# Patient Record
Sex: Male | Born: 1940 | Race: White | Hispanic: No | Smoking: Current every day smoker
Health system: Southern US, Community
[De-identification: ages and names within clinical notes are randomized; demographics above are authoritative.]

## PROBLEM LIST (undated history)

## (undated) DIAGNOSIS — I25118 Atherosclerotic heart disease of native coronary artery with other forms of angina pectoris: Secondary | ICD-10-CM

## (undated) DIAGNOSIS — I509 Heart failure, unspecified: Secondary | ICD-10-CM

## (undated) DIAGNOSIS — E782 Mixed hyperlipidemia: Secondary | ICD-10-CM

## (undated) DIAGNOSIS — N400 Enlarged prostate without lower urinary tract symptoms: Secondary | ICD-10-CM

## (undated) DIAGNOSIS — J449 Chronic obstructive pulmonary disease, unspecified: Secondary | ICD-10-CM

## (undated) DIAGNOSIS — Z955 Presence of coronary angioplasty implant and graft: Secondary | ICD-10-CM

## (undated) DIAGNOSIS — G720 Drug-induced myopathy: Secondary | ICD-10-CM

## (undated) DIAGNOSIS — T466X5A Adverse effect of antihyperlipidemic and antiarteriosclerotic drugs, initial encounter: Secondary | ICD-10-CM

## (undated) DIAGNOSIS — I10 Essential (primary) hypertension: Secondary | ICD-10-CM

## (undated) DIAGNOSIS — F17200 Nicotine dependence, unspecified, uncomplicated: Secondary | ICD-10-CM

## (undated) HISTORY — DX: Atherosclerotic heart disease of native coronary artery with other forms of angina pectoris: I25.118

## (undated) HISTORY — DX: Chronic obstructive pulmonary disease, unspecified: J44.9

## (undated) HISTORY — DX: Presence of coronary angioplasty implant and graft: Z95.5

## (undated) HISTORY — PX: CORONARY ANGIOPLASTY WITH STENT PLACEMENT: SHX49

## (undated) HISTORY — DX: Nicotine dependence, unspecified, uncomplicated: F17.200

## (undated) HISTORY — DX: Drug-induced myopathy: G72.0

## (undated) HISTORY — PX: BLADDER TUMOR EXCISION: SHX238

## (undated) HISTORY — DX: Adverse effect of antihyperlipidemic and antiarteriosclerotic drugs, initial encounter: T46.6X5A

## (undated) HISTORY — PX: PROSTATE SURGERY: SHX751

## (undated) HISTORY — DX: Mixed hyperlipidemia: E78.2

## (undated) HISTORY — DX: Benign prostatic hyperplasia without lower urinary tract symptoms: N40.0

## (undated) HISTORY — PX: TONSILLECTOMY: SUR1361

## (undated) HISTORY — DX: Essential (primary) hypertension: I10

---
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
## 2016-11-02 DIAGNOSIS — N401 Enlarged prostate with lower urinary tract symptoms: Secondary | ICD-10-CM | POA: Diagnosis not present

## 2016-11-02 DIAGNOSIS — R8299 Other abnormal findings in urine: Secondary | ICD-10-CM | POA: Diagnosis not present

## 2016-11-03 DIAGNOSIS — I2510 Atherosclerotic heart disease of native coronary artery without angina pectoris: Secondary | ICD-10-CM | POA: Diagnosis not present

## 2016-11-03 DIAGNOSIS — I252 Old myocardial infarction: Secondary | ICD-10-CM | POA: Diagnosis not present

## 2016-11-03 DIAGNOSIS — J441 Chronic obstructive pulmonary disease with (acute) exacerbation: Secondary | ICD-10-CM | POA: Diagnosis not present

## 2016-11-03 DIAGNOSIS — J439 Emphysema, unspecified: Secondary | ICD-10-CM | POA: Diagnosis not present

## 2016-11-03 DIAGNOSIS — R0600 Dyspnea, unspecified: Secondary | ICD-10-CM | POA: Diagnosis present

## 2016-11-03 DIAGNOSIS — F17210 Nicotine dependence, cigarettes, uncomplicated: Secondary | ICD-10-CM | POA: Diagnosis present

## 2016-11-03 DIAGNOSIS — R55 Syncope and collapse: Secondary | ICD-10-CM | POA: Diagnosis not present

## 2016-11-03 DIAGNOSIS — I159 Secondary hypertension, unspecified: Secondary | ICD-10-CM | POA: Diagnosis not present

## 2016-11-03 DIAGNOSIS — E785 Hyperlipidemia, unspecified: Secondary | ICD-10-CM | POA: Diagnosis not present

## 2016-11-03 DIAGNOSIS — I25119 Atherosclerotic heart disease of native coronary artery with unspecified angina pectoris: Secondary | ICD-10-CM | POA: Diagnosis present

## 2016-11-03 DIAGNOSIS — Z955 Presence of coronary angioplasty implant and graft: Secondary | ICD-10-CM | POA: Diagnosis not present

## 2016-11-03 DIAGNOSIS — R51 Headache: Secondary | ICD-10-CM | POA: Diagnosis not present

## 2016-11-03 DIAGNOSIS — R0789 Other chest pain: Secondary | ICD-10-CM | POA: Diagnosis not present

## 2016-11-03 DIAGNOSIS — Z720 Tobacco use: Secondary | ICD-10-CM | POA: Diagnosis not present

## 2016-11-03 DIAGNOSIS — I160 Hypertensive urgency: Secondary | ICD-10-CM | POA: Diagnosis not present

## 2016-11-03 DIAGNOSIS — R791 Abnormal coagulation profile: Secondary | ICD-10-CM | POA: Diagnosis not present

## 2016-11-03 DIAGNOSIS — I10 Essential (primary) hypertension: Secondary | ICD-10-CM | POA: Diagnosis not present

## 2016-11-03 DIAGNOSIS — R079 Chest pain, unspecified: Secondary | ICD-10-CM | POA: Diagnosis not present

## 2016-11-03 DIAGNOSIS — Z743 Need for continuous supervision: Secondary | ICD-10-CM | POA: Diagnosis not present

## 2016-11-03 DIAGNOSIS — N390 Urinary tract infection, site not specified: Secondary | ICD-10-CM | POA: Diagnosis not present

## 2016-11-03 DIAGNOSIS — R0602 Shortness of breath: Secondary | ICD-10-CM | POA: Diagnosis not present

## 2016-11-03 DIAGNOSIS — R8299 Other abnormal findings in urine: Secondary | ICD-10-CM | POA: Diagnosis present

## 2016-11-03 DIAGNOSIS — R609 Edema, unspecified: Secondary | ICD-10-CM | POA: Diagnosis present

## 2016-11-03 DIAGNOSIS — J449 Chronic obstructive pulmonary disease, unspecified: Secondary | ICD-10-CM | POA: Diagnosis not present

## 2016-11-12 DIAGNOSIS — I10 Essential (primary) hypertension: Secondary | ICD-10-CM | POA: Diagnosis not present

## 2016-11-12 DIAGNOSIS — E785 Hyperlipidemia, unspecified: Secondary | ICD-10-CM | POA: Diagnosis not present

## 2016-11-12 DIAGNOSIS — D494 Neoplasm of unspecified behavior of bladder: Secondary | ICD-10-CM | POA: Diagnosis not present

## 2016-11-16 DIAGNOSIS — E785 Hyperlipidemia, unspecified: Secondary | ICD-10-CM | POA: Diagnosis not present

## 2016-11-16 DIAGNOSIS — I213 ST elevation (STEMI) myocardial infarction of unspecified site: Secondary | ICD-10-CM | POA: Diagnosis not present

## 2016-11-16 DIAGNOSIS — I10 Essential (primary) hypertension: Secondary | ICD-10-CM | POA: Diagnosis not present

## 2016-11-16 DIAGNOSIS — I2510 Atherosclerotic heart disease of native coronary artery without angina pectoris: Secondary | ICD-10-CM | POA: Diagnosis not present

## 2016-12-04 DIAGNOSIS — Z955 Presence of coronary angioplasty implant and graft: Secondary | ICD-10-CM | POA: Diagnosis not present

## 2016-12-04 DIAGNOSIS — R0989 Other specified symptoms and signs involving the circulatory and respiratory systems: Secondary | ICD-10-CM | POA: Diagnosis not present

## 2016-12-04 DIAGNOSIS — Z79899 Other long term (current) drug therapy: Secondary | ICD-10-CM | POA: Diagnosis not present

## 2016-12-04 DIAGNOSIS — N401 Enlarged prostate with lower urinary tract symptoms: Secondary | ICD-10-CM | POA: Diagnosis not present

## 2016-12-04 DIAGNOSIS — J449 Chronic obstructive pulmonary disease, unspecified: Secondary | ICD-10-CM | POA: Diagnosis not present

## 2016-12-04 DIAGNOSIS — J984 Other disorders of lung: Secondary | ICD-10-CM | POA: Diagnosis not present

## 2016-12-04 DIAGNOSIS — I2510 Atherosclerotic heart disease of native coronary artery without angina pectoris: Secondary | ICD-10-CM | POA: Diagnosis present

## 2016-12-04 DIAGNOSIS — I10 Essential (primary) hypertension: Secondary | ICD-10-CM | POA: Diagnosis not present

## 2016-12-04 DIAGNOSIS — N411 Chronic prostatitis: Secondary | ICD-10-CM | POA: Diagnosis not present

## 2016-12-18 DIAGNOSIS — R310 Gross hematuria: Secondary | ICD-10-CM | POA: Diagnosis not present

## 2016-12-18 DIAGNOSIS — N400 Enlarged prostate without lower urinary tract symptoms: Secondary | ICD-10-CM | POA: Diagnosis not present

## 2016-12-18 DIAGNOSIS — R8299 Other abnormal findings in urine: Secondary | ICD-10-CM | POA: Diagnosis not present

## 2016-12-24 DIAGNOSIS — N400 Enlarged prostate without lower urinary tract symptoms: Secondary | ICD-10-CM | POA: Diagnosis not present

## 2016-12-24 DIAGNOSIS — R310 Gross hematuria: Secondary | ICD-10-CM | POA: Diagnosis not present

## 2016-12-24 DIAGNOSIS — R8299 Other abnormal findings in urine: Secondary | ICD-10-CM | POA: Diagnosis not present

## 2016-12-24 DIAGNOSIS — N401 Enlarged prostate with lower urinary tract symptoms: Secondary | ICD-10-CM | POA: Diagnosis not present

## 2017-01-07 DIAGNOSIS — Z716 Tobacco abuse counseling: Secondary | ICD-10-CM | POA: Diagnosis not present

## 2017-01-07 DIAGNOSIS — Z720 Tobacco use: Secondary | ICD-10-CM | POA: Diagnosis not present

## 2017-01-07 DIAGNOSIS — F17210 Nicotine dependence, cigarettes, uncomplicated: Secondary | ICD-10-CM | POA: Diagnosis not present

## 2017-01-07 DIAGNOSIS — J449 Chronic obstructive pulmonary disease, unspecified: Secondary | ICD-10-CM | POA: Diagnosis not present

## 2017-01-07 DIAGNOSIS — R0602 Shortness of breath: Secondary | ICD-10-CM | POA: Diagnosis not present

## 2017-01-07 DIAGNOSIS — J441 Chronic obstructive pulmonary disease with (acute) exacerbation: Secondary | ICD-10-CM | POA: Diagnosis not present

## 2017-01-07 DIAGNOSIS — I10 Essential (primary) hypertension: Secondary | ICD-10-CM | POA: Diagnosis not present

## 2017-01-29 DIAGNOSIS — I10 Essential (primary) hypertension: Secondary | ICD-10-CM | POA: Diagnosis not present

## 2017-01-29 DIAGNOSIS — J309 Allergic rhinitis, unspecified: Secondary | ICD-10-CM | POA: Diagnosis not present

## 2017-01-29 DIAGNOSIS — J439 Emphysema, unspecified: Secondary | ICD-10-CM | POA: Diagnosis not present

## 2017-03-12 DIAGNOSIS — R0600 Dyspnea, unspecified: Secondary | ICD-10-CM | POA: Diagnosis not present

## 2017-03-12 DIAGNOSIS — R0602 Shortness of breath: Secondary | ICD-10-CM | POA: Diagnosis not present

## 2017-03-12 DIAGNOSIS — I10 Essential (primary) hypertension: Secondary | ICD-10-CM | POA: Diagnosis not present

## 2017-03-12 DIAGNOSIS — J441 Chronic obstructive pulmonary disease with (acute) exacerbation: Secondary | ICD-10-CM | POA: Diagnosis not present

## 2017-03-12 DIAGNOSIS — R030 Elevated blood-pressure reading, without diagnosis of hypertension: Secondary | ICD-10-CM | POA: Diagnosis not present

## 2017-03-12 DIAGNOSIS — Z743 Need for continuous supervision: Secondary | ICD-10-CM | POA: Diagnosis not present

## 2017-03-12 DIAGNOSIS — F17210 Nicotine dependence, cigarettes, uncomplicated: Secondary | ICD-10-CM | POA: Diagnosis not present

## 2017-03-13 DIAGNOSIS — R5383 Other fatigue: Secondary | ICD-10-CM | POA: Diagnosis not present

## 2017-03-13 DIAGNOSIS — R5381 Other malaise: Secondary | ICD-10-CM | POA: Diagnosis not present

## 2017-03-13 DIAGNOSIS — R0981 Nasal congestion: Secondary | ICD-10-CM | POA: Diagnosis not present

## 2017-03-13 DIAGNOSIS — R0602 Shortness of breath: Secondary | ICD-10-CM | POA: Diagnosis not present

## 2017-03-13 DIAGNOSIS — R0989 Other specified symptoms and signs involving the circulatory and respiratory systems: Secondary | ICD-10-CM | POA: Diagnosis not present

## 2017-03-13 DIAGNOSIS — J439 Emphysema, unspecified: Secondary | ICD-10-CM | POA: Diagnosis not present

## 2017-03-22 DIAGNOSIS — I10 Essential (primary) hypertension: Secondary | ICD-10-CM | POA: Diagnosis not present

## 2017-03-22 DIAGNOSIS — E785 Hyperlipidemia, unspecified: Secondary | ICD-10-CM | POA: Diagnosis not present

## 2017-03-22 DIAGNOSIS — J40 Bronchitis, not specified as acute or chronic: Secondary | ICD-10-CM | POA: Diagnosis not present

## 2017-03-22 DIAGNOSIS — I2510 Atherosclerotic heart disease of native coronary artery without angina pectoris: Secondary | ICD-10-CM | POA: Diagnosis not present

## 2017-05-04 DIAGNOSIS — Z743 Need for continuous supervision: Secondary | ICD-10-CM | POA: Diagnosis not present

## 2017-05-04 DIAGNOSIS — J441 Chronic obstructive pulmonary disease with (acute) exacerbation: Secondary | ICD-10-CM | POA: Diagnosis not present

## 2017-05-04 DIAGNOSIS — R0600 Dyspnea, unspecified: Secondary | ICD-10-CM | POA: Diagnosis not present

## 2017-05-04 DIAGNOSIS — Z720 Tobacco use: Secondary | ICD-10-CM | POA: Diagnosis not present

## 2017-05-04 DIAGNOSIS — R0602 Shortness of breath: Secondary | ICD-10-CM | POA: Diagnosis not present

## 2017-05-04 DIAGNOSIS — I252 Old myocardial infarction: Secondary | ICD-10-CM | POA: Diagnosis not present

## 2017-05-04 DIAGNOSIS — R071 Chest pain on breathing: Secondary | ICD-10-CM | POA: Diagnosis not present

## 2017-05-04 DIAGNOSIS — I10 Essential (primary) hypertension: Secondary | ICD-10-CM | POA: Diagnosis not present

## 2017-09-30 DIAGNOSIS — J439 Emphysema, unspecified: Secondary | ICD-10-CM | POA: Diagnosis not present

## 2017-09-30 DIAGNOSIS — I10 Essential (primary) hypertension: Secondary | ICD-10-CM | POA: Diagnosis not present

## 2017-12-25 DIAGNOSIS — I10 Essential (primary) hypertension: Secondary | ICD-10-CM | POA: Diagnosis not present

## 2017-12-25 DIAGNOSIS — J439 Emphysema, unspecified: Secondary | ICD-10-CM | POA: Diagnosis not present

## 2017-12-25 DIAGNOSIS — R350 Frequency of micturition: Secondary | ICD-10-CM | POA: Diagnosis not present

## 2018-01-22 DIAGNOSIS — Z13220 Encounter for screening for lipoid disorders: Secondary | ICD-10-CM | POA: Diagnosis not present

## 2018-01-22 DIAGNOSIS — R5381 Other malaise: Secondary | ICD-10-CM | POA: Diagnosis not present

## 2018-01-22 DIAGNOSIS — J439 Emphysema, unspecified: Secondary | ICD-10-CM | POA: Diagnosis not present

## 2018-01-22 DIAGNOSIS — N401 Enlarged prostate with lower urinary tract symptoms: Secondary | ICD-10-CM | POA: Diagnosis not present

## 2018-01-22 DIAGNOSIS — K068 Other specified disorders of gingiva and edentulous alveolar ridge: Secondary | ICD-10-CM | POA: Diagnosis not present

## 2018-01-22 DIAGNOSIS — R5383 Other fatigue: Secondary | ICD-10-CM | POA: Diagnosis not present

## 2018-01-22 DIAGNOSIS — K921 Melena: Secondary | ICD-10-CM | POA: Diagnosis not present

## 2018-01-22 DIAGNOSIS — I10 Essential (primary) hypertension: Secondary | ICD-10-CM | POA: Diagnosis not present

## 2018-01-30 DIAGNOSIS — I10 Essential (primary) hypertension: Secondary | ICD-10-CM | POA: Diagnosis not present

## 2018-01-30 DIAGNOSIS — I442 Atrioventricular block, complete: Secondary | ICD-10-CM | POA: Diagnosis not present

## 2018-01-30 DIAGNOSIS — R079 Chest pain, unspecified: Secondary | ICD-10-CM | POA: Diagnosis not present

## 2018-01-30 DIAGNOSIS — I2510 Atherosclerotic heart disease of native coronary artery without angina pectoris: Secondary | ICD-10-CM | POA: Diagnosis not present

## 2018-02-18 DIAGNOSIS — Z9981 Dependence on supplemental oxygen: Secondary | ICD-10-CM | POA: Diagnosis not present

## 2018-02-18 DIAGNOSIS — R918 Other nonspecific abnormal finding of lung field: Secondary | ICD-10-CM | POA: Diagnosis not present

## 2018-02-18 DIAGNOSIS — J181 Lobar pneumonia, unspecified organism: Secondary | ICD-10-CM | POA: Diagnosis not present

## 2018-02-18 DIAGNOSIS — J449 Chronic obstructive pulmonary disease, unspecified: Secondary | ICD-10-CM | POA: Diagnosis not present

## 2018-02-18 DIAGNOSIS — R0602 Shortness of breath: Secondary | ICD-10-CM | POA: Diagnosis not present

## 2018-02-18 DIAGNOSIS — R0600 Dyspnea, unspecified: Secondary | ICD-10-CM | POA: Diagnosis not present

## 2018-02-24 DIAGNOSIS — R911 Solitary pulmonary nodule: Secondary | ICD-10-CM | POA: Diagnosis not present

## 2018-02-24 DIAGNOSIS — J439 Emphysema, unspecified: Secondary | ICD-10-CM | POA: Diagnosis not present

## 2018-02-24 DIAGNOSIS — D6859 Other primary thrombophilia: Secondary | ICD-10-CM | POA: Diagnosis not present

## 2018-02-24 DIAGNOSIS — R195 Other fecal abnormalities: Secondary | ICD-10-CM | POA: Diagnosis not present

## 2018-03-26 DIAGNOSIS — R911 Solitary pulmonary nodule: Secondary | ICD-10-CM | POA: Diagnosis not present

## 2018-04-02 DIAGNOSIS — R3912 Poor urinary stream: Secondary | ICD-10-CM | POA: Diagnosis not present

## 2018-04-02 DIAGNOSIS — R34 Anuria and oliguria: Secondary | ICD-10-CM | POA: Diagnosis not present

## 2018-04-20 DIAGNOSIS — I2510 Atherosclerotic heart disease of native coronary artery without angina pectoris: Secondary | ICD-10-CM | POA: Diagnosis not present

## 2018-04-20 DIAGNOSIS — J449 Chronic obstructive pulmonary disease, unspecified: Secondary | ICD-10-CM | POA: Diagnosis not present

## 2018-04-20 DIAGNOSIS — Z9981 Dependence on supplemental oxygen: Secondary | ICD-10-CM | POA: Diagnosis not present

## 2018-04-20 DIAGNOSIS — R0602 Shortness of breath: Secondary | ICD-10-CM | POA: Diagnosis not present

## 2018-04-20 DIAGNOSIS — I252 Old myocardial infarction: Secondary | ICD-10-CM | POA: Diagnosis not present

## 2018-04-20 DIAGNOSIS — F17218 Nicotine dependence, cigarettes, with other nicotine-induced disorders: Secondary | ICD-10-CM | POA: Diagnosis not present

## 2018-04-20 DIAGNOSIS — Z743 Need for continuous supervision: Secondary | ICD-10-CM | POA: Diagnosis not present

## 2018-04-20 DIAGNOSIS — R079 Chest pain, unspecified: Secondary | ICD-10-CM | POA: Diagnosis not present

## 2018-04-20 DIAGNOSIS — J441 Chronic obstructive pulmonary disease with (acute) exacerbation: Secondary | ICD-10-CM | POA: Diagnosis not present

## 2018-08-26 ENCOUNTER — Encounter: Payer: Self-pay | Admitting: Osteopathic Medicine

## 2018-08-26 ENCOUNTER — Ambulatory Visit (INDEPENDENT_AMBULATORY_CARE_PROVIDER_SITE_OTHER): Payer: Medicare Other | Admitting: Osteopathic Medicine

## 2018-08-26 VITALS — BP 179/75 | HR 70 | Temp 98.1°F | Ht 69.0 in | Wt 186.0 lb

## 2018-08-26 DIAGNOSIS — E782 Mixed hyperlipidemia: Secondary | ICD-10-CM

## 2018-08-26 DIAGNOSIS — Z955 Presence of coronary angioplasty implant and graft: Secondary | ICD-10-CM | POA: Diagnosis not present

## 2018-08-26 DIAGNOSIS — R918 Other nonspecific abnormal finding of lung field: Secondary | ICD-10-CM | POA: Diagnosis not present

## 2018-08-26 DIAGNOSIS — T466X5A Adverse effect of antihyperlipidemic and antiarteriosclerotic drugs, initial encounter: Secondary | ICD-10-CM | POA: Diagnosis not present

## 2018-08-26 DIAGNOSIS — F17200 Nicotine dependence, unspecified, uncomplicated: Secondary | ICD-10-CM | POA: Insufficient documentation

## 2018-08-26 DIAGNOSIS — J449 Chronic obstructive pulmonary disease, unspecified: Secondary | ICD-10-CM

## 2018-08-26 DIAGNOSIS — J4489 Other specified chronic obstructive pulmonary disease: Secondary | ICD-10-CM | POA: Insufficient documentation

## 2018-08-26 DIAGNOSIS — I25118 Atherosclerotic heart disease of native coronary artery with other forms of angina pectoris: Secondary | ICD-10-CM | POA: Insufficient documentation

## 2018-08-26 DIAGNOSIS — I10 Essential (primary) hypertension: Secondary | ICD-10-CM | POA: Diagnosis not present

## 2018-08-26 DIAGNOSIS — R911 Solitary pulmonary nodule: Secondary | ICD-10-CM | POA: Insufficient documentation

## 2018-08-26 DIAGNOSIS — H029 Unspecified disorder of eyelid: Secondary | ICD-10-CM | POA: Diagnosis not present

## 2018-08-26 DIAGNOSIS — G720 Drug-induced myopathy: Secondary | ICD-10-CM | POA: Diagnosis not present

## 2018-08-26 HISTORY — DX: Other specified chronic obstructive pulmonary disease: J44.89

## 2018-08-26 HISTORY — DX: Presence of coronary angioplasty implant and graft: Z95.5

## 2018-08-26 HISTORY — DX: Essential (primary) hypertension: I10

## 2018-08-26 HISTORY — DX: Chronic obstructive pulmonary disease, unspecified: J44.9

## 2018-08-26 HISTORY — DX: Atherosclerotic heart disease of native coronary artery with other forms of angina pectoris: I25.118

## 2018-08-26 HISTORY — DX: Mixed hyperlipidemia: E78.2

## 2018-08-26 HISTORY — DX: Nicotine dependence, unspecified, uncomplicated: F17.200

## 2018-08-26 HISTORY — DX: Drug-induced myopathy: T46.6X5A

## 2018-08-26 HISTORY — DX: Drug-induced myopathy: G72.0

## 2018-08-26 MED ORDER — CARVEDILOL 12.5 MG PO TABS: 12.5000 mg | ORAL_TABLET | Freq: Two times a day (BID) | ORAL | 3 refills | Status: DC

## 2018-08-26 MED ORDER — FUROSEMIDE 20 MG PO TABS: 20.0000 mg | ORAL_TABLET | Freq: Every day | ORAL | 3 refills | Status: DC

## 2018-08-26 NOTE — Patient Instructions (Addendum)
Plan:  CT chest ordered, when you go in for the scan please being your disc form your previous imaging study   Referral to cardiology   Referral to ophthalmology (eye doctor for eyelid)   Refills taken care of for furosemide and carvedilol  When due for refills of other medications, please contact your pharmacy to send [place] a request, call us if there are any problems with this process  I'll review records this week and if we need to make any updates, I'll let you know!   Next time you see me or the cardiologist, please being your home blood pressure monitor to that visit soit can be verified

## 2018-08-26 NOTE — Progress Notes (Signed)
HPI: Christopher Armstrong is a 77 y.o. male who  has no past medical history on file.  he presents to [facility] today, 08/26/18,  for chief complaint of: New to establish care - see headings below  COPD Declines flu vaccine, pneumonia vaccines Takes Symbicort 160 mg / 4.5 mg 2 puffs twice daily, ipratropium/albuterol nebulizer 4 times daily and as needed, Ventolin emergency inhaler as needed, 2 L oxygen as needed/sleeping.    Cardiac: HTN, HLD, CAD  History of MI Reports whitecoat hypertension Current medications include: Prasugrel 10 mg daily, furosemide 20 mg daily, carvedilol 12.5 mg twice daily, Nitrostat 0.4 mg as needed  Reports a history of statin intolerance, was also previously on Plavix which caused "purple blood pockets in the mouth", Brilinta caused breathing problems  Spot on R eyelid - would like this checked out and possibly removed.    Patient is accompanied by sister, Malachy Mood, who assists with history-taking.     Past medical, surgical, social and family history reviewed:  Patient Active Problem List   Diagnosis Date Noted  . COPD (chronic obstructive pulmonary disease) with chronic bronchitis ([place]) 08/26/2018  . Coronary artery disease of native artery of native heart with stable angina pectoris ([place]) 08/26/2018  . Mixed hyperlipidemia 08/26/2018  . Essential hypertension 08/26/2018  . 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 Tobacco use disorder, severe, dependence 08/26/2018  . History of coronary artery stent placement 08/26/2018  . White coat syndrome with hypertension 08/26/2018  . Eyelid abnormality 08/26/2018  . Lung nodule 08/26/2018  . Abnormal findings on diagnostic imaging of lung 08/26/2018    Past Surgical History:  Procedure Laterality Date  . BLADDER TUMOR EXCISION    . CORONARY ANGIOPLASTY WITH STENT PLACEMENT    . TONSILLECTOMY      Social History   Tobacco Use  . Smoking status: Current Every Day Smoker  .  Smokeless tobacco: Never Used  Substance Use Topics  . Alcohol use: Yes    Alcohol/week: 1.0 standard drinks    Types: 1 Cans of beer per week    No family history on file.   Current medication list and allergy/intolerance information reviewed:    Current Outpatient Medications  Medication Sig Dispense Refill  . albuterol (PROVENTIL HFA;VENTOLIN HFA) 108 (90 Base) MCG/ACT inhaler Inhale into the lungs every 6 (six) hours as needed for wheezing or shortness of breath.    . budesonide-formoterol (SYMBICORT) 160-4.5 MCG/ACT inhaler Inhale 2 puffs into the lungs 2 (two) times daily.    . carvedilol (COREG) 12.5 MG tablet Take 1 tablet (12.5 mg total) by mouth 2 (two) times daily. 180 tablet 3  . furosemide (LASIX) 20 MG tablet Take 1 tablet (20 mg total) by mouth daily. 90 tablet 3  . ipratropium-albuterol (DUONEB) 0.5-2.5 (3) MG/3ML SOLN Take 3 mLs by nebulization 4 (four) times daily.    . nitroGLYCERIN (NITROSTAT) 0.4 MG SL tablet Place 0.4 mg under the tongue every 5 (five) minutes as needed for chest pain.    . OXYGEN Inhale into the lungs at bedtime.    . prasugrel (EFFIENT) 10 MG TABS tablet Take 10 mg by mouth daily.     No current facility-administered medications for this visit.     Allergies  Allergen Reactions  . Plavix [Clopidogrel] Other (See Comments)    "Purple blood pockets clots in mouth"  . Ticagrelor Shortness Of Breath  . Atorvastatin   . Simvastatin       Review of Systems:  Constitutional:  No  fever, no chills, No recent illness, No unintentional weight changes. No significant fatigue.   HEENT: No  headache, no vision change, no hearing change, No sore throat, No  sinus pressure  Cardiac: No  chest pain, No  pressure, No palpitations, No  Orthopnea, +Le swelling   Respiratory:  +chronic cough and shortness of breath w/ occasional productive cough  Gastrointestinal: No  abdominal pain, No  nausea, No  vomiting,  No  blood in stool, No  diarrhea, No   constipation   Musculoskeletal: No new myalgia/arthralgia  Skin: No  Rash, No other wounds/concerning lesions  Genitourinary: No  incontinence, No  abnormal genital bleeding, No abnormal genital discharge  Hem/Onc: +easy bruising/bleeding, No  abnormal lymph node  Endocrine: No cold intolerance,  No heat intolerance. No polyuria/polydipsia/polyphagia   Neurologic: No  weakness, No  dizziness, No  slurred speech/focal weakness/facial droop  Psychiatric: No  concerns with depression, No  concerns with anxiety, No sleep problems, No mood problems  Exam:  BP (!) 179/75 (BP Location: Left Arm, Patient Position: Sitting, Cuff Size: Normal)   Pulse 70   Temp 98.1 F (36.7 C) (Oral)   Ht 5\' 9"  (1.753 m)   Wt 186 lb (84.4 kg)   SpO2 96%   BMI 27.47 kg/m   Constitutional: VS see above. General Appearance: alert, well-developed, well-nourished, NAD  Eyes: Normal conjunctive, non-icteric sclera, small reddish nodule R lower eyelid at lashes, <0.5 cm, not consistent w/ stue/hordeolum   Ears, Nose, Mouth, Throat: MMM, Normal external inspection ears/nares/mouth/lips/gums.   Neck: No masses, trachea midline. No thyroid enlargement. No tenderness/mass appreciated. No lymphadenopathy  Respiratory: Normal respiratory effort. no wheeze, no rhonchi, no rales, coarse breath sounds and diminished sounds bilaterally   Cardiovascular: S1/S2 normal, +faint systolic murmur, no rub/gallop auscultated. RRR. No lower extremity edema.   Gastrointestinal: Nontender, no masses. No hepatomegaly, no splenomegaly. No hernia appreciated. Bowel sounds normal. Rectal exam deferred.   Musculoskeletal: Gait normal. No clubbing/cyanosis of digits.   Neurological: Normal balance/coordination. No tremor. No cranial nerve deficit on limited exam. Motor and sensation intact and symmetric. Cerebellar reflexes intact.   Skin: warm, dry, intact. No rash/ulcer. No concerning nevi or subq nodules on limited exam.     Psychiatric: Normal judgment/insight. Normal mood and affect. Oriented x3.       ASSESSMENT/PLAN:   Coronary artery disease of native artery of native heart with stable angina pectoris ([place]) - Plan: Ambulatory referral to Cardiology  COPD (chronic obstructive pulmonary disease) with chronic bronchitis ([place])  Mixed hyperlipidemia - Plan: Ambulatory referral to Cardiology  Essential hypertension - Plan: Ambulatory referral to Cardiology  Statin myopathy - Plan: Ambulatory referral to Cardiology  Tobacco use disorder, severe, dependence  History of coronary artery stent placement - Plan: Ambulatory referral to Cardiology  Abnormal findings on diagnostic imaging of lung - Plan: CT Chest Wo Contrast  Lung nodule - Plan: CT Chest Wo Contrast  Eyelid abnormality - Plan: Ambulatory referral to Ophthalmology  White coat syndrome with hypertension    Orders Placed This Encounter  Procedures  . CT Chest Wo Contrast  . Ambulatory referral to Ophthalmology  . Ambulatory referral to Cardiology    Meds ordered this encounter  Medications  . furosemide (LASIX) 20 MG tablet    Sig: Take 1 tablet (20 mg total) by mouth daily.    Dispense:  90 tablet    Refill:  3  . carvedilol (COREG) 12.5 MG tablet    Sig:  Take 1 tablet (12.5 mg total) by mouth 2 (two) times daily.    Dispense:  180 tablet    Refill:  3    Patient Instructions  Plan:  CT chest ordered, when you go in for the scan please being your disc form your previous imaging study   Referral to cardiology   Referral to ophthalmology (eye doctor for eyelid)   Refills taken care of for furosemide and carvedilol  When due for refills of other medications, please contact your pharmacy to send [place] a request, call us if there are any problems with this process  I'll review records this week and if we need to make any updates, I'll let you know!   Next time you see me or the cardiologist, please being your home blood  pressure monitor to that visit soit can be verified             Visit summary with medication list and pertinent instructions was printed for patient to review. All questions at time of visit were answered - patient instructed to contact office with any additional concerns. ER/RTC precautions were reviewed with the patient.   Follow-up plan: Return in about 3 months (around 11/26/2018), or sooner if needed, for recheck cardiac and respiratory issues - if stable can see me every 6 months / as needed after that .  Note: Total time spent 60 minutes, greater than 50% of the visit was spent face-to-face counseling and coordinating care for the following: The primary encounter diagnosis was Coronary artery disease of native artery of native heart with stable angina pectoris ([place]). Diagnoses of COPD (chronic obstructive pulmonary disease) with chronic bronchitis ([place]), Mixed hyperlipidemia, Essential hypertension, Statin myopathy, Tobacco use disorder, severe, dependence, History of coronary artery stent placement, Abnormal findings on diagnostic imaging of lung, Lung nodule, Eyelid abnormality, and White coat syndrome with hypertension were also pertinent to this visit.Marland Kitchen  Please note: voice recognition software was used to produce this document, and typos may escape review. Please contact Dr. Sheppard Coil for any needed clarifications.

## 2018-08-28 ENCOUNTER — Ambulatory Visit (INDEPENDENT_AMBULATORY_CARE_PROVIDER_SITE_OTHER): Payer: Medicare Other

## 2018-08-28 DIAGNOSIS — R918 Other nonspecific abnormal finding of lung field: Secondary | ICD-10-CM | POA: Diagnosis not present

## 2018-08-28 DIAGNOSIS — J439 Emphysema, unspecified: Secondary | ICD-10-CM | POA: Diagnosis not present

## 2018-08-28 DIAGNOSIS — R911 Solitary pulmonary nodule: Secondary | ICD-10-CM

## 2018-08-28 DIAGNOSIS — I700 Atherosclerosis of aorta: Secondary | ICD-10-CM

## 2018-08-28 DIAGNOSIS — J432 Centrilobular emphysema: Secondary | ICD-10-CM | POA: Diagnosis not present

## 2018-08-29 ENCOUNTER — Ambulatory Visit
Admission: RE | Admit: 2018-08-29 | Discharge: 2018-08-29 | Disposition: A | Discharge status: Home / Self Care | Payer: Self-pay | Source: Ambulatory Visit | Attending: Osteopathic Medicine | Admitting: Osteopathic Medicine

## 2018-08-29 ENCOUNTER — Other Ambulatory Visit: Payer: Self-pay | Admitting: Osteopathic Medicine

## 2018-08-29 DIAGNOSIS — R911 Solitary pulmonary nodule: Secondary | ICD-10-CM

## 2018-09-05 ENCOUNTER — Telehealth: Payer: Self-pay | Admitting: Osteopathic Medicine

## 2018-09-05 DIAGNOSIS — R918 Other nonspecific abnormal finding of lung field: Secondary | ICD-10-CM

## 2018-09-05 DIAGNOSIS — R911 Solitary pulmonary nodule: Secondary | ICD-10-CM

## 2018-09-05 NOTE — Telephone Encounter (Signed)
CT chest 03/26/2018 Multiple focal irregular density scattered within the lungs bilaterally, largest 0.9 cm, inflammatory versus infectious, malignancy/metastatic disease less likely but not excluded, recommended follow-up chest CT in 8 to 10 weeks.  Emphysematous changes of lungs, coronary artery and thoracic aortic atherosclerosis.  2.1 cm hypodense lesion with peripheral enhancement in the liver suggestive of hemangioma  Last cardiology visit 01/30/2018 Coronary artery disease, holding aspirin, continuing Effient, history of oral bleeding and lower GI bleed.  Continue Coreg 25 mg twice daily, furosemide 20 mg daily, patient is statin intolerant.  Nitrostat as needed.  Operative report February 2018 TURP   Transthoracic echocardiogram 03/20/2016: Normal LV, concentric LVH, diastolic function could not be assessed, LVEF 60 to 65%  Discharge summary: June 2017, acute MI, right coronary artery stenting x3, third-degree AV block, status post temporary pacemaker, AKI.   Cardiac catheterization report 03/20/2016 for STEMI.  Intervention on proximal, mid, distal RCA with drug-eluting stents.  Temporary pacemaker placement.  Discharge summary January 2018, angina and acute exacerbation of COPD  Labs: 04/02/2018 PSA 0.38 04/02/2018 creatinine 1.0,

## 2018-09-08 ENCOUNTER — Telehealth: Payer: Self-pay | Admitting: Osteopathic Medicine

## 2018-09-08 NOTE — Telephone Encounter (Signed)
Patient has scheduled an appointment to come in to discuss paperwork from [place]..please hold on to paperwork.  Thanks

## 2018-09-09 NOTE — Telephone Encounter (Signed)
Thank you :)

## 2018-09-09 NOTE — Telephone Encounter (Signed)
Forwarding to provider for review.

## 2018-09-18 ENCOUNTER — Encounter: Payer: Self-pay | Admitting: Osteopathic Medicine

## 2018-09-18 ENCOUNTER — Ambulatory Visit (INDEPENDENT_AMBULATORY_CARE_PROVIDER_SITE_OTHER): Payer: Medicare Other | Admitting: Osteopathic Medicine

## 2018-09-18 DIAGNOSIS — J449 Chronic obstructive pulmonary disease, unspecified: Secondary | ICD-10-CM

## 2018-09-18 DIAGNOSIS — I10 Essential (primary) hypertension: Secondary | ICD-10-CM

## 2018-09-18 DIAGNOSIS — F17209 Nicotine dependence, unspecified, with unspecified nicotine-induced disorders: Secondary | ICD-10-CM

## 2018-09-18 DIAGNOSIS — I25118 Atherosclerotic heart disease of native coronary artery with other forms of angina pectoris: Secondary | ICD-10-CM

## 2018-09-18 DIAGNOSIS — N400 Enlarged prostate without lower urinary tract symptoms: Secondary | ICD-10-CM

## 2018-09-18 MED ORDER — IPRATROPIUM-ALBUTEROL 0.5-2.5 (3) MG/3ML IN SOLN: 3.0000 mL | Freq: Four times a day (QID) | RESPIRATORY_TRACT | 99 refills | Status: DC

## 2018-09-18 NOTE — Patient Instructions (Addendum)
   Prescription for the nebulizer medicine was sent to [facility]  Sample given for Symbicort 160/4.5   I messaged our referral coordinator to look into why you have not heard from the pulmonology office.  I placed the order for referral 09/03/2018.  I messaged our triage nurse to look into why you have not heard back about the PET scan.  I placed that order 09/03/2018.   If you do not hear back by Monday about the referrals for pulmonology or for the PET scan, please call us at [phone] and asked to be connected to the triage nurse.    I will work on a letter to the [place] and I will also work on compiling some additional research to support your claim.  I am not confident that your illnesses were solely due to agent orange, but there may be an association.

## 2018-09-18 NOTE — Progress Notes (Signed)
HPI: Christopher Armstrong is a 77 y.o. male who  has a past medical history of COPD (chronic obstructive pulmonary disease) with chronic bronchitis ([place]) (08/26/2018), Coronary artery disease of native artery of native heart with stable angina pectoris ([place]) (08/26/2018), Essential hypertension (08/26/2018), History of coronary artery stent placement (08/26/2018), Mixed hyperlipidemia (08/26/2018), Statin myopathy (08/26/2018), and Tobacco use disorder, severe, dependence (08/26/2018).  he presents to [facility] today, 09/18/18,  for chief complaint of:  Requests letter  Patient is requesting assistance from the [place] with regard to diagnoses of BPH and COPD.  He brings a stack of studies on this topic with regard to exposure to dioxin/agent orange.  He is requesting a letter from need to support his case today.  Social History   Tobacco Use  Smoking Status Current Every Day Smoker  Smokeless Tobacco Never Used   Reports whitecoat hypertension, blood pressure is in the 130s at home.  Patient is advised to bring home blood pressure cuff to the office for next visit  Patient is accompanied by sister, Malachy Mood, who assists with history-taking.      At today's visit... Past medical history, surgical history, and family history reviewed and updated as needed.  Current medication list and allergy/intolerance information reviewed and updated as needed. (See remainder of HPI, ROS, Phys Exam below)          ASSESSMENT/PLAN: Diagnoses of COPD (chronic obstructive pulmonary disease) with chronic bronchitis ([place]), Benign prostatic hyperplasia, unspecified whether lower urinary tract symptoms present, Tobacco use disorder, continuous, Essential hypertension, and White coat syndrome with hypertension were pertinent to this visit.   Orders Placed This Encounter  Procedures  . Ambulatory referral to Pulmonology     Meds ordered this encounter  Medications  .  DISCONTD: ipratropium-albuterol (DUONEB) 0.5-2.5 (3) MG/3ML SOLN    Sig: Take 3 mLs by nebulization 4 (four) times daily.    Dispense:  360 mL    Refill:  99  . ipratropium-albuterol (DUONEB) 0.5-2.5 (3) MG/3ML SOLN    Sig: Take 3 mLs by nebulization 4 (four) times daily.    Dispense:  360 mL    Refill:  99    Fax to [place]    Patient Instructions   Prescription for the nebulizer medicine was sent to [facility]  Sample given for Symbicort 160/4.5   I messaged our referral coordinator to look into why you have not heard from the pulmonology office.  I placed the order for referral 09/03/2018.  I messaged our triage nurse to look into why you have not heard back about the PET scan.  I placed that order 09/03/2018.   If you do not hear back by Monday about the referrals for pulmonology or for the PET scan, please call us at [phone] and asked to be connected to the triage nurse.    I will work on a letter to the [place] and I will also work on compiling some additional research to support your claim.  I am not confident that your illnesses were solely due to agent orange, but there may be an association.      Follow-up plan: Return in about 6 months (around 03/20/2019) for medicare wellness visit - sooner if needed.                             ############################################ ############################################ ############################################ ############################################    Current Meds  Medication Sig  . albuterol (PROVENTIL  HFA;VENTOLIN HFA) 108 (90 Base) MCG/ACT inhaler Inhale into the lungs every 6 (six) hours as needed for wheezing or shortness of breath.  . budesonide-formoterol (SYMBICORT) 160-4.5 MCG/ACT inhaler Inhale 2 puffs into the lungs 2 (two) times daily.  . carvedilol (COREG) 12.5 MG tablet Take 1 tablet (12.5 mg total) by mouth 2 (two) times daily.  . furosemide (LASIX) 20 MG  tablet Take 1 tablet (20 mg total) by mouth daily.  Marland Kitchen ipratropium-albuterol (DUONEB) 0.5-2.5 (3) MG/3ML SOLN Take 3 mLs by nebulization 4 (four) times daily.  . nitroGLYCERIN (NITROSTAT) 0.4 MG SL tablet Place 0.4 mg under the tongue every 5 (five) minutes as needed for chest pain.  . OXYGEN Inhale into the lungs at bedtime.  . prasugrel (EFFIENT) 10 MG TABS tablet Take 10 mg by mouth daily.  . [DISCONTINUED] ipratropium-albuterol (DUONEB) 0.5-2.5 (3) MG/3ML SOLN Take 3 mLs by nebulization 4 (four) times daily.  . [DISCONTINUED] ipratropium-albuterol (DUONEB) 0.5-2.5 (3) MG/3ML SOLN Take 3 mLs by nebulization 4 (four) times daily.    Allergies  Allergen Reactions  . Plavix [Clopidogrel] Other (See Comments)    "Purple blood pockets clots in mouth"  . Ticagrelor Shortness Of Breath  . Atorvastatin   . Simvastatin        Review of Systems:  Constitutional: No recent illness  Cardiac: No  chest pain, No  pressure, No palpitations  Respiratory:  +chronic shortness of breath. +Cough  Gastrointestinal: No  abdominal pain, no change on bowel habits  Neurologic: No  weakness, No  Dizziness  Psychiatric: No  concerns with depression, No  concerns with anxiety  Exam:  BP (!) 189/70   Pulse 73   Temp (!) 97.1 F (36.2 C) (Oral)   Wt 186 lb (84.4 kg)   BMI 27.47 kg/m   Constitutional: VS see above. General Appearance: alert, well-developed, well-nourished, NAD  Eyes: Normal lids and conjunctive, non-icteric sclera  Ears, Nose, Mouth, Throat: MMM, Normal external inspection ears/nares/mouth/lips/gums.  Neck: No masses, trachea midline.   Respiratory: Normal respiratory effort. no wheeze, no rhonchi, no rales  Cardiovascular: S1/S2 normal, no murmur, no rub/gallop auscultated. RRR.   Musculoskeletal: Gait normal. Symmetric and independent movement of all extremities  Neurological: Normal balance/coordination. No tremor.  Skin: warm, dry, intact.   Psychiatric: Normal  judgment/insight. Normal mood and affect. Oriented x3.      Visit summary with medication list and pertinent instructions was printed for patient to review, patient was advised to alert [place] if any updates are needed. All questions at time of visit were answered - patient instructed to contact office with any additional concerns. ER/RTC precautions were reviewed with the patient and understanding verbalized.   Note: Total time spent 40 minutes, greater than 50% of the visit was spent face-to-face counseling and coordinating care for the following: Diagnoses of COPD (chronic obstructive pulmonary disease) with chronic bronchitis ([place]), Benign prostatic hyperplasia, unspecified whether lower urinary tract symptoms present, Tobacco use disorder, continuous, Essential hypertension, and White coat syndrome with hypertension were pertinent to this visit.Marland Kitchen  Please note: voice recognition software was used to produce this document, and typos may escape review. Please contact Dr. Sheppard Coil for any needed clarifications.    Follow up plan: Return in about 6 months (around 03/20/2019) for medicare wellness visit - sooner if needed.

## 2018-09-19 ENCOUNTER — Encounter: Payer: Self-pay | Admitting: Osteopathic Medicine

## 2018-09-19 NOTE — Addendum Note (Signed)
Addended by: Alena Bills R on: 09/19/2018 09:05 AM   Modules accepted: Orders

## 2018-09-21 ENCOUNTER — Other Ambulatory Visit: Payer: Self-pay

## 2018-09-21 ENCOUNTER — Emergency Department (INDEPENDENT_AMBULATORY_CARE_PROVIDER_SITE_OTHER)
Admission: EM | Admit: 2018-09-21 | Discharge: 2018-09-21 | Disposition: A | Discharge status: Home / Self Care | Payer: Medicare Other | Source: Home / Self Care | Attending: Family Medicine | Admitting: Family Medicine

## 2018-09-21 ENCOUNTER — Encounter: Payer: Self-pay | Admitting: *Deleted

## 2018-09-21 DIAGNOSIS — H8113 Benign paroxysmal vertigo, bilateral: Secondary | ICD-10-CM

## 2018-09-21 DIAGNOSIS — I10 Essential (primary) hypertension: Secondary | ICD-10-CM

## 2018-09-21 MED ORDER — MECLIZINE HCL 12.5 MG PO TABS: ORAL_TABLET | ORAL | 1 refills | Status: DC

## 2018-09-21 NOTE — ED Triage Notes (Signed)
Patient c/o fluctuations in BP x 2 days up to 379 systolic. C/o weakness and dizziness with movement. H/o MI. Denies CP or SOB. EKG showed NSR.

## 2018-09-21 NOTE — Discharge Instructions (Addendum)
May increase carvedilol 12.5mg  to one tab every 8 hours.  Monitor blood pressure more frequently at different times of day and record on a calendar.  If symptoms become significantly worse during the night or over the weekend, proceed to the local emergency room.

## 2018-09-21 NOTE — ED Provider Notes (Signed)
Christopher Armstrong CARE    CSN: [id number] Arrival date & time: 09/21/18  1452     History   Chief Complaint Chief Complaint  Patient presents with  . Dizziness  . Hypertension  . Weakness    HPI Christopher Armstrong is a 77 y.o. male.   Patient reports that his BP has been labile over the past two days, with systolic reaching as high as 195.  His BP had been controlled with carvedilol 12.5mg  BID (He recalls that he had taken a higher dose in the past resulting in hypotension).  Yesterday he felt OK, and today at 9am he began to feel off-balance and dizzy with movement (spinning sensation).  His symptoms are worse with rapid movement, resulting in nausea without vomiting.  The history is provided by the patient and a relative.    Past Medical History:  Diagnosis Date  . COPD (chronic obstructive pulmonary disease) with chronic bronchitis ([name]) 08/26/2018  . Coronary artery disease of native artery of native heart with stable angina pectoris ([name]) 08/26/2018  . Essential hypertension 08/26/2018  . History of coronary artery stent placement 08/26/2018  . Mixed hyperlipidemia 08/26/2018  . 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 Tobacco use disorder, severe, dependence 08/26/2018    Patient Active Problem List   Diagnosis Date Noted  . COPD (chronic obstructive pulmonary disease) with chronic bronchitis ([name]) 08/26/2018  . Coronary artery disease of native artery of native heart with stable angina pectoris ([name]) 08/26/2018  . Mixed hyperlipidemia 08/26/2018  . Essential hypertension 08/26/2018  . 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 Tobacco use disorder, severe, dependence 08/26/2018  . History of coronary artery stent placement 08/26/2018  . White coat syndrome with hypertension 08/26/2018  . Eyelid abnormality 08/26/2018  . Lung nodule 08/26/2018  . Abnormal findings on diagnostic imaging of lung 08/26/2018    Past Surgical History:  Procedure Laterality Date  . BLADDER TUMOR EXCISION     . CORONARY ANGIOPLASTY WITH STENT PLACEMENT    . TONSILLECTOMY         Home Medications    Prior to Admission medications   Medication Sig Start Date End Date Taking? Authorizing Provider  albuterol (PROVENTIL HFA;VENTOLIN HFA) 108 (90 Base) MCG/ACT inhaler Inhale into the lungs every 6 (six) hours as needed for wheezing or shortness of breath.    [provider]  budesonide-formoterol (SYMBICORT) 160-4.5 MCG/ACT inhaler Inhale 2 puffs into the lungs 2 (two) times daily.    [provider]  carvedilol (COREG) 12.5 MG tablet Take 1 tablet (12.5 mg total) by mouth 2 (two) times daily. 08/26/18   [name], DO  furosemide (LASIX) 20 MG tablet Take 1 tablet (20 mg total) by mouth daily. 08/26/18   [name], DO  ipratropium-albuterol (DUONEB) 0.5-2.5 (3) MG/3ML SOLN Take 3 mLs by nebulization 4 (four) times daily. 09/18/18   [name], DO  meclizine (ANTIVERT) 12.5 MG tablet Take one tab PO BID to TID prn dizziness 09/21/18   [name], MD  nitroGLYCERIN (NITROSTAT) 0.4 MG SL tablet Place 0.4 mg under the tongue every 5 (five) minutes as needed for chest pain.    [provider]  OXYGEN Inhale into the lungs at bedtime.    [provider]  prasugrel (EFFIENT) 10 MG TABS tablet Take 10 mg by mouth daily.    [provider]    Family History Family History  Problem Relation Age of Onset  . High blood pressure Mother   . Lung cancer Father   .  High blood pressure Sister     Social History Social History   Tobacco Use  . Smoking status: Current Every Day Smoker  . Smokeless tobacco: Never Used  Substance Use Topics  . Alcohol use: Yes    Alcohol/week: 1.0 standard drinks    Types: 1 Cans of beer per week  . Drug use: Never     Allergies   Plavix [clopidogrel]; Ticagrelor; Atorvastatin; and Simvastatin   Review of Systems Review of Systems  Constitutional: Negative for activity change, appetite  change, chills, diaphoresis, fatigue, fever and unexpected weight change.  HENT: Negative for ear pain, sinus pressure and tinnitus.   Eyes: Negative.   Respiratory: Negative.   Cardiovascular: Negative.   Gastrointestinal: Positive for nausea.  Genitourinary: Negative.   Musculoskeletal: Negative.   Skin: Negative.   Neurological: Positive for dizziness. Negative for tremors, seizures, syncope, facial asymmetry, speech difficulty, weakness, light-headedness, numbness and headaches.     Physical Exam Triage Vital Signs ED Triage Vitals [09/21/18 1531]  Enc Vitals Group     BP (!) 167/94     Pulse Rate 80     Resp 16     Temp      Temp src      SpO2 96 %     Weight 185 lb (83.9 kg)     Height      Head Circumference      Peak Flow      Pain Score 0     Pain Loc      Pain Edu?      Excl. in [place]?    No data found.  Updated Vital Signs BP (!) 167/94 (BP Location: Right Arm)   Pulse 80   Resp 16   Wt 83.9 kg   SpO2 96%   BMI 27.32 kg/m   Visual Acuity Right Eye Distance:   Left Eye Distance:   Bilateral Distance:    Right Eye Near:   Left Eye Near:    Bilateral Near:     Physical Exam Nursing notes and Vital Signs reviewed. Appearance:  Patient appears stated age, and in no acute distress Eyes:  Pupils are equal, round, and reactive to light and accomodation.  Extraocular movement is intact.  Conjunctivae are not inflamed.  Fundi benign.  No photophobia.  Bilateral mild nystagmus on lateral gaze.  Ears:  Canals normal.  Tympanic membranes normal.  Nose:   Normal turbinates.  No sinus tenderness.   Pharynx:  Normal Neck:  Supple.  No adenopathy.  Carotids have normal upstrokes without bruits. Lungs:  Clear to auscultation.  Breath sounds are equal.  Moving air well. Heart:  Regular rate and rhythm without murmurs, rubs, or gallops.  Abdomen:  Nontender without masses or hepatosplenomegaly.  Bowel sounds are present.  No CVA or flank tenderness.  Extremities:   No edema.  Skin:  No rash present.   Neurologic:  Cranial nerves 2 through 12 are normal.  Patellar, achilles, and elbow reflexes are normal.  Cerebellar function is intact (finger-to-nose and rapid alternating hand movement).  No pronator drift.  Gait and station are normal.  Grip strength symmetric bilaterally.  Romberg negative.   UC Treatments / Results  Labs (all labs ordered are listed, but only abnormal results are displayed) Labs Reviewed - No data to display  EKG  Rate:  76 BPM PR:  172 msec QT:  410 msec QTcH:  461 msec QRSD:  94 msec QRS axis:  29 degrees Interpretation:  Normal  sinus rhythm; no acute changes  Radiology No results found.  Procedures Procedures (including critical care time)  Medications Ordered in UC Medications - No data to display  Initial Impression / Assessment and Plan / UC Course  I have reviewed the triage vital signs and the nursing notes.  Pertinent labs & imaging results that were available during my care of the patient were reviewed by me and considered in my medical decision making (see chart for details).    EKG shows no acute changes.  Benign exam reassuring.  Begin Antivert.  Gently increase carvedilol:  Begin 12.5mg  every 8 hours. Followup with Family Doctor if not improved in about 4 to 5 days.   Final Clinical Impressions(s) / UC Diagnoses   Final diagnoses:  Benign paroxysmal positional vertigo due to bilateral vestibular disorder  Essential hypertension     Discharge Instructions     May increase carvedilol 12.5mg  to one tab every 8 hours.  Monitor blood pressure more frequently at different times of day and record on a calendar.  If symptoms become significantly worse during the night or over the weekend, proceed to the local emergency room.     ED Prescriptions    Medication Sig Dispense Auth. Provider   meclizine (ANTIVERT) 12.5 MG tablet Take one tab PO BID to TID prn dizziness 15 tablet [name], MD          [name], MD 09/23/18 [phone]

## 2018-09-22 NOTE — Progress Notes (Signed)
Referring-Natalie Alexander, DO Reason for referral-coronary artery disease  HPI: 77 year old male for evaluation of coronary artery disease at request of [name], DO.  Patient previously cared for in [place].  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  He also had acute renal insufficiency.  He had PCI of his right coronary artery with 3 stents.  Patient with history of myocardial infarction and PCI. Echo 6/17 showed EF 60-65.  Nuclear study January 2018 showed hyperdynamic LV function, fixed perfusion defect in the inferior wall and possible partially reversible apical perfusion defect.  Chest CT November 2019 showed multiple pulmonary nodules suggestive of inflammatory infectious process but malignancy could not be excluded and PET CT recommended.  Patient presents now to establish.  He has chronic dyspnea on exertion secondary to COPD and uses home oxygen.  No orthopnea, PND, pedal edema, chest pain or syncope.  Current Outpatient Medications  Medication Sig Dispense Refill  . albuterol (PROVENTIL HFA;VENTOLIN HFA) 108 (90 Base) MCG/ACT inhaler Inhale into the lungs every 6 (six) hours as needed for wheezing or shortness of breath.    . budesonide-formoterol (SYMBICORT) 160-4.5 MCG/ACT inhaler Inhale 2 puffs into the lungs 2 (two) times daily.    . carvedilol (COREG) 12.5 MG tablet Take 1 tablet (12.5 mg total) by mouth 2 (two) times daily. 180 tablet 3  . furosemide (LASIX) 20 MG tablet Take 1 tablet (20 mg total) by mouth daily. 90 tablet 3  . ipratropium-albuterol (DUONEB) 0.5-2.5 (3) MG/3ML SOLN Take 3 mLs by nebulization 4 (four) times daily. 360 mL 99  . lisinopril (PRINIVIL,ZESTRIL) 10 MG tablet Take 1 tablet (10 mg total) by mouth daily. 90 tablet 0  . meclizine (ANTIVERT) 12.5 MG tablet Take one tab PO BID to TID prn dizziness 15 tablet 1  . nitroGLYCERIN (NITROSTAT) 0.4 MG SL tablet  Place 0.4 mg under the tongue every 5 (five) minutes as needed for chest pain.    . OXYGEN Inhale into the lungs at bedtime.    . prasugrel (EFFIENT) 10 MG TABS tablet Take 10 mg by mouth daily.     No current facility-administered medications for this visit.     Allergies  Allergen Reactions  . Plavix [Clopidogrel] Other (See Comments)    "Purple blood pockets clots in mouth"  . Ticagrelor Shortness Of Breath  . Atorvastatin   . Simvastatin      Past Medical History:  Diagnosis Date  . BPH (benign prostatic hyperplasia)   . COPD (chronic obstructive pulmonary disease) with chronic bronchitis ([place]) 08/26/2018  . Coronary artery disease of native artery of native heart with stable angina pectoris ([place]) 08/26/2018  . Essential hypertension 08/26/2018  . History of coronary artery stent placement 08/26/2018  . Mixed hyperlipidemia 08/26/2018  . 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 Tobacco use disorder, severe, dependence 08/26/2018    Past Surgical History:  Procedure Laterality Date  . BLADDER TUMOR EXCISION    . CORONARY ANGIOPLASTY WITH STENT PLACEMENT    . PROSTATE SURGERY    . TONSILLECTOMY      Social History   Socioeconomic History  . Marital status: Single    Spouse name: Not on file  . Number of children: 1  . Years of education: Not on file  . Highest education level: Not on file  Occupational History  . Not on file  Social Needs  . Financial resource strain: Not on file  . Food insecurity:  Worry: Not on file    Inability: Not on file  . Transportation needs:    Medical: Not on file    Non-medical: Not on file  Tobacco Use  . Smoking status: Current Every Day Smoker  . Smokeless tobacco: Never Used  Substance and Sexual Activity  . Alcohol use: Yes    Alcohol/week: 1.0 standard drinks    Types: 1 Cans of beer per week    Comment: Rare  . Drug use: Never  . Sexual activity: Not Currently    Partners: Female  Lifestyle  . Physical activity:    Days  per week: Not on file    Minutes per session: Not on file  . Stress: Not on file  Relationships  . Social connections:    Talks on phone: Not on file    Gets together: Not on file    Attends religious service: Not on file    Active member of club or organization: Not on file    Attends meetings of clubs or organizations: Not on file    Relationship status: Not on file  . Intimate partner violence:    Fear of current or ex partner: Not on file    Emotionally abused: Not on file    Physically abused: Not on file    Forced sexual activity: Not on file  Other Topics Concern  . Not on file  Social History Narrative  . Not on file    Family History  Problem Relation Age of Onset  . High blood pressure Mother   . Heart failure Mother   . Lung cancer Father   . High blood pressure Sister     ROS: Chronic productive cough but no fevers or chills, hemoptysis, dysphasia, odynophagia, melena, hematochezia, dysuria, hematuria, rash, seizure activity, orthopnea, PND, pedal edema, claudication. Remaining systems are negative.  Physical Exam:   Blood pressure (!) 180/72, pulse 75, height 5\' 9"  (1.753 m), weight 186 lb 1.9 oz (84.4 kg), SpO2 94 %.  General:  Well developed/well nourished in NAD Skin warm/dry Patient not depressed No peripheral clubbing Back-normal HEENT-normal/normal eyelids Neck supple/normal carotid upstroke bilaterally; left carotid bruit; no JVD; no thyromegaly chest -diminished breath sounds throughout CV - RRR/normal S1 and S2; no murmurs, rubs or gallops;  PMI nondisplaced Abdomen -NT/ND, no HSM, no mass, + bowel sounds, no bruit 2+ femoral pulses, no bruits Ext-no edema, chords; decreased distal pulses Neuro-grossly nonfocal  ECG -September 21, 2018-sinus rhythm with prior inferior infarct.  Personally reviewed  A/P  1 coronary artery disease with prior PCI of RCA-discontinue prasugrel.  Add aspirin 81 mg daily.  Intolerant to statins.  2 bruit-schedule  carotid Dopplers.  Also with abdominal bruit and we will arrange abdominal ultrasound to exclude aneurysm.  3 hyperlipidemia-check lipids.  If LDL greater than 70 will consider referral to lipid clinic for [place].  4 hypertension-patient's blood pressure is mildly elevated.  Lisinopril was recently added to his medical regimen.  I have asked him to follow his blood pressure and we will advance as needed.  5 tobacco abuse-patient counseled on discontinuing.  6 lung nodules-PET scan has been arranged.  7 home O2 dependent COPD  Kirk Ruths, MD

## 2018-09-25 ENCOUNTER — Ambulatory Visit (INDEPENDENT_AMBULATORY_CARE_PROVIDER_SITE_OTHER): Payer: Medicare Other | Admitting: Osteopathic Medicine

## 2018-09-25 ENCOUNTER — Encounter: Payer: Self-pay | Admitting: Osteopathic Medicine

## 2018-09-25 ENCOUNTER — Other Ambulatory Visit: Payer: Self-pay | Admitting: Osteopathic Medicine

## 2018-09-25 VITALS — BP 160/61 | HR 72 | Temp 98.0°F | Wt 184.8 lb

## 2018-09-25 DIAGNOSIS — I10 Essential (primary) hypertension: Secondary | ICD-10-CM | POA: Diagnosis not present

## 2018-09-25 DIAGNOSIS — I25118 Atherosclerotic heart disease of native coronary artery with other forms of angina pectoris: Secondary | ICD-10-CM

## 2018-09-25 MED ORDER — IPRATROPIUM-ALBUTEROL 0.5-2.5 (3) MG/3ML IN SOLN: 3.0000 mL | Freq: Four times a day (QID) | RESPIRATORY_TRACT | 99 refills | Status: DC

## 2018-09-25 MED ORDER — LISINOPRIL 10 MG PO TABS: 10.0000 mg | ORAL_TABLET | Freq: Every day | ORAL | 0 refills | Status: DC

## 2018-09-25 MED ORDER — CARVEDILOL 12.5 MG PO TABS: 12.5000 mg | ORAL_TABLET | Freq: Two times a day (BID) | ORAL | 3 refills | Status: DC

## 2018-09-25 NOTE — Progress Notes (Signed)
HPI: Christopher Armstrong is a 77 y.o. male who  has a past medical history of COPD (chronic obstructive pulmonary disease) with chronic bronchitis ([place]) (08/26/2018), Coronary artery disease of native artery of native heart with stable angina pectoris ([place]) (08/26/2018), Essential hypertension (08/26/2018), History of coronary artery stent placement (08/26/2018), Mixed hyperlipidemia (08/26/2018), Statin myopathy (08/26/2018), and Tobacco use disorder, severe, dependence (08/26/2018).  he presents to [facility] today, 09/25/18,  for chief complaint of:  HTN follow-up  Urgent care visit 5 days ago for dizziness, weakness, HTN. Labile BP, systolic as high as 259, feeling dizzy say of visit, worse w/ rapid movement and associated w/ nausea. Carvedilol 12.5 was increased from bid to tid as BP was elevated. Dizziness thought to be more BPPV related. Pt states dizziness has essentially resolved. Home BP monitor verified at last visit.   BP Readings from Last 3 Encounters:  09/25/18 (!) 160/61  09/21/18 (!) 167/94  09/18/18 (!) 189/70   Reports he has been on Lisinopril in the past which was working well, not sure why ever stopped, he'd like to get back on this.        At today's visit... Past medical history, surgical history, and family history reviewed and updated as needed.  Current medication list and allergy/intolerance information reviewed and updated as needed. (See remainder of HPI, ROS, Phys Exam below)          ASSESSMENT/PLAN: The primary encounter diagnosis was White coat syndrome with hypertension. A diagnosis of Coronary artery disease of native artery of native heart with stable angina pectoris [facility]) was also pertinent to this visit.   Orders Placed This Encounter  Procedures  . CBC  . COMPLETE METABOLIC PANEL WITH GFR  . TSH  . Magnesium     Meds ordered this encounter  Medications  . DISCONTD: lisinopril (PRINIVIL,ZESTRIL) 10  MG tablet    Sig: Take 1 tablet (10 mg total) by mouth daily.    Dispense:  90 tablet    Refill:  0  . carvedilol (COREG) 12.5 MG tablet    Sig: Take 1 tablet (12.5 mg total) by mouth 2 (two) times daily.    Dispense:  180 tablet    Refill:  3  . ipratropium-albuterol (DUONEB) 0.5-2.5 (3) MG/3ML SOLN    Sig: Take 3 mLs by nebulization 4 (four) times daily.    Dispense:  360 mL    Refill:  99    Fax to [facility]  . lisinopril (PRINIVIL,ZESTRIL) 10 MG tablet    Sig: Take 1 tablet (10 mg total) by mouth daily.    Dispense:  90 tablet    Refill:  0    Patient Instructions  Keep cardiology appointment 10/01/18 and can recheck blood pressure. I'll look at Dr. Jacalyn Lefevre records and we can go from there.   Will add Lisinopril 10 mg to current regimen.      Follow-up plan: Return for recheck TBD based on caridology appointment .                             ############################################ ############################################ ############################################ ############################################    No outpatient medications have been marked as taking for the 09/25/18 encounter (Appointment) with [name], DO.    Allergies  Allergen Reactions  . Plavix [Clopidogrel] Other (See Comments)    "Purple blood pockets clots in mouth"  . Ticagrelor Shortness Of Breath  . Atorvastatin   .  Simvastatin        Review of Systems:  Constitutional: No recent illness  HEENT: No  headache, no vision change  Cardiac: No  chest pain, No  pressure, No palpitations  Respiratory:  No  shortness of breath. No  Cough  Gastrointestinal: No  abdominal pain  Neurologic: No  weakness, +Dizziness per HPI   Exam:  BP (!) 160/61 (BP Location: Left Arm, Patient Position: Sitting, Cuff Size: Normal)   Pulse 72   Temp 98 F (36.7 C) (Oral)   Wt 184 lb 12.8 oz (83.8 kg)   BMI 27.29 kg/m    Constitutional: VS see above. General Appearance: alert, well-developed, well-nourished, NAD  Eyes: Normal lids and conjunctive, non-icteric sclera  Ears, Nose, Mouth, Throat: MMM, Normal external inspection ears/nares/mouth/lips/gums.  Neck: No masses, trachea midline.   Respiratory: Normal respiratory effort. no wheeze, no rhonchi, no rales  Cardiovascular: S1/S2 normal, no murmur, no rub/gallop auscultated. RRR.   Musculoskeletal: Gait normal. Symmetric and independent movement of all extremities  Neurological: Normal balance/coordination. No tremor.  Skin: warm, dry, intact.   Psychiatric: Normal judgment/insight. Normal mood and affect. Oriented x3.       Visit summary with medication list and pertinent instructions was printed for patient to review, patient was advised to alert [place] if any updates are needed. All questions at time of visit were answered - patient instructed to contact office with any additional concerns. ER/RTC precautions were reviewed with the patient and understanding verbalized.   Note: Total time spent 25 minutes, greater than 50% of the visit was spent face-to-face counseling and coordinating care for the following: The primary encounter diagnosis was White coat syndrome with hypertension. A diagnosis of Coronary artery disease of native artery of native heart with stable angina pectoris [facility]) was also pertinent to this visit.Marland Kitchen  Please note: voice recognition software was used to produce this document, and typos may escape review. Please contact Dr. Sheppard Coil for any needed clarifications.    Follow up plan: Return for recheck TBD based on caridology appointment .

## 2018-09-25 NOTE — Patient Instructions (Addendum)
Keep cardiology appointment 10/01/18 and can recheck blood pressure. I'll look at Dr. Jacalyn Lefevre records and we can go from there.   Will add Lisinopril 10 mg to current regimen.

## 2018-09-26 LAB — CBC
HCT: 43.5 % (ref 38.5–50.0)
Hemoglobin: 14.1 g/dL (ref 13.2–17.1)
MCH: 30.1 pg (ref 27.0–33.0)
MCHC: 32.4 g/dL (ref 32.0–36.0)
MCV: 92.9 fL (ref 80.0–100.0)
MPV: 13.4 fL — ABNORMAL HIGH (ref 7.5–12.5)
Platelets: 164 10*3/uL (ref 140–400)
RBC: 4.68 10*6/uL (ref 4.20–5.80)
RDW: 13.1 % (ref 11.0–15.0)
WBC: 9.3 10*3/uL (ref 3.8–10.8)

## 2018-09-26 LAB — COMPLETE METABOLIC PANEL WITH GFR
AG RATIO: 1.4 (calc) (ref 1.0–2.5)
ALT: 13 U/L (ref 9–46)
AST: 15 U/L (ref 10–35)
Albumin: 4.1 g/dL (ref 3.6–5.1)
Alkaline phosphatase (APISO): 52 U/L (ref 40–115)
BUN: 14 mg/dL (ref 7–25)
CHLORIDE: 104 mmol/L (ref 98–110)
CO2: 30 mmol/L (ref 20–32)
Calcium: 9 mg/dL (ref 8.6–10.3)
Creat: 0.99 mg/dL (ref 0.70–1.18)
GFR, Est African American: 86 mL/min/{1.73_m2} (ref >=60)
GFR, Est Non African American: 74 mL/min/{1.73_m2} (ref >=60)
GLOBULIN: 2.9 g/dL (ref 1.9–3.7)
Glucose, Bld: 87 mg/dL (ref 65–99)
POTASSIUM: 3.8 mmol/L (ref 3.5–5.3)
Sodium: 142 mmol/L (ref 135–146)
Total Bilirubin: 0.6 mg/dL (ref 0.2–1.2)
Total Protein: 7 g/dL (ref 6.1–8.1)

## 2018-09-26 LAB — TSH: TSH: 1.59 mIU/L (ref 0.40–4.50)

## 2018-09-26 LAB — MAGNESIUM: Magnesium: 2 mg/dL (ref 1.5–2.5)

## 2018-10-01 ENCOUNTER — Ambulatory Visit (INDEPENDENT_AMBULATORY_CARE_PROVIDER_SITE_OTHER): Payer: Medicare Other | Admitting: Cardiology

## 2018-10-01 ENCOUNTER — Encounter: Payer: Self-pay | Admitting: Cardiology

## 2018-10-01 VITALS — BP 180/72 | HR 75 | Ht 69.0 in | Wt 186.1 lb

## 2018-10-01 DIAGNOSIS — Z955 Presence of coronary angioplasty implant and graft: Secondary | ICD-10-CM

## 2018-10-01 DIAGNOSIS — I25118 Atherosclerotic heart disease of native coronary artery with other forms of angina pectoris: Secondary | ICD-10-CM | POA: Diagnosis not present

## 2018-10-01 DIAGNOSIS — E7800 Pure hypercholesterolemia, unspecified: Secondary | ICD-10-CM

## 2018-10-01 DIAGNOSIS — Z87891 Personal history of nicotine dependence: Secondary | ICD-10-CM

## 2018-10-01 DIAGNOSIS — R0989 Other specified symptoms and signs involving the circulatory and respiratory systems: Secondary | ICD-10-CM

## 2018-10-01 MED ORDER — ASPIRIN EC 81 MG PO TBEC: 81.0000 mg | DELAYED_RELEASE_TABLET | Freq: Every day | ORAL | 3 refills | Status: DC

## 2018-10-01 NOTE — Patient Instructions (Signed)
Medication Instructions:  STOP PRASUGREL  START ASPIRIN 81 MG ONCE DAILY If you need a refill on your cardiac medications before your next appointment, please call your pharmacy.   Lab work: Your physician recommends that you return for lab work PRIOR TO EATING If you have labs (blood work) drawn today and your tests are completely normal, you will receive your results only by: Marland Kitchen MyChart Message (if you have MyChart) OR . A paper copy in the mail If you have any lab test that is abnormal or we need to change your treatment, we will call you to review the results.  Testing/Procedures: Your physician has requested that you have a carotid duplex. This test is an ultrasound of the carotid arteries in your neck. It looks at blood flow through these arteries that supply the brain with blood. Allow one hour for this exam. There are no restrictions or special instructions.AT THE [place] LOC[facility]   Your physician has requested that you have an abdominal aorta duplex. During this test, an ultrasound is used to evaluate the aorta. Allow 30 minutes for this exam. Do not eat after midnight the day before and avoid carbonated beverages AT THE [place] LOCATION  Follow-Up: Your physician recommends that you schedule a follow-up appointment in: [place] TO GET AN APPOINTMENT IN June 2020

## 2018-10-06 ENCOUNTER — Other Ambulatory Visit: Payer: Self-pay | Admitting: Osteopathic Medicine

## 2018-10-06 ENCOUNTER — Encounter (HOSPITAL_COMMUNITY)
Admission: RE | Admit: 2018-10-06 | Discharge: 2018-10-06 | Disposition: A | Discharge status: Home / Self Care | Payer: Medicare Other | Source: Ambulatory Visit | Attending: Osteopathic Medicine | Admitting: Osteopathic Medicine

## 2018-10-06 DIAGNOSIS — R918 Other nonspecific abnormal finding of lung field: Secondary | ICD-10-CM | POA: Diagnosis not present

## 2018-10-06 DIAGNOSIS — R942 Abnormal results of pulmonary function studies: Secondary | ICD-10-CM

## 2018-10-06 DIAGNOSIS — R911 Solitary pulmonary nodule: Secondary | ICD-10-CM | POA: Diagnosis not present

## 2018-10-06 DIAGNOSIS — K769 Liver disease, unspecified: Secondary | ICD-10-CM | POA: Diagnosis not present

## 2018-10-06 LAB — GLUCOSE, CAPILLARY: Glucose-Capillary: 107 mg/dL — ABNORMAL HIGH (ref 70–99)

## 2018-10-06 MED ORDER — FLUDEOXYGLUCOSE F - 18 (FDG) INJECTION
9.2600 | Freq: Once | INTRAVENOUS | Status: AC | PRN
  Administered 2018-10-06: 9.26 via INTRAVENOUS

## 2018-10-13 ENCOUNTER — Telehealth: Payer: Self-pay | Admitting: Family Medicine

## 2018-10-13 ENCOUNTER — Ambulatory Visit (HOSPITAL_BASED_OUTPATIENT_CLINIC_OR_DEPARTMENT_OTHER): Payer: Medicare Other

## 2018-10-13 NOTE — Telephone Encounter (Signed)
Discussion with sister regarding PET scan results.  Will contact nurse navigator at the cancer center at [facility].  Patient should expect a call.

## 2018-10-14 ENCOUNTER — Telehealth: Payer: Self-pay

## 2018-10-14 NOTE — Telephone Encounter (Signed)
Per nurse navigator - Dr Marin Olp suggest pt be referred to pulmonary, needs work up first, prior to scheduling appt.

## 2018-10-16 NOTE — Telephone Encounter (Signed)
Left a detailed vm msg for pt regarding provider's note. Call back info provided.

## 2018-10-16 NOTE — Telephone Encounter (Signed)
Pulmonary referral is in place, I had already sent message back.  Can we call patient and see if he has heard back about pulm referral yet? THanks!

## 2018-10-17 NOTE — Telephone Encounter (Signed)
Patient hasn't heard from the Pulmonary office. He still needs the referral.

## 2018-10-17 NOTE — Telephone Encounter (Signed)
Patient called back and left a message for a return call.

## 2018-10-20 NOTE — Telephone Encounter (Signed)
Updated referral in Epic they should call patient to schedule soon - CF

## 2018-10-28 ENCOUNTER — Telehealth: Payer: Self-pay | Admitting: Osteopathic Medicine

## 2018-10-28 NOTE — Telephone Encounter (Signed)
Patients sister, Burna Forts, calling in stating that patient wants to wait till after his appointment to come see Dr.Alexander if needed. No further questions at this time.

## 2018-11-12 ENCOUNTER — Telehealth: Payer: Self-pay | Admitting: Osteopathic Medicine

## 2018-11-12 NOTE — Telephone Encounter (Signed)
Please call patient: I got some paperwork form [employer].   To confirm, I believe the medical equipment he uses which requires electricity is his nebulizer and I'm not sure if he has an oxygen compressor or just the tanks? No CPAP machine, either, correct?   Once he has confirmed the above information, I can complete the paperwork and sent it.  Thanks!

## 2018-11-12 NOTE — Telephone Encounter (Signed)
Patient has nebulizer and oxygen compressor.

## 2018-11-26 ENCOUNTER — Ambulatory Visit: Payer: Medicare Other | Admitting: Osteopathic Medicine

## 2018-12-11 ENCOUNTER — Institutional Professional Consult (permissible substitution): Payer: Medicare Other | Admitting: Pulmonary Disease

## 2018-12-18 ENCOUNTER — Telehealth: Payer: Self-pay

## 2018-12-18 NOTE — Telephone Encounter (Signed)
Pt dropped off patient assistance packet to be completed for RX for his Ventolin Inhaler.  RX was previously written by old provider and he wants it to be renewed by Dr Sheppard Coil.   Paperwork put in providers box and verbally told provider it was there.   Will call pt when completed so he can mail

## 2018-12-19 ENCOUNTER — Telehealth: Payer: Self-pay | Admitting: Osteopathic Medicine

## 2018-12-19 ENCOUNTER — Other Ambulatory Visit: Payer: Self-pay | Admitting: Osteopathic Medicine

## 2018-12-19 MED ORDER — ALBUTEROL SULFATE HFA 108 (90 BASE) MCG/ACT IN AERS: 1.0000 | INHALATION_SPRAY | Freq: Four times a day (QID) | RESPIRATORY_TRACT | 99 refills | Status: DC | PRN

## 2018-12-19 NOTE — Telephone Encounter (Signed)
Pt advised. He will come pick it up.

## 2018-12-19 NOTE — Telephone Encounter (Signed)
Patient dropped off some forms for me, there is actually no physician input necessary on the forms, I printed a prescription and signed it, put it in envelope w/ his forms, forms are up front for him to pick up or we can mail back to him

## 2019-02-05 ENCOUNTER — Institutional Professional Consult (permissible substitution): Payer: Medicare Other | Admitting: Pulmonary Disease

## 2019-02-11 ENCOUNTER — Telehealth: Payer: Self-pay | Admitting: Osteopathic Medicine

## 2019-02-11 NOTE — Telephone Encounter (Signed)
Pt called to see if PCP was able to complete the forms for astrazeneca. States his sister dropped them off on 02/03/19. Routing.

## 2019-02-12 NOTE — Telephone Encounter (Signed)
Done and faxed

## 2019-02-27 ENCOUNTER — Other Ambulatory Visit: Payer: Self-pay | Admitting: Osteopathic Medicine

## 2019-03-17 ENCOUNTER — Ambulatory Visit: Payer: Medicare Other

## 2019-05-20 NOTE — Progress Notes (Signed)
Subjective:   Christopher Armstrong is a 78 y.o. male who presents for an Initial Medicare Annual Wellness Visit.  Review of Systems  No ROS.  Medicare Wellness Virtual Visit.  Visual/audio telehealth visit, UTA vital signs.   See social history for additional risk factors.    Cardiac Risk Factors include: advanced age (>3men, >39 women);sedentary lifestyle;hypertension;male gender;smoking/ tobacco exposure;dyslipidemia Sleep patterns: Getting 5 hours of sleep a night. Feels refreshed when wakes up.   Home Safety/Smoke Alarms: Feels safe in home. Smoke alarms in place.  Living environment; Lives alone in apartment on the ground floor. No steps. Seat Belt Safety/Bike Helmet: Wears seat belt.     Male:   CCS-  Aged out   PSA- No results found for: PSA      Objective:    Today's Vitals   05/25/19 1120  BP: (!) 155/80  Pulse: 65  Temp: (!) 97.4 F (36.3 C)  TempSrc: Oral  SpO2: 96%  Weight: 190 lb (86.2 kg)  Height: 5\' 9"  (1.753 m)   Body mass index is 28.06 kg/m.  Advanced Directives 05/25/2019  Does Patient Have a Medical Advance Directive? No  Would patient like information on creating a medical advance directive? No - Patient declined    Current Medications (verified) Outpatient Encounter Medications as of 05/25/2019  Medication Sig  . albuterol (PROVENTIL HFA;VENTOLIN HFA) 108 (90 Base) MCG/ACT inhaler Inhale 1-2 puffs into the lungs every 6 (six) hours as needed for wheezing or shortness of breath.  Marland Kitchen aspirin EC 81 MG tablet Take 1 tablet (81 mg total) by mouth daily.  . budesonide-formoterol (SYMBICORT) 160-4.5 MCG/ACT inhaler Inhale 2 puffs into the lungs 2 (two) times daily.  . carvedilol (COREG) 12.5 MG tablet Take 1 tablet (12.5 mg total) by mouth 2 (two) times daily.  . furosemide (LASIX) 20 MG tablet Take 1 tablet (20 mg total) by mouth daily.  Marland Kitchen ipratropium-albuterol (DUONEB) 0.5-2.5 (3) MG/3ML SOLN Take 3 mLs by nebulization 4 (four) times daily.  .  nitroGLYCERIN (NITROSTAT) 0.4 MG SL tablet Place 0.4 mg under the tongue every 5 (five) minutes as needed for chest pain.  . OXYGEN Inhale into the lungs at bedtime.  Marland Kitchen lisinopril (ZESTRIL) 10 MG tablet Take 1 tablet (10 mg total) by mouth daily. Due for follow up (Patient not taking: Reported on 05/25/2019)  . meclizine (ANTIVERT) 12.5 MG tablet Take one tab PO BID to TID prn dizziness (Patient not taking: Reported on 05/25/2019)   No facility-administered encounter medications on file as of 05/25/2019.     Allergies (verified) Plavix [clopidogrel], Ticagrelor, Atorvastatin, and Simvastatin   History: Past Medical History:  Diagnosis Date  . BPH (benign prostatic hyperplasia)   . COPD (chronic obstructive pulmonary disease) with chronic bronchitis ([place]) 08/26/2018  . Coronary artery disease of native artery of native heart with stable angina pectoris ([place]) 08/26/2018  . Essential hypertension 08/26/2018  . History of coronary artery stent placement 08/26/2018  . Mixed hyperlipidemia 08/26/2018  . 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 Tobacco use disorder, severe, dependence 08/26/2018   Past Surgical History:  Procedure Laterality Date  . BLADDER TUMOR EXCISION    . CORONARY ANGIOPLASTY WITH STENT PLACEMENT    . PROSTATE SURGERY    . TONSILLECTOMY     Family History  Problem Relation Age of Onset  . High blood pressure Mother   . Heart failure Mother   . Lung cancer Father   . High blood pressure Sister    Social History  Socioeconomic History  . Marital status: Single    Spouse name: Not on file  . Number of children: 1  . Years of education: 52  . Highest education level: 12th grade  Occupational History  . Occupation: Architect    Comment: retired  Scientific laboratory technician  . Financial resource strain: Not hard at all  . Food insecurity    Worry: Never true    Inability: Never true  . Transportation needs    Medical: No    Non-medical: No  Tobacco Use  . Smoking status:  Current Every Day Smoker    Packs/day: 1.00    Years: 65.00    Pack years: 65.00  . Smokeless tobacco: Never Used  Substance and Sexual Activity  . Alcohol use: Not Currently    Alcohol/week: 0.0 standard drinks    Comment: 0  . Drug use: Never  . Sexual activity: Not Currently    Partners: Female  Lifestyle  . Physical activity    Days per week: 0 days    Minutes per session: 0 min  . Stress: Not at all  Relationships  . Social Herbalist on phone: Once a week    Gets together: Once a week    Attends religious service: Never    Active member of club or organization: No    Attends meetings of clubs or organizations: Never    Relationship status: Patient refused  Other Topics Concern  . Not on file  Social History Narrative   Watch TV   Tobacco Counseling Ready to quit: No Counseling given: No   Clinical Intake:  Pre-visit preparation completed: Yes  Pain : No/denies pain     Nutritional Risks: None Diabetes: No  How often do you need to have someone help you when you read instructions, pamphlets, or other written materials from your doctor or pharmacy?: 1 - Never What is the last grade level you completed in school?: 12  Interpreter Needed?: No  Information entered by :: Orlie Dakin, LPN  Activities of Daily Living In your present state of health, do you have any difficulty performing the following activities: 05/25/2019  Hearing? N  Vision? N  Difficulty concentrating or making decisions? N  Walking or climbing stairs? Y  Dressing or bathing? N  Doing errands, shopping? N  Preparing Food and eating ? N  Using the Toilet? N  In the past six months, have you accidently leaked urine? N  Do you have problems with loss of bowel control? N  Managing your Medications? N  Managing your Finances? N  Housekeeping or managing your Housekeeping? N     Immunizations and Health Maintenance  There is no immunization history on file for this  patient. Health Maintenance Due  Topic Date Due  . COLONOSCOPY  08/19/1993    Patient Care Team: [name], DO as PCP - General (Osteopathic Medicine)  Indicate any recent Medical Services you may have received from other than [facility] providers in the past year (date may be approximate).    Assessment:   This is a routine wellness examination for Emersen.Physical assessment deferred to PCP.   Hearing/Vision screen No exam data present  Dietary issues and exercise activities discussed: Current Exercise Habits: The patient does not participate in regular exercise at present, Exercise limited by: Other - see comments(COPD) Diet Eats healthy Breakfast:little debbie pie to take meds Lunch: skips Dinner:  [place] goulash, Meat loaf with vegetables     Goals    .  Patient Stated     Patient states that he wants to live long enough to go to [place] to go fishing.    . Quit Smoking      Depression Screen PHQ 2/9 Scores 05/25/2019 08/26/2018  PHQ - 2 Score 0 0  PHQ- 9 Score - 1    Fall Risk Fall Risk  05/25/2019  Falls in the past year? 0  Follow up Falls prevention discussed    Is the patient's home free of loose throw rugs in walkways, pet beds, electrical cords, etc?   yes      Grab bars in the bathroom? no      Handrails on the stairs?   no      Adequate lighting?   yes   Cognitive Function:     6CIT Screen 05/25/2019  What Year? 0 points  What month? 0 points  What time? 0 points  Count back from 20 0 points  Months in reverse 0 points  Repeat phrase 2 points  Total Score 2    Screening Tests Health Maintenance  Topic Date Due  . COLONOSCOPY  08/19/1993  . TETANUS/TDAP  08/27/2019 (Originally 08/19/1960)  . INFLUENZA VACCINE  Discontinued  . PNA vac Low Risk Adult  Discontinued       Plan:      Mr. Wuebker , Thank you for taking time to come for your Medicare Wellness Visit. I appreciate your ongoing commitment to your health goals. Please review  the following plan we discussed and let me know if I can assist you in the future.  Please schedule your next medicare wellness visit with me in 1 yr.  These are the goals we discussed: Goals    . Patient Stated     Patient states that he wants to live long enough to go to [place] to go fishing.    . Quit Smoking       This is a list of the screening recommended for you and due dates:  Health Maintenance  Topic Date Due  . Colon Cancer Screening  08/19/1993  . Tetanus Vaccine  08/27/2019*  . Flu Shot  Discontinued  . Pneumonia vaccines  Discontinued  *Topic was postponed. The date shown is not the original due date.        These are the goals we discussed: Goals    . Patient Stated     Patient states that he wants to live long enough to go to [place] to go fishing.    . Quit Smoking       This is a list of the screening recommended for you and due dates:  Health Maintenance  Topic Date Due  . Colon Cancer Screening  08/19/1993  . Tetanus Vaccine  08/27/2019*  . Flu Shot  Discontinued  . Pneumonia vaccines  Discontinued  *Topic was postponed. The date shown is not the original due date.     I have personally reviewed and noted the following in the patient's chart:   . Medical and social history . Use of alcohol, tobacco or illicit drugs  . Current medications and supplements . Functional ability and status . Nutritional status . Physical activity . Advanced directives . List of other physicians . Hospitalizations, surgeries, and ER visits in previous 12 months . Vitals . Screenings to include cognitive, depression, and falls . Referrals and appointments  In addition, I have reviewed and discussed with patient certain preventive protocols, quality metrics, and best practice recommendations. A written  personalized care plan for preventive services as well as general preventive health recommendations were provided to patient.     Joanne Chars,  LPN   01/16/7095

## 2019-05-25 ENCOUNTER — Ambulatory Visit (INDEPENDENT_AMBULATORY_CARE_PROVIDER_SITE_OTHER): Payer: Medicare Other | Admitting: *Deleted

## 2019-05-25 VITALS — BP 155/80 | HR 65 | Temp 97.4°F | Ht 69.0 in | Wt 190.0 lb

## 2019-05-25 DIAGNOSIS — Z0000 Encounter for general adult medical examination without abnormal findings: Secondary | ICD-10-CM

## 2019-05-25 NOTE — Patient Instructions (Signed)
Christopher Armstrong , Thank you for taking time to come for your Medicare Wellness Visit. I appreciate your ongoing commitment to your health goals. Please review the following plan we discussed and let me know if I can assist you in the future.  Please schedule your next medicare wellness visit with me in 1 yr. These are the goals we discussed: Goals    . Patient Stated     Patient states that he wants to live long enough to go to [place] to go fishing.    . Quit Smoking     These are the goals we discussed: Goals    . Patient Stated     Patient states that he wants to live long enough to go to [place] to go fishing.    . Quit Smoking

## 2019-06-16 DIAGNOSIS — J9621 Acute and chronic respiratory failure with hypoxia: Secondary | ICD-10-CM | POA: Diagnosis present

## 2019-06-16 DIAGNOSIS — Z66 Do not resuscitate: Secondary | ICD-10-CM | POA: Diagnosis present

## 2019-06-16 DIAGNOSIS — G720 Drug-induced myopathy: Secondary | ICD-10-CM | POA: Diagnosis not present

## 2019-06-16 DIAGNOSIS — I25118 Atherosclerotic heart disease of native coronary artery with other forms of angina pectoris: Secondary | ICD-10-CM | POA: Diagnosis not present

## 2019-06-16 DIAGNOSIS — I083 Combined rheumatic disorders of mitral, aortic and tricuspid valves: Secondary | ICD-10-CM | POA: Diagnosis not present

## 2019-06-16 DIAGNOSIS — Z955 Presence of coronary angioplasty implant and graft: Secondary | ICD-10-CM | POA: Diagnosis not present

## 2019-06-16 DIAGNOSIS — J449 Chronic obstructive pulmonary disease, unspecified: Secondary | ICD-10-CM | POA: Diagnosis not present

## 2019-06-16 DIAGNOSIS — Z9861 Coronary angioplasty status: Secondary | ICD-10-CM | POA: Diagnosis not present

## 2019-06-16 DIAGNOSIS — R918 Other nonspecific abnormal finding of lung field: Secondary | ICD-10-CM | POA: Diagnosis not present

## 2019-06-16 DIAGNOSIS — I2510 Atherosclerotic heart disease of native coronary artery without angina pectoris: Secondary | ICD-10-CM | POA: Diagnosis present

## 2019-06-16 DIAGNOSIS — R0602 Shortness of breath: Secondary | ICD-10-CM | POA: Diagnosis not present

## 2019-06-16 DIAGNOSIS — I252 Old myocardial infarction: Secondary | ICD-10-CM | POA: Diagnosis not present

## 2019-06-16 DIAGNOSIS — F17210 Nicotine dependence, cigarettes, uncomplicated: Secondary | ICD-10-CM | POA: Diagnosis not present

## 2019-06-16 DIAGNOSIS — Z9981 Dependence on supplemental oxygen: Secondary | ICD-10-CM | POA: Diagnosis not present

## 2019-06-16 DIAGNOSIS — R000 Tachycardia, unspecified: Secondary | ICD-10-CM | POA: Diagnosis not present

## 2019-06-16 DIAGNOSIS — J810 Acute pulmonary edema: Secondary | ICD-10-CM | POA: Diagnosis not present

## 2019-06-16 DIAGNOSIS — R079 Chest pain, unspecified: Secondary | ICD-10-CM | POA: Diagnosis not present

## 2019-06-16 DIAGNOSIS — I5033 Acute on chronic diastolic (congestive) heart failure: Secondary | ICD-10-CM | POA: Diagnosis present

## 2019-06-16 DIAGNOSIS — I519 Heart disease, unspecified: Secondary | ICD-10-CM | POA: Diagnosis not present

## 2019-06-16 DIAGNOSIS — J441 Chronic obstructive pulmonary disease with (acute) exacerbation: Secondary | ICD-10-CM | POA: Diagnosis not present

## 2019-06-16 DIAGNOSIS — E7800 Pure hypercholesterolemia, unspecified: Secondary | ICD-10-CM | POA: Diagnosis present

## 2019-06-16 DIAGNOSIS — Z888 Allergy status to other drugs, medicaments and biological substances status: Secondary | ICD-10-CM | POA: Diagnosis not present

## 2019-06-16 DIAGNOSIS — I4891 Unspecified atrial fibrillation: Secondary | ICD-10-CM | POA: Diagnosis not present

## 2019-06-16 DIAGNOSIS — F17200 Nicotine dependence, unspecified, uncomplicated: Secondary | ICD-10-CM | POA: Diagnosis not present

## 2019-06-16 DIAGNOSIS — E782 Mixed hyperlipidemia: Secondary | ICD-10-CM | POA: Diagnosis not present

## 2019-06-16 DIAGNOSIS — I10 Essential (primary) hypertension: Secondary | ICD-10-CM | POA: Diagnosis not present

## 2019-06-16 DIAGNOSIS — I110 Hypertensive heart disease with heart failure: Secondary | ICD-10-CM | POA: Diagnosis present

## 2019-06-16 DIAGNOSIS — T466X5A Adverse effect of antihyperlipidemic and antiarteriosclerotic drugs, initial encounter: Secondary | ICD-10-CM | POA: Diagnosis not present

## 2019-06-18 ENCOUNTER — Telehealth: Payer: Self-pay

## 2019-06-18 MED ORDER — DILTIAZEM HCL ER COATED BEADS 180 MG PO CP24
180.00 | ORAL_CAPSULE | ORAL | Status: DC
Start: 2019-06-19 — End: 2019-06-18

## 2019-06-18 MED ORDER — BUDESONIDE-FORMOTEROL FUMARATE 160-4.5 MCG/ACT IN AERO
2.00 | INHALATION_SPRAY | RESPIRATORY_TRACT | Status: DC
Start: 2019-06-18 — End: 2019-06-18

## 2019-06-18 MED ORDER — ALBUTEROL SULFATE (2.5 MG/3ML) 0.083% IN NEBU
2.50 | INHALATION_SOLUTION | RESPIRATORY_TRACT | Status: DC
Start: ? — End: 2019-06-18

## 2019-06-18 MED ORDER — GENERIC EXTERNAL MEDICATION
Status: DC
Start: ? — End: 2019-06-18

## 2019-06-18 MED ORDER — ENOXAPARIN SODIUM 100 MG/ML ~~LOC~~ SOLN
1.00 | SUBCUTANEOUS | Status: DC
Start: 2019-06-18 — End: 2019-06-18

## 2019-06-18 MED ORDER — SODIUM CHLORIDE 0.9 % IV SOLN
10.00 | INTRAVENOUS | Status: DC
Start: ? — End: 2019-06-18

## 2019-06-18 MED ORDER — LISINOPRIL 10 MG PO TABS
10.00 | ORAL_TABLET | ORAL | Status: DC
Start: 2019-06-19 — End: 2019-06-18

## 2019-06-18 MED ORDER — CARVEDILOL 6.25 MG PO TABS
12.50 | ORAL_TABLET | ORAL | Status: DC
Start: 2019-06-18 — End: 2019-06-18

## 2019-06-18 MED ORDER — IPRATROPIUM-ALBUTEROL 0.5-2.5 (3) MG/3ML IN SOLN
3.00 | RESPIRATORY_TRACT | Status: DC
Start: 2019-06-18 — End: 2019-06-18

## 2019-06-18 MED ORDER — WARFARIN SODIUM 5 MG PO TABS
5.00 | ORAL_TABLET | ORAL | Status: DC
Start: 2019-06-18 — End: 2019-06-18

## 2019-06-18 MED ORDER — NITROGLYCERIN 0.4 MG SL SUBL
0.40 | SUBLINGUAL_TABLET | SUBLINGUAL | Status: DC
Start: ? — End: 2019-06-18

## 2019-06-18 MED ORDER — ASPIRIN EC 81 MG PO TBEC
81.00 | DELAYED_RELEASE_TABLET | ORAL | Status: DC
Start: 2019-06-19 — End: 2019-06-18

## 2019-06-18 NOTE — Telephone Encounter (Signed)
Looks like patient was recently hospitalized, so this would be a hospital follow-up appointment.  Can we put him in the 910 slot on Tuesday, September 8?

## 2019-06-18 NOTE — Telephone Encounter (Signed)
Appointment has been made. No further questions at this time.  

## 2019-06-18 NOTE — Telephone Encounter (Addendum)
Dr. Levada Dy left a vm msg. He stated that pt has been under his care for newly dx Afib. As per provider, pt was put on a coumadin therapy. However provider did not state daily amount pt is taking. Pt will need to repeat an INR check no later than Monday, which is a holiday weekend. Provider may contact Dr. Levada Dy for any additional inquiries at [phone]. Pls advise, thanks.

## 2019-06-19 ENCOUNTER — Other Ambulatory Visit: Payer: Self-pay

## 2019-06-19 ENCOUNTER — Ambulatory Visit (INDEPENDENT_AMBULATORY_CARE_PROVIDER_SITE_OTHER): Payer: Medicare Other | Admitting: Pharmacist Clinician (PhC)/ Clinical Pharmacy Specialist

## 2019-06-19 DIAGNOSIS — I480 Paroxysmal atrial fibrillation: Secondary | ICD-10-CM

## 2019-06-19 DIAGNOSIS — Z87891 Personal history of nicotine dependence: Secondary | ICD-10-CM

## 2019-06-19 DIAGNOSIS — I4891 Unspecified atrial fibrillation: Secondary | ICD-10-CM | POA: Insufficient documentation

## 2019-06-19 DIAGNOSIS — Z7901 Long term (current) use of anticoagulants: Secondary | ICD-10-CM | POA: Diagnosis not present

## 2019-06-19 DIAGNOSIS — R0989 Other specified symptoms and signs involving the circulatory and respiratory systems: Secondary | ICD-10-CM

## 2019-06-19 LAB — POCT INR: INR: 1.3 — AB (ref 2.0–3.0)

## 2019-06-19 MED ORDER — GENERIC EXTERNAL MEDICATION
Status: DC
Start: ? — End: 2019-06-19

## 2019-06-23 ENCOUNTER — Telehealth: Payer: Self-pay

## 2019-06-23 ENCOUNTER — Ambulatory Visit (INDEPENDENT_AMBULATORY_CARE_PROVIDER_SITE_OTHER): Payer: Medicare Other | Admitting: Osteopathic Medicine

## 2019-06-23 ENCOUNTER — Other Ambulatory Visit: Payer: Self-pay

## 2019-06-23 ENCOUNTER — Encounter: Payer: Self-pay | Admitting: Osteopathic Medicine

## 2019-06-23 VITALS — BP 190/75 | HR 61 | Temp 97.9°F | Ht 69.0 in | Wt 190.0 lb

## 2019-06-23 DIAGNOSIS — I480 Paroxysmal atrial fibrillation: Secondary | ICD-10-CM

## 2019-06-23 DIAGNOSIS — I10 Essential (primary) hypertension: Secondary | ICD-10-CM | POA: Diagnosis not present

## 2019-06-23 DIAGNOSIS — I25118 Atherosclerotic heart disease of native coronary artery with other forms of angina pectoris: Secondary | ICD-10-CM | POA: Diagnosis not present

## 2019-06-23 DIAGNOSIS — I5032 Chronic diastolic (congestive) heart failure: Secondary | ICD-10-CM | POA: Diagnosis not present

## 2019-06-23 DIAGNOSIS — I509 Heart failure, unspecified: Secondary | ICD-10-CM

## 2019-06-23 HISTORY — DX: Heart failure, unspecified: I50.9

## 2019-06-23 MED ORDER — FUROSEMIDE 20 MG PO TABS: 20.0000 mg | ORAL_TABLET | Freq: Every day | ORAL | 3 refills | Status: DC

## 2019-06-23 NOTE — Patient Instructions (Addendum)
Plan:  Continue monitoring with cardiology for INR and other heart issues & blood pressure.   Let's increase Lasix / furosemide 20 mg from once per day to twice per day AS NEEDED for increased swelling in legs, OR for weight gain 5+ lbs in 1-2 days.

## 2019-06-23 NOTE — Progress Notes (Signed)
HPI: Christopher Armstrong is a 78 y.o. male who  has a past medical history of BPH (benign prostatic hyperplasia), CHF (congestive heart failure) ([place]) (06/23/2019), COPD (chronic obstructive pulmonary disease) with chronic bronchitis ([place]) (08/26/2018), Coronary artery disease of native artery of native heart with stable angina pectoris ([place]) (08/26/2018), Essential hypertension (08/26/2018), History of coronary artery stent placement (08/26/2018), Mixed hyperlipidemia (08/26/2018), Statin myopathy (08/26/2018), and Tobacco use disorder, severe, dependence (08/26/2018).  he presents to [facility] today, 06/23/19,  for chief complaint of: Hospital follow-up: A. fib  Recently hospitalized for paroxysmal atrial fibrillation after noticing significantly elevated heart rate at home.  He was discharged on Coumadin and diltiazem 180 mg daily.  He states that he has stopped taking his lisinopril as the addition of this medicine with the Coumadin was causing him to feel dizzy/lightheaded.  Since stopping the lisinopril, he has not had any of these symptoms.  He is noting some increased swelling in the lower extremities, he said that he was given Lasix in the hospital and lost about 5 to 6 pounds in water weight, has noticed that this has come back +.  No shortness of breath, no orthopnea, no chest pain.   Of note, echocardiogram 06/17/2019 with LVEF 55 to [id number], grade 1 diastolic dysfunction.  Home blood pressures: 160/60, 154/59, 151/57, 148/59, 152/60     Past medical, surgical, social and family history reviewed:  Patient Active Problem List   Diagnosis Date Noted  . CHF (congestive heart failure) ([place]) 06/23/2019  . Atrial fibrillation ([place]) 06/19/2019  . Long term (current) use of anticoagulants 06/19/2019  . COPD (chronic obstructive pulmonary disease) with chronic bronchitis ([place]) 08/26/2018  . Coronary artery disease of native artery of native heart with stable  angina pectoris ([place]) 08/26/2018  . Mixed hyperlipidemia 08/26/2018  . Essential hypertension 08/26/2018  . 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 Tobacco use disorder, severe, dependence 08/26/2018  . History of coronary artery stent placement 08/26/2018  . White coat syndrome with hypertension 08/26/2018  . Eyelid abnormality 08/26/2018  . Lung nodule 08/26/2018  . Abnormal findings on diagnostic imaging of lung 08/26/2018    Past Surgical History:  Procedure Laterality Date  . BLADDER TUMOR EXCISION    . CORONARY ANGIOPLASTY WITH STENT PLACEMENT    . PROSTATE SURGERY    . TONSILLECTOMY      Social History   Tobacco Use  . Smoking status: Current Every Day Smoker    Packs/day: 1.00    Years: 65.00    Pack years: 65.00  . Smokeless tobacco: Never Used  Substance Use Topics  . Alcohol use: Not Currently    Alcohol/week: 0.0 standard drinks    Comment: 0    Family History  Problem Relation Age of Onset  . High blood pressure Mother   . Heart failure Mother   . Lung cancer Father   . High blood pressure Sister      Current medication list and allergy/intolerance information reviewed:    Current Outpatient Medications  Medication Sig Dispense Refill  . albuterol (PROVENTIL HFA;VENTOLIN HFA) 108 (90 Base) MCG/ACT inhaler Inhale 1-2 puffs into the lungs every 6 (six) hours as needed for wheezing or shortness of breath. 2 Inhaler 99  . aspirin EC 81 MG tablet Take 1 tablet (81 mg total) by mouth daily. 90 tablet 3  . budesonide-formoterol (SYMBICORT) 160-4.5 MCG/ACT inhaler Inhale 2 puffs into the lungs 2 (two) times daily.    . carvedilol (COREG) 12.5  MG tablet Take 1 tablet (12.5 mg total) by mouth 2 (two) times daily. 180 tablet 3  . diltiazem (CARDIZEM CD) 180 MG 24 hr capsule Take 180 mg by mouth daily.    . furosemide (LASIX) 20 MG tablet Take 1 tablet (20 mg total) by mouth daily. Add 1 tablet in afternoon/evening as needed for swelling or for weight gain 5 lbs or more in  1-2 days 90 tablet 3  . ipratropium-albuterol (DUONEB) 0.5-2.5 (3) MG/3ML SOLN Take 3 mLs by nebulization 4 (four) times daily. 360 mL 99  . nitroGLYCERIN (NITROSTAT) 0.4 MG SL tablet Place 0.4 mg under the tongue every 5 (five) minutes as needed for chest pain.    . OXYGEN Inhale into the lungs at bedtime.    Marland Kitchen warfarin (COUMADIN) 5 MG tablet Take 5 mg by mouth daily.     No current facility-administered medications for this visit.     Allergies  Allergen Reactions  . Clopidogrel Other (See Comments)    "Purple blood pockets clots in mouth" Blood blisters in roof of mouth, urinary retention   . Ticagrelor Shortness Of Breath and Other (See Comments)  . Atorvastatin Other (See Comments)  . Simvastatin   . Statins Nausea And Vomiting and Other (See Comments)      Review of Systems:  Constitutional:  No  fever, no chills  HEENT: No  headache  Cardiac: No  chest pain, No  pressure, No palpitations, No  Orthopnea  Respiratory:  No  shortness of breath. No  Cough  Gastrointestinal: No  abdominal pain, No  nausea  Musculoskeletal: No new myalgia/arthralgia  Neurologic: No  weakness, No  dizziness  Psychiatric: No  concerns with depression, No  concerns with anxiety  Exam:  BP (!) 190/75 (BP Location: Left Arm, Patient Position: Sitting, Cuff Size: Normal)   Pulse 61   Temp 97.9 F (36.6 C) (Oral)   Ht 5\' 9"  (1.753 m)   Wt 190 lb (86.2 kg)   BMI 28.06 kg/m   Constitutional: VS see above. General Appearance: alert, well-developed, well-nourished, NAD  Neck: No masses, trachea midline. No thyroid enlargement.  Respiratory: Normal respiratory effort. no wheeze, no rhonchi, no rales.  Diminished breath sounds bilaterally in all lung fields  Cardiovascular: S1/S2 normal, no murmur, no rub/gallop auscultated. RRR.  Trace lower extremity edema.  Sounds to be in sinus rhythm right now.    Gastrointestinal: Nontender, no masses. No hepatomegaly, no splenomegaly. No hernia  appreciated. Bowel sounds normal. Rectal exam deferred.   Musculoskeletal: Gait normal. No clubbing/cyanosis of digits.   Neurological: Normal balance/coordination. No tremor   Psychiatric: Normal judgment/insight. Normal mood and affect. Oriented x3.     ASSESSMENT/PLAN: The primary encounter diagnosis was Coronary artery disease of native artery of native heart with stable angina pectoris ([place]). Diagnoses of Paroxysmal atrial fibrillation ([facility]), White coat syndrome with hypertension, and Chronic diastolic congestive heart failure ([place]) were also pertinent to this visit.   Orders Placed This Encounter  Procedures  . COMPLETE METABOLIC PANEL WITH GFR  . CBC  . POCT INR    Meds ordered this encounter  Medications  . furosemide (LASIX) 20 MG tablet    Sig: Take 1 tablet (20 mg total) by mouth daily. Add 1 tablet in afternoon/evening as needed for swelling or for weight gain 5 lbs or more in 1-2 days    Dispense:  90 tablet    Refill:  3    Patient Instructions  Plan:  Continue monitoring with  cardiology for INR and other heart issues & blood pressure.   Let's increase Lasix / furosemide 20 mg from once per day to twice per day AS NEEDED for increased swelling in legs, OR for weight gain 5+ lbs in 1-2 days.          Visit summary with medication list and pertinent instructions was printed for patient to review. All questions at time of visit were answered - patient instructed to contact office with any additional concerns or updates. ER/RTC precautions were reviewed with the patient.     Please note: voice recognition software was used to produce this document, and typos may escape review. Please contact Dr. Sheppard Coil for any needed clarifications.     Follow-up plan: Return in about 3 months (around 09/22/2019) for Physical / Medicare Wellness Check-Up w/ Dr Sheppard Coil ([place] to scheduler OV40 needed) .

## 2019-06-23 NOTE — Telephone Encounter (Signed)
NOTES ON [place], SENT REFERRAL TO SCHEDULING

## 2019-06-24 ENCOUNTER — Ambulatory Visit (INDEPENDENT_AMBULATORY_CARE_PROVIDER_SITE_OTHER): Payer: Medicare Other | Admitting: Pharmacist

## 2019-06-24 DIAGNOSIS — I480 Paroxysmal atrial fibrillation: Secondary | ICD-10-CM | POA: Diagnosis not present

## 2019-06-24 DIAGNOSIS — Z7901 Long term (current) use of anticoagulants: Secondary | ICD-10-CM | POA: Diagnosis not present

## 2019-06-24 LAB — CBC
HCT: 43.3 % (ref 38.5–50.0)
Hemoglobin: 14.5 g/dL (ref 13.2–17.1)
MCH: 31.5 pg (ref 27.0–33.0)
MCHC: 33.5 g/dL (ref 32.0–36.0)
MCV: 93.9 fL (ref 80.0–100.0)
MPV: 13.9 fL — ABNORMAL HIGH (ref 7.5–12.5)
Platelets: 137 10*3/uL — ABNORMAL LOW (ref 140–400)
RBC: 4.61 10*6/uL (ref 4.20–5.80)
RDW: 12.5 % (ref 11.0–15.0)
WBC: 14 10*3/uL — ABNORMAL HIGH (ref 3.8–10.8)

## 2019-06-24 LAB — COMPLETE METABOLIC PANEL WITH GFR
AG Ratio: 1.5 (calc) (ref 1.0–2.5)
ALT: 18 U/L (ref 9–46)
AST: 16 U/L (ref 10–35)
Albumin: 4.1 g/dL (ref 3.6–5.1)
Alkaline phosphatase (APISO): 52 U/L (ref 35–144)
BUN: 15 mg/dL (ref 7–25)
CO2: 31 mmol/L (ref 20–32)
Calcium: 9.1 mg/dL (ref 8.6–10.3)
Chloride: 102 mmol/L (ref 98–110)
Creat: 1.12 mg/dL (ref 0.70–1.18)
GFR, Est African American: 74 mL/min/{1.73_m2} (ref >=60)
GFR, Est Non African American: 64 mL/min/{1.73_m2} (ref >=60)
Globulin: 2.8 g/dL (calc) (ref 1.9–3.7)
Glucose, Bld: 94 mg/dL (ref 65–99)
Potassium: 4.2 mmol/L (ref 3.5–5.3)
Sodium: 139 mmol/L (ref 135–146)
Total Bilirubin: 0.5 mg/dL (ref 0.2–1.2)
Total Protein: 6.9 g/dL (ref 6.1–8.1)

## 2019-06-24 LAB — POCT INR: INR: 2.5 (ref 2.0–3.0)

## 2019-06-24 NOTE — Patient Instructions (Signed)
Continue with 1 tablet daily.  Repeat INR in 1 week *TAKE 2 Laxis (furosemide) tablets daily for 3 days to improve fluid retention and breathing.

## 2019-06-27 ENCOUNTER — Emergency Department (INDEPENDENT_AMBULATORY_CARE_PROVIDER_SITE_OTHER): Payer: Medicare Other

## 2019-06-27 ENCOUNTER — Other Ambulatory Visit: Payer: Self-pay

## 2019-06-27 ENCOUNTER — Emergency Department (INDEPENDENT_AMBULATORY_CARE_PROVIDER_SITE_OTHER)
Admission: EM | Admit: 2019-06-27 | Discharge: 2019-06-27 | Disposition: A | Discharge status: Home / Self Care | Payer: Medicare Other | Source: Home / Self Care | Attending: Emergency Medicine | Admitting: Emergency Medicine

## 2019-06-27 DIAGNOSIS — Z66 Do not resuscitate: Secondary | ICD-10-CM | POA: Diagnosis present

## 2019-06-27 DIAGNOSIS — R42 Dizziness and giddiness: Secondary | ICD-10-CM | POA: Diagnosis not present

## 2019-06-27 DIAGNOSIS — I499 Cardiac arrhythmia, unspecified: Secondary | ICD-10-CM | POA: Diagnosis not present

## 2019-06-27 DIAGNOSIS — E7800 Pure hypercholesterolemia, unspecified: Secondary | ICD-10-CM | POA: Diagnosis not present

## 2019-06-27 DIAGNOSIS — J189 Pneumonia, unspecified organism: Secondary | ICD-10-CM | POA: Diagnosis not present

## 2019-06-27 DIAGNOSIS — J441 Chronic obstructive pulmonary disease with (acute) exacerbation: Secondary | ICD-10-CM | POA: Diagnosis not present

## 2019-06-27 DIAGNOSIS — T380X5A Adverse effect of glucocorticoids and synthetic analogues, initial encounter: Secondary | ICD-10-CM | POA: Diagnosis present

## 2019-06-27 DIAGNOSIS — I480 Paroxysmal atrial fibrillation: Secondary | ICD-10-CM

## 2019-06-27 DIAGNOSIS — E785 Hyperlipidemia, unspecified: Secondary | ICD-10-CM | POA: Diagnosis present

## 2019-06-27 DIAGNOSIS — I25118 Atherosclerotic heart disease of native coronary artery with other forms of angina pectoris: Secondary | ICD-10-CM | POA: Diagnosis not present

## 2019-06-27 DIAGNOSIS — R911 Solitary pulmonary nodule: Secondary | ICD-10-CM | POA: Diagnosis not present

## 2019-06-27 DIAGNOSIS — D72829 Elevated white blood cell count, unspecified: Secondary | ICD-10-CM | POA: Diagnosis not present

## 2019-06-27 DIAGNOSIS — R079 Chest pain, unspecified: Secondary | ICD-10-CM | POA: Diagnosis not present

## 2019-06-27 DIAGNOSIS — J449 Chronic obstructive pulmonary disease, unspecified: Secondary | ICD-10-CM

## 2019-06-27 DIAGNOSIS — J438 Other emphysema: Secondary | ICD-10-CM | POA: Diagnosis not present

## 2019-06-27 DIAGNOSIS — I10 Essential (primary) hypertension: Secondary | ICD-10-CM | POA: Diagnosis not present

## 2019-06-27 DIAGNOSIS — J9621 Acute and chronic respiratory failure with hypoxia: Secondary | ICD-10-CM | POA: Diagnosis present

## 2019-06-27 DIAGNOSIS — R0602 Shortness of breath: Secondary | ICD-10-CM

## 2019-06-27 DIAGNOSIS — R000 Tachycardia, unspecified: Secondary | ICD-10-CM | POA: Diagnosis not present

## 2019-06-27 DIAGNOSIS — Z7901 Long term (current) use of anticoagulants: Secondary | ICD-10-CM | POA: Diagnosis not present

## 2019-06-27 DIAGNOSIS — R05 Cough: Secondary | ICD-10-CM | POA: Diagnosis not present

## 2019-06-27 DIAGNOSIS — I160 Hypertensive urgency: Secondary | ICD-10-CM | POA: Diagnosis present

## 2019-06-27 DIAGNOSIS — I4891 Unspecified atrial fibrillation: Secondary | ICD-10-CM | POA: Diagnosis not present

## 2019-06-27 DIAGNOSIS — Z888 Allergy status to other drugs, medicaments and biological substances status: Secondary | ICD-10-CM | POA: Diagnosis not present

## 2019-06-27 DIAGNOSIS — R069 Unspecified abnormalities of breathing: Secondary | ICD-10-CM | POA: Diagnosis not present

## 2019-06-27 DIAGNOSIS — Z20828 Contact with and (suspected) exposure to other viral communicable diseases: Secondary | ICD-10-CM | POA: Diagnosis present

## 2019-06-27 DIAGNOSIS — Z9981 Dependence on supplemental oxygen: Secondary | ICD-10-CM | POA: Diagnosis not present

## 2019-06-27 DIAGNOSIS — J984 Other disorders of lung: Secondary | ICD-10-CM | POA: Diagnosis not present

## 2019-06-27 DIAGNOSIS — F17210 Nicotine dependence, cigarettes, uncomplicated: Secondary | ICD-10-CM | POA: Diagnosis not present

## 2019-06-27 DIAGNOSIS — Z955 Presence of coronary angioplasty implant and graft: Secondary | ICD-10-CM | POA: Diagnosis not present

## 2019-06-27 DIAGNOSIS — Z87898 Personal history of other specified conditions: Secondary | ICD-10-CM

## 2019-06-27 DIAGNOSIS — Z79899 Other long term (current) drug therapy: Secondary | ICD-10-CM | POA: Diagnosis not present

## 2019-06-27 DIAGNOSIS — Z7982 Long term (current) use of aspirin: Secondary | ICD-10-CM | POA: Diagnosis not present

## 2019-06-27 LAB — POCT CBC W AUTO DIFF (K'VILLE URGENT CARE)

## 2019-06-27 LAB — BASIC METABOLIC PANEL
BUN: 17 mg/dL (ref 7–25)
CO2: 31 mmol/L (ref 20–32)
Calcium: 8.7 mg/dL (ref 8.6–10.3)
Chloride: 102 mmol/L (ref 98–110)
Creat: 1 mg/dL (ref 0.70–1.18)
Glucose, Bld: 95 mg/dL (ref 65–99)
Potassium: 4.1 mmol/L (ref 3.5–5.3)
Sodium: 138 mmol/L (ref 135–146)

## 2019-06-27 LAB — POC SARS CORONAVIRUS 2 AG -  ED: SARS Coronavirus 2 Ag: NEGATIVE

## 2019-06-27 LAB — MAGNESIUM: Magnesium: 2.3 mg/dL (ref 1.5–2.5)

## 2019-06-27 MED ORDER — DILTIAZEM HCL ER COATED BEADS 120 MG PO CP24: 120.0000 mg | ORAL_CAPSULE | Freq: Every day | ORAL | 0 refills | Status: DC

## 2019-06-27 NOTE — Discharge Instructions (Addendum)
We are going to decrease her Cardizem CD. Stop Cardizem CD 180. Start Cardizem CD 120 one a day. Call cardiology office on Monday to get an urgent appointment. Call Dr. Sheppard Coil for a follow-up. Make an appointment to see the pulmonary specialist. You can use 1 L O2 during the day and 2 L at night. Continue with your inhalers. If you have worsening shortness of breath, chest pain or difficulty breathing please call 911 Monitor your weight every day.  If you see an increase of 1 to 2 pounds please take an extra Lasix dose. Please continue to work on smoking cessation.

## 2019-06-27 NOTE — ED Triage Notes (Signed)
Pt c/o worsening SOB since Wednesday. Was in hospital at [facility] 9/1-9/3.

## 2019-06-27 NOTE — ED Provider Notes (Addendum)
Christopher Armstrong CARE    CSN: [id number] Arrival date & time: 06/27/19  0930      History   Chief Complaint Chief Complaint  Patient presents with  . Shortness of Breath    HPI KISHAWN HUTCHERSON is a 78 y.o. male.  Patient has a complicated history.  He has COPD, coronary artery disease, hypertension, severe tobacco use disorder, and paroxysmal atrial fib.  His most recent medication that was added was Cardizem CD.  Since that time he has felt he has had worsening shortness of breath.  The medication was started during his hospitalization from 9/1 to 06/18/2019.  He recently had his Coumadin dosage adjusted.  He has been in contact with Dr. Sheppard Coil who recently increased his Lasix to twice a day.  Today is day 3 of that increase.  He did not take his Cardizem this morning and states he does feel better.  He does feel less short of breath.  He used his nebulizer this morning.  He also has used his albuterol inhaler.  He sleeps on oxygen.  He does chart continues to smoke.  His weight has been stable despite the increase in Lasix.  To his knowledge he has not had any COVID testing done.  He has been on Cardizem 180.  Of note he is also on Coreg beta-blocker.  He has grade 1 diastolic dysfunction HPI  Past Medical History:  Diagnosis Date  . BPH (benign prostatic hyperplasia)   . CHF (congestive heart failure) ([place]) 06/23/2019  . COPD (chronic obstructive pulmonary disease) with chronic bronchitis ([place]) 08/26/2018  . Coronary artery disease of native artery of native heart with stable angina pectoris ([place]) 08/26/2018  . Essential hypertension 08/26/2018  . History of coronary artery stent placement 08/26/2018  . Mixed hyperlipidemia 08/26/2018  . 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 Tobacco use disorder, severe, dependence 08/26/2018    Patient Active Problem List   Diagnosis Date Noted  . CHF (congestive heart failure) ([place]) 06/23/2019  . Atrial fibrillation ([place]) 06/19/2019  . Long term  (current) use of anticoagulants 06/19/2019  . COPD (chronic obstructive pulmonary disease) with chronic bronchitis ([place]) 08/26/2018  . Coronary artery disease of native artery of native heart with stable angina pectoris ([place]) 08/26/2018  . Mixed hyperlipidemia 08/26/2018  . Essential hypertension 08/26/2018  . 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 Tobacco use disorder, severe, dependence 08/26/2018  . History of coronary artery stent placement 08/26/2018  . White coat syndrome with hypertension 08/26/2018  . Eyelid abnormality 08/26/2018  . Lung nodule 08/26/2018  . Abnormal findings on diagnostic imaging of lung 08/26/2018    Past Surgical History:  Procedure Laterality Date  . BLADDER TUMOR EXCISION    . CORONARY ANGIOPLASTY WITH STENT PLACEMENT    . PROSTATE SURGERY    . TONSILLECTOMY         Home Medications    Prior to Admission medications   Medication Sig Start Date End Date Taking? Authorizing Provider  albuterol (PROVENTIL HFA;VENTOLIN HFA) 108 (90 Base) MCG/ACT inhaler Inhale 1-2 puffs into the lungs every 6 (six) hours as needed for wheezing or shortness of breath. 12/19/18   [name], DO  aspirin EC 81 MG tablet Take 1 tablet (81 mg total) by mouth daily. 10/01/18   Lelon Perla, MD  budesonide-formoterol (SYMBICORT) 160-4.5 MCG/ACT inhaler Inhale 2 puffs into the lungs 2 (two) times daily.    [provider]  carvedilol (COREG) 12.5 MG tablet Take 1 tablet (12.5 mg total) by  mouth 2 (two) times daily. 09/25/18   [name], DO  diltiazem (CARDIZEM CD) 120 MG 24 hr capsule Take 1 capsule (120 mg total) by mouth daily. 06/27/19   Darlyne Russian, MD  furosemide (LASIX) 20 MG tablet Take 1 tablet (20 mg total) by mouth daily. Add 1 tablet in afternoon/evening as needed for swelling or for weight gain 5 lbs or more in 1-2 days 06/23/19   [name], DO  ipratropium-albuterol (DUONEB) 0.5-2.5 (3) MG/3ML SOLN Take 3 mLs by nebulization 4 (four)  times daily. 09/25/18   [name], DO  nitroGLYCERIN (NITROSTAT) 0.4 MG SL tablet Place 0.4 mg under the tongue every 5 (five) minutes as needed for chest pain.    [provider]  OXYGEN Inhale into the lungs at bedtime.    [provider]  warfarin (COUMADIN) 5 MG tablet Take 5 mg by mouth daily. 06/18/19   [provider]    Family History Family History  Problem Relation Age of Onset  . High blood pressure Mother   . Heart failure Mother   . Lung cancer Father   . High blood pressure Sister     Social History Social History   Tobacco Use  . Smoking status: Current Every Day Smoker    Packs/day: 1.00    Years: 65.00    Pack years: 65.00  . Smokeless tobacco: Never Used  Substance Use Topics  . Alcohol use: Not Currently    Alcohol/week: 0.0 standard drinks    Comment: 0  . Drug use: Never     Allergies   Clopidogrel, Ticagrelor, Atorvastatin, Simvastatin, and Statins   Review of Systems Review of Systems  Constitutional: Negative for fever.  HENT: Negative for congestion.        No change in taste or smell.  Eyes: Negative.   Respiratory: Positive for cough and shortness of breath.   Cardiovascular:       He has not experienced any worsening chest discomfort  Gastrointestinal: Negative.   Skin:       He has chronic swelling of his lower extremities.     Physical Exam Triage Vital Signs ED Triage Vitals  Enc Vitals Group     BP 06/27/19 0952 (!) 192/71     Pulse Rate 06/27/19 0952 83     Resp 06/27/19 0952 18     Temp 06/27/19 0952 98 F (36.7 C)     Temp Source 06/27/19 0952 Oral     SpO2 06/27/19 0952 95 %     Weight 06/27/19 0953 189 lb 9.5 oz (86 kg)     Height 06/27/19 0953 5\' 9"  (1.753 m)     Head Circumference --      Peak Flow --      Pain Score 06/27/19 0953 0     Pain Loc --      Pain Edu? --      Excl. in [place]? --    No data found.  Updated Vital Signs BP (!) 178/78 (BP Location: Left Arm)   Pulse  83   Temp 98 F (36.7 C) (Oral)   Resp 18   Ht 5\' 9"  (1.753 m)   Wt 84.4 kg   SpO2 93%   BMI 27.47 kg/m   Visual Acuity Right Eye Distance:   Left Eye Distance:   Bilateral Distance:    Right Eye Near:   Left Eye Near:    Bilateral Near:     Physical Exam Constitutional:  Appearance: He is well-developed.  HENT:     Head: Normocephalic.     Mouth/Throat:     Mouth: Mucous membranes are moist.  Cardiovascular:     Rate and Rhythm: Normal rate and regular rhythm.  Pulmonary:     Effort: Pulmonary effort is normal.     Breath sounds: Normal breath sounds.     Comments: Breath sounds were better on the right than the left. Musculoskeletal:     Right lower leg: No edema.     Left lower leg: Edema present.  Skin:    General: Skin is warm and dry.  Neurological:     General: No focal deficit present.     Mental Status: He is alert.    White blood count 13,700.  82% segs.  Hemoglobin 13.6.  Platelet 133,000.  UC Treatments / Results  Labs (all labs ordered are listed, but only abnormal results are displayed) Labs Reviewed  BASIC METABOLIC PANEL  MAGNESIUM  POC SARS CORONAVIRUS 2 ED  POCT CBC W AUTO DIFF ([place])    EKG normal sinus rhythm.  Q waves in 2 3 and aVF consistent with previous anterior infarct.  Rate 74   Radiology Dg Chest 2 View  Result Date: 06/27/2019 CLINICAL DATA:  Shortness of breath and cough since yesterday. Atrial fibrillation. Leukocytosis. EXAM: CHEST - 2 VIEW COMPARISON:  None. FINDINGS: The heart size and mediastinal contours are within normal limits. Pulmonary emphysema noted. No evidence of acute infiltrate or edema. No evidence of pleural effusion. IMPRESSION: COPD.  No active cardiopulmonary disease. Electronically Signed   By: Marlaine Hind M.D.   On: 06/27/2019 10:59    Procedures Procedures (including critical care time)  Medications Ordered in UC Medications - No data to display  Initial Impression /  Assessment and Plan / UC Course  I have reviewed the triage vital signs and the nursing notes. His EKG did not show any signs of atrial fibrillation or acute changes.  Chest x-ray shows no pneumonia.  It is possible that the diltiazem is related to his worsening shortness of breath.  He does have diastolic dysfunction with a maintained ejection fraction.  We will decrease his Cardizem from 1 80-1 20.  He will follow-up with Dr. Sheppard Coil next week.  He will call the cardiology office on Monday for follow-up.  He also agreed to follow-up pulmonary specialist regarding his pulmonary nodules.  According to the daughter he did have low magnesium when he was in the hospital and we will follow-up on this.  He is going to check his weight daily and take extra Lasix if he sees any bump in his weight or fluid accumulation.  He will continue to have his Coumadin monitored through the Coumadin clinic.  Basic metabolic panel and magnesium level were checked today Pertinent labs & imaging results that were available during my care of the patient were reviewed by me and considered in my medical decision making (see chart for details).      Final Clinical Impressions(s) / UC Diagnoses   Final diagnoses:  SOB (shortness of breath)  Paroxysmal atrial fibrillation (HCC)  Solitary pulmonary nodule  Leukocytosis, unspecified type  History of multiple pulmonary nodules     Discharge Instructions     We are going to decrease her Cardizem CD. Stop Cardizem CD 180. Start Cardizem CD 120 one a day. Call cardiology office on Monday to get an urgent appointment. Call Dr. Sheppard Coil for a follow-up. Make an appointment to  see the pulmonary specialist. You can use 1 L O2 during the day and 2 L at night. Continue with your inhalers. If you have worsening shortness of breath, chest pain or difficulty breathing please call 911 Monitor your weight every day.  If you see an increase of 1 to 2 pounds please take an extra  Lasix dose. Please continue to work on smoking cessation.    ED Prescriptions    Medication Sig Dispense Auth. Provider   diltiazem (CARDIZEM CD) 120 MG 24 hr capsule  (Status: Discontinued) Take 1 capsule (120 mg total) by mouth daily. 30 capsule Darlyne Russian, MD   diltiazem (CARDIZEM CD) 120 MG 24 hr capsule Take 1 capsule (120 mg total) by mouth daily. 30 capsule Darlyne Russian, MD     Controlled Substance Prescriptions [place] Controlled Substance Registry consulted? Not Applicable   Darlyne Russian, MD 06/27/19 1308    Darlyne Russian, MD 06/27/19 1321

## 2019-06-28 ENCOUNTER — Telehealth: Payer: Self-pay

## 2019-06-28 NOTE — Telephone Encounter (Signed)
Called pt to check on status after UC visit. Pt informed me he is currently in the hospital. After UC visit, he went to pick up his meds and got home, went into [facility] and had an ambulance come pick him up. Pt states he is doing well though. Advised to call back if any questions.

## 2019-07-06 NOTE — Progress Notes (Signed)
Cardiology Office Note   Date:  07/07/2019   ID:  Christopher Armstrong, DOB 02-05-1941, MRN [id number]  PCP:  [name], DO  Cardiologist: Dr. Stanford Breed CC: Post hospitalization    History of Present Illness: Christopher Armstrong is a 78 y.o. male who presents for ongoing assessment and management of coronary artery disease.  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  The patient had PCI of his right coronary artery with 3 stents.  Echocardiogram at that time revealed an EF of 60% to 65%.  Follow-up nuclear medicine study in January 2008 showed hyperdynamic LV function, with a fixed perfusion defect in the inferior wall and possible partially reversible apical perfusion defect.  Follow-up chest CT in November 2019 showed multiple pulmonary nodules suggestive of inflammatory infectious process but malignancy could not be excluded.  A PET CT was recommended.  He has other history to include O2 dependent COPD BPH, and diastolic CHF.  He was last seen in the office on 10/01/2018 by Dr. Stanford Breed to be established with our practice.  He was placed on aspirin 81 mg daily, noted to be intolerant of statins, he was noted to have bilateral carotid bruits and also an abdominal bruit and therefore an abdominal ultrasound was ordered to exclude aneurysm.  These were not completed at the time of this office visit.  He apparently was admitted to [facility] for complaints of shortness of breath on 06/16/2019.  He was diagnosed with paroxysmal atrial fibrillation, started on diltiazem drip.Marland Kitchen  He was transitioned to oral diltiazem diltiazem 180 mg daily, and was discharged in normal sinus rhythm., Echocardiogram completed on 06/17/2019 revealed an LVEF of 55% to 60% with no valvular abnormalities with grade 1/4 diastolic dysfunction.    He was treated with IV Lasix during his stay  due to volume overload.  He was continued on Lasix 20 mg daily.Coumadin 5 mg daily CHADS VASC Score of 5,, was continued on aspirin and oxygen.  He was to follow-up with cardiology in the setting of diastolic heart failure and for ongoing management of atrial fib and Coumadin dosing  The patient was seen at [facility] urgent care on 06/27/2019 for complaints of shortness of breath which had worsened since adding diltiazem.  He stopped taking the diltiazem.  He has been in contact with his primary care physician Dr. Sheppard Coil who increased his Lasix to 20 mg twice daily.  He was readmitted to [facility] after being seen in urgent clinic and given additional diuresis with medication adjustments.  Dry weight now is 184 pounds.  During most recent hospitalization, diltiazem was decreased from 180 mg daily to 120 mg daily.  He was to follow-up with pulmonology and PCP.  He was noted to be hypertensive with blood pressures of 192/71 INR 1.1, he was not found to be anemic with a hemoglobin of 15.7 hematocrit of 48.6, creatinine 1.30, potassium 3.6.  He is here today for follow-up.  He is to be followed in our office for Coumadin clinic, and this will be his first visit with them as well.  He comes with a multitude of questions, anxieties about his irregular heart rhythm, and lengthy stories of his hospitalizations.  His wife who is with him is very concerned about his symptoms and frequent hospitalizations over the last month.  He denies any bleeding or excessive bruising.  Since being discharged 5  days ago he has not had any recurrence of rapid heart rhythm.  Patient also states that he has some pulmonary nodules that were found incidentally but he is been afraid to follow-up with pulmonary as concerned that it may be cancerous.  He is thinking about seeing one, once his heart rate improves.  Past Medical History:  Diagnosis Date  . BPH (benign prostatic hyperplasia)   . CHF (congestive heart failure)  ([place]) 06/23/2019  . COPD (chronic obstructive pulmonary disease) with chronic bronchitis ([place]) 08/26/2018  . Coronary artery disease of native artery of native heart with stable angina pectoris ([place]) 08/26/2018  . Essential hypertension 08/26/2018  . History of coronary artery stent placement 08/26/2018  . Mixed hyperlipidemia 08/26/2018  . 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 Tobacco use disorder, severe, dependence 08/26/2018    Past Surgical History:  Procedure Laterality Date  . BLADDER TUMOR EXCISION    . CORONARY ANGIOPLASTY WITH STENT PLACEMENT    . PROSTATE SURGERY    . TONSILLECTOMY       Current Outpatient Medications  Medication Sig Dispense Refill  . albuterol (PROVENTIL HFA;VENTOLIN HFA) 108 (90 Base) MCG/ACT inhaler Inhale 1-2 puffs into the lungs every 6 (six) hours as needed for wheezing or shortness of breath. 2 Inhaler 99  . aspirin EC 81 MG tablet Take 1 tablet (81 mg total) by mouth daily. 90 tablet 3  . budesonide-formoterol (SYMBICORT) 160-4.5 MCG/ACT inhaler Inhale 2 puffs into the lungs 2 (two) times daily.    . carvedilol (COREG) 12.5 MG tablet Take 1 tablet (12.5 mg total) by mouth 2 (two) times daily. 180 tablet 3  . diltiazem (CARDIZEM CD) 120 MG 24 hr capsule Take 1 capsule (120 mg total) by mouth daily. 30 capsule 0  . ipratropium-albuterol (DUONEB) 0.5-2.5 (3) MG/3ML SOLN Take 3 mLs by nebulization 4 (four) times daily. 360 mL 99  . lisinopril (ZESTRIL) 20 MG tablet Take 1 tablet by mouth daily.    . magnesium hydroxide (MILK OF MAGNESIA) 400 MG/5ML suspension Take 30 mLs by mouth as needed.    . nitroGLYCERIN (NITROSTAT) 0.4 MG SL tablet Place 0.4 mg under the tongue every 5 (five) minutes as needed for chest pain.    . OXYGEN Inhale into the lungs at bedtime.    Marland Kitchen warfarin (COUMADIN) 5 MG tablet Take 5 mg by mouth daily.    Marland Kitchen diltiazem (CARDIZEM) 30 MG tablet Take 1 tablet (30 mg total) by mouth as needed. 30 tablet 2  . furosemide (LASIX) 20 MG tablet Take 1  tablet (20 mg total) by mouth daily. 90 tablet 3  . potassium chloride SA (KLOR-CON M20) 20 MEQ tablet Take 1 tablet (20 mEq total) by mouth daily. 30 tablet 6   No current facility-administered medications for this visit.     Allergies:   Clopidogrel, Ticagrelor, Atorvastatin, Simvastatin, and Statins    Social History:  The patient  reports that he has been smoking. He has a 65.00 pack-year smoking history. He has never used smokeless tobacco. He reports previous alcohol use. He reports that he does not use drugs.   Family History:  The patient's family history includes Heart failure in his mother; High blood pressure in his mother and sister; Lung cancer in his father.    ROS: All other systems are reviewed and negative. Unless otherwise mentioned in H&P    PHYSICAL EXAM: VS:  BP (!) 146/62   Pulse 67   Temp (!) 97.5 F (36.4 C)   Ht  '5\' 9"'  (1.753 m)   Wt 188 lb (85.3 kg)   BMI 27.76 kg/m  , BMI Body mass index is 27.76 kg/m.   GEN: Well nourished, well developed, in no acute distress HEENT: normal Neck: no JVD, carotid bruits, or masses Cardiac: RRR; no murmurs, rubs, or gallops, 2+ pretibial edema into the ankles. Respiratory:  Clear to auscultation bilaterally, normal work of breathing GI: soft, nontender, mildly distended, + BS MS: no deformity or atrophy Skin: warm and dry, no rash Neuro:  Strength and sensation are intact Psych: euthymic mood, full affect   EKG: Sinus rhythm, rate of 67 bpm, with PVCs.  Mild LVH.  Recent Labs: 09/25/2018: TSH 1.59 06/23/2019: ALT 18; Hemoglobin 14.5; Platelets 137 06/27/2019: BUN 17; Creat 1.00; Magnesium 2.3; Potassium 4.1; Sodium 138    Lipid Panel No results found for: CHOL, TRIG, HDL, CHOLHDL, VLDL, LDLCALC, LDLDIRECT    Wt Readings from Last 3 Encounters:  07/07/19 188 lb (85.3 kg)  06/27/19 186 lb (84.4 kg)  06/23/19 190 lb (86.2 kg)      Other studies Reviewed: I have extensively reviewed all available records  from [place] [facility] [place]. Discharge summary not downloaded yet.   Echocardiogram  Left Ventricle: Systolic function is normal with an ejection fraction  of 55-65%. Tricuspid Valve: There is mild regurgitation. no pulmonary HTN    ASSESSMENT AND PLAN:  1.  Paroxysmal atrial fibrillation: Patient has had 2 hospitalizations in September 2020 with new onset atrial fibrillation.  Initially diagnosed hospitalization on 06/16/2019.  He was started on Coumadin, diltiazem,.  He began to have chest discomfort with bradycardia hospitalization a week after discharge with rapid heart rhythm and CHF.  The patient was given IV diuresis and was not sent home on diuretic therapy.  Creatinine 1.3 on discharge.  Dry weight 184 pounds.  He is currently in sinus rhythm with PVCs.  Echocardiogram revealed LVEF of 55 to 65% during recent hospitalization.  The patient has no issues with bleeding or melena on Coumadin.  He is being established in our office today for management.  The patient is quite concerned about recurrent bouts of rapid atrial fibrillation.  I will keep him on diltiazem 120 mg daily but also give him 30 mg diltiazem which she can take as needed for rapid heart rate but he is to call us if this occurs.  He requests hospitalization at [facility] if necessary as he has lost confidence in [facility].  2.  Oxygen dependent COPD: The patient normally sleeps in a recliner, uses oxygen at night while he sleeps and occasionally during the day.  He generally does not use oxygen when he is out in public.  He is being very careful concerning COVID and is rarely out in public unless is going to physicians office.  He did have abnormal CT scan of the chest, with pulmonary nodules largest measuring 0.9 cm with findings suspected to represent inflammatory or infectious etiology, with malignancy less likely but not excluded.  He was to have a follow-up CT scan of the chest in 8 to 10 weeks.  He was found to  have emphysema.  Of note the patient also had a lesion with peripheral enhancement in the liver suggestive of a hemangioma.  He is advised to follow-up with PCP and/or pulmonologist for ongoing management.  3.  Hypertension: The patient continues on carvedilol, diltiazem, and lisinopril.  Blood pressure is slightly elevated today as he is very nervous.  No changes in his  medication regimen at this time.  4.  Chronic diastolic CHF: He has gained 4 to 6 pounds since discharge.  He will be given Lasix 20 mg to take daily.  He is to weigh himself daily and avoid salty foods.  The patient will call us if his weight drops to less than 180, as this may indicate dehydration.  I will have a follow-up be met in 1 week.  Potassium 20 mEq is prescribed for him but he is not to take it until lab work is returned.   Current medicines are reviewed at length with the patient today.  I have spent over 45 minutes with this patient reviewing his hospitalization, answering multiple questions, and discussing our treatment plan.   Labs/ tests ordered today include: BMET.  Phill Myron. West Pugh, ANP, Advanced Pain Management   07/07/2019 10:58 AM    [facility] [place] [address] Office [phone] Fax [phone]

## 2019-07-07 ENCOUNTER — Other Ambulatory Visit: Payer: Self-pay

## 2019-07-07 ENCOUNTER — Ambulatory Visit (INDEPENDENT_AMBULATORY_CARE_PROVIDER_SITE_OTHER): Payer: Medicare Other | Admitting: Adult Health

## 2019-07-07 ENCOUNTER — Ambulatory Visit (INDEPENDENT_AMBULATORY_CARE_PROVIDER_SITE_OTHER): Payer: Medicare Other | Admitting: Pharmacist

## 2019-07-07 ENCOUNTER — Encounter: Payer: Self-pay | Admitting: Adult Health

## 2019-07-07 VITALS — BP 146/62 | HR 67 | Temp 97.5°F | Ht 69.0 in | Wt 188.0 lb

## 2019-07-07 DIAGNOSIS — I25118 Atherosclerotic heart disease of native coronary artery with other forms of angina pectoris: Secondary | ICD-10-CM

## 2019-07-07 DIAGNOSIS — I480 Paroxysmal atrial fibrillation: Secondary | ICD-10-CM

## 2019-07-07 DIAGNOSIS — Z79899 Other long term (current) drug therapy: Secondary | ICD-10-CM | POA: Diagnosis not present

## 2019-07-07 DIAGNOSIS — Z7901 Long term (current) use of anticoagulants: Secondary | ICD-10-CM

## 2019-07-07 DIAGNOSIS — J449 Chronic obstructive pulmonary disease, unspecified: Secondary | ICD-10-CM

## 2019-07-07 LAB — POCT INR: INR: 4.4 — AB (ref 2.0–3.0)

## 2019-07-07 MED ORDER — FUROSEMIDE 20 MG PO TABS: 20.0000 mg | ORAL_TABLET | Freq: Every day | ORAL | 3 refills | Status: DC

## 2019-07-07 MED ORDER — POTASSIUM CHLORIDE CRYS ER 20 MEQ PO TBCR: 20.0000 meq | EXTENDED_RELEASE_TABLET | Freq: Every day | ORAL | 6 refills | Status: DC

## 2019-07-07 MED ORDER — DILTIAZEM HCL 30 MG PO TABS: 30.0000 mg | ORAL_TABLET | ORAL | 2 refills | Status: DC | PRN

## 2019-07-07 NOTE — Patient Instructions (Addendum)
Medication Instructions:  START- Lasix 20 mg by mouth daily START- Potassium 20 meq by mouth daily START- Diltiazem 30 mg by mouth as needed   If you need a refill on your cardiac medications before your next appointment, please call your pharmacy.  Labwork: BMP in 1 week HERE IN OUR OFFICE AT [facility]   You will NOT need to fast   Take the provided lab slips with you to the lab for your blood draw.   When you have your labs (blood work) drawn today and your tests are completely normal, you will receive your results only by MyChart Message (if you have MyChart) -OR-  A paper copy in the mail.  If you have any lab test that is abnormal or we need to change your treatment, we will call you to review these results.  Testing/Procedures: None Ordered  Follow-Up: . Your physician recommends that you schedule a follow-up appointment in: 1 Month with Christopher Armstrong October 26th @ 10:15 am   At [facility], you and your health needs are our priority.  As part of our continuing mission to provide you with exceptional heart care, we have created designated Provider Care Teams.  These Care Teams include your primary Cardiologist (physician) and Advanced Practice Providers (APPs -  Physician Assistants and Nurse Practitioners) who all work together to provide you with the care you need, when you need it.  Thank you for choosing [facility] at [facility]!!

## 2019-07-08 ENCOUNTER — Telehealth: Payer: Self-pay | Admitting: Adult Health

## 2019-07-08 NOTE — Telephone Encounter (Signed)
New Message    Pt c/o medication issue:  1. Name of Medication: Potassium Chloride 20mg   2. How are you currently taking this medication (dosage and times per day)? t tablet by mouth daily  3. Are you having a reaction (difficulty breathing--STAT)? No  4. What is your medication issue? Patient states he doesn't know how to take medication and wants to speak to a nurse about the issue.

## 2019-07-08 NOTE — Telephone Encounter (Signed)
Patient called, was seen in office yesterday and states he is confused on if he is to start his medication potassium yet.  I advised of note from NP-  I will have a follow-up be met in 1 week.  Potassium 20 mEq is prescribed for him but he is not to take it until lab work is returned.  Patient advised to wait until he had the blood work in 1 week, and then she would let him know once that was back. Patient verbalized understanding.

## 2019-07-14 ENCOUNTER — Ambulatory Visit (INDEPENDENT_AMBULATORY_CARE_PROVIDER_SITE_OTHER): Payer: Medicare Other | Admitting: Pharmacist Clinician (PhC)/ Clinical Pharmacy Specialist

## 2019-07-14 ENCOUNTER — Other Ambulatory Visit: Payer: Self-pay

## 2019-07-14 DIAGNOSIS — Z79899 Other long term (current) drug therapy: Secondary | ICD-10-CM | POA: Diagnosis not present

## 2019-07-14 DIAGNOSIS — Z7901 Long term (current) use of anticoagulants: Secondary | ICD-10-CM | POA: Diagnosis not present

## 2019-07-14 DIAGNOSIS — I480 Paroxysmal atrial fibrillation: Secondary | ICD-10-CM | POA: Diagnosis not present

## 2019-07-14 DIAGNOSIS — I25118 Atherosclerotic heart disease of native coronary artery with other forms of angina pectoris: Secondary | ICD-10-CM | POA: Diagnosis not present

## 2019-07-14 LAB — BASIC METABOLIC PANEL
BUN/Creatinine Ratio: 11 (ref 10–24)
BUN: 13 mg/dL (ref 8–27)
CO2: 27 mmol/L (ref 20–29)
Calcium: 9.1 mg/dL (ref 8.6–10.2)
Chloride: 100 mmol/L (ref 96–106)
Creatinine, Ser: 1.14 mg/dL (ref 0.76–1.27)
GFR calc Af Amer: 72 mL/min/{1.73_m2} (ref >59)
GFR calc non Af Amer: 63 mL/min/{1.73_m2} (ref >59)
Glucose: 89 mg/dL (ref 65–99)
Potassium: 4.5 mmol/L (ref 3.5–5.2)
Sodium: 141 mmol/L (ref 134–144)

## 2019-07-14 LAB — POCT INR: INR: 2.3 (ref 2.0–3.0)

## 2019-07-20 ENCOUNTER — Telehealth: Payer: Self-pay | Admitting: Pharmacist

## 2019-07-20 NOTE — Telephone Encounter (Signed)
Patient Christopher Armstrong called the pharmacy to cancel his INR appointment due to having symptoms of weakness, and trouble breathing. Christopher Armstrong believes the cause is his COPD. Also, Christopher Armstrong states that he does not have anyone to take him the Coumadin clinic today. Christopher Armstrong would like to reschedule for Friday (07/24/19) but would like pharmacy to call and confirm.  Marlowe Aschoff Student-PharmD

## 2019-07-21 NOTE — Telephone Encounter (Signed)
Patient re-scheduled for Thursday and daily dose clarified until then.

## 2019-07-22 ENCOUNTER — Ambulatory Visit (INDEPENDENT_AMBULATORY_CARE_PROVIDER_SITE_OTHER): Payer: Medicare Other | Admitting: Pharmacist Clinician (PhC)/ Clinical Pharmacy Specialist

## 2019-07-22 ENCOUNTER — Other Ambulatory Visit: Payer: Self-pay

## 2019-07-22 DIAGNOSIS — I480 Paroxysmal atrial fibrillation: Secondary | ICD-10-CM | POA: Diagnosis not present

## 2019-07-22 DIAGNOSIS — Z7901 Long term (current) use of anticoagulants: Secondary | ICD-10-CM | POA: Diagnosis not present

## 2019-07-22 LAB — POCT INR: INR: 2.1 (ref 2.0–3.0)

## 2019-07-24 ENCOUNTER — Other Ambulatory Visit: Payer: Self-pay | Admitting: Cardiology

## 2019-07-24 MED ORDER — WARFARIN SODIUM 5 MG PO TABS: ORAL_TABLET | ORAL | 1 refills | Status: DC

## 2019-07-27 ENCOUNTER — Other Ambulatory Visit: Payer: Self-pay

## 2019-07-27 MED ORDER — WARFARIN SODIUM 5 MG PO TABS: ORAL_TABLET | ORAL | 0 refills | Status: DC

## 2019-07-29 ENCOUNTER — Other Ambulatory Visit: Payer: Self-pay

## 2019-07-29 MED ORDER — WARFARIN SODIUM 5 MG PO TABS: ORAL_TABLET | ORAL | 0 refills | Status: DC

## 2019-08-09 NOTE — Progress Notes (Signed)
Cardiology Office Note   Date:  08/10/2019   ID:  Christopher Armstrong, DOB [birth year], MRN [id number]  PCP:  [name], DO  Cardiologist: Dr. Stanford Breed CC: Follow Up   History of Present Illness: Christopher Armstrong is a 78 y.o. male who presents for ongoing assessment and management of CAD.  History of acute inferior MI [id number] which was complicated by complete heart block requiring temporary pacemaker.  The patient has PCI of his RCA with 3 stents.  Other history includes COPD on oxygen, BPH, diastolic CHF.  And recently diagnosed with paroxysmal atrial fibrillation during recent hospitalization to [facility] on 06/16/2019.  The patient is on Coumadin therapy for anticoagulation.  Coumadin is followed by our Coumadin clinic at [facility] office  Follow-up nuclear medicine study in January 2008 showed hyperdynamic LV function, with a fixed perfusion defect in the inferior wall and possible partially reversible apical perfusion defect.  Follow-up chest CT in November 2019 showed multiple pulmonary nodules suggestive of inflammatory infectious process but malignancy could not be excluded.  A PET CT was recommended.  He had a second admission to [facility] in the setting of decompensated heart failure, he was diuresed and diltiazem was decreased from 180 mg to 120 mg daily.  Dry weight 184 pounds.  He was to be followed by pulmonology, CT scan revealed some pulmonary nodules that were found incidentally.  He was reluctant to follow-up with pulmonology for fears of being diagnosed with cancer.  On last office visit dated 07/07/2019 he was in normal sinus rhythm with PVCs.  Repeat echocardiogram revealed an EF of 55 to 65%.  He was not have any issues with bleeding on Coumadin.  He was concerned about going in and out of atrial fibrillation although he was uncertain when these episodes occurred.    He states that he could sometimes feel his heart rate increase but he could not tell  if it was irregular.  He was to continue diltiazem at 120 mg daily, but I also gave him 30 mg of diltiazem to take if he has rapid heart rate that does not go away on its own.  He is to call us if he has to take frequent extra doses of 30 mg diltiazem.  He was encouraged to follow-up with his pulmonologist for ongoing management.  He is doing well today with the exception of some mild lower extremity edema and has run out of of his lisinopril.  He states that he called the pharmacy over the weekend but did not have any refills left.  Blood pressure is reflective of this today.  He will also have Coumadin clinic see him for Coumadin therapy titration and labs.  Past Medical History:  Diagnosis Date  . BPH (benign prostatic hyperplasia)   . CHF (congestive heart failure) ([place]) 06/23/2019  . COPD (chronic obstructive pulmonary disease) with chronic bronchitis ([place]) 08/26/2018  . Coronary artery disease of native artery of native heart with stable angina pectoris ([place]) 08/26/2018  . Essential hypertension 08/26/2018  . History of coronary artery stent placement 08/26/2018  . Mixed hyperlipidemia 08/26/2018  . 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 Tobacco use disorder, severe, dependence 08/26/2018    Past Surgical History:  Procedure Laterality Date  . BLADDER TUMOR EXCISION    . CORONARY ANGIOPLASTY WITH STENT PLACEMENT    . PROSTATE SURGERY    . TONSILLECTOMY       Current Outpatient Medications  Medication Sig Dispense Refill  . albuterol (PROVENTIL  HFA;VENTOLIN HFA) 108 (90 Base) MCG/ACT inhaler Inhale 1-2 puffs into the lungs every 6 (six) hours as needed for wheezing or shortness of breath. 2 Inhaler 99  . aspirin EC 81 MG tablet Take 1 tablet (81 mg total) by mouth daily. 90 tablet 3  . budesonide-formoterol (SYMBICORT) 160-4.5 MCG/ACT inhaler Inhale 2 puffs into the lungs 2 (two) times daily.    . carvedilol (COREG) 12.5 MG tablet Take 1 tablet (12.5 mg total) by mouth 2 (two) times daily.  180 tablet 3  . diltiazem (CARDIZEM CD) 120 MG 24 hr capsule Take 1 capsule (120 mg total) by mouth daily. 30 capsule 0  . diltiazem (CARDIZEM) 30 MG tablet Take 1 tablet (30 mg total) by mouth as needed. 30 tablet 2  . furosemide (LASIX) 20 MG tablet Take 1 tablet (20 mg total) by mouth daily. 90 tablet 3  . ipratropium-albuterol (DUONEB) 0.5-2.5 (3) MG/3ML SOLN Take 3 mLs by nebulization 4 (four) times daily. 360 mL 99  . lisinopril (ZESTRIL) 20 MG tablet Take 1 tablet (20 mg total) by mouth daily. 90 tablet 3  . magnesium hydroxide (MILK OF MAGNESIA) 400 MG/5ML suspension Take 30 mLs by mouth as needed.    . nitroGLYCERIN (NITROSTAT) 0.4 MG SL tablet Place 0.4 mg under the tongue every 5 (five) minutes as needed for chest pain.    . OXYGEN Inhale into the lungs at bedtime.    . potassium chloride SA (KLOR-CON M20) 20 MEQ tablet Take 1 tablet (20 mEq total) by mouth daily. 30 tablet 6  . warfarin (COUMADIN) 5 MG tablet Take 1/2 to 1 tablet daily as directed by Coumadin clinic 90 tablet 0   No current facility-administered medications for this visit.     Allergies:   Clopidogrel, Ticagrelor, Atorvastatin, Simvastatin, and Statins    Social History:  The patient  reports that he has been smoking. He has a 65.00 pack-year smoking history. He has never used smokeless tobacco. He reports previous alcohol use. He reports that he does not use drugs.   Family History:  The patient's family history includes Heart failure in his mother; High blood pressure in his mother and sister; Lung cancer in his father.    ROS: All other systems are reviewed and negative. Unless otherwise mentioned in H&P    PHYSICAL EXAM: VS:  BP (!) 192/75   Pulse 82   Ht 5\' 9"  (1.753 m)   Wt 188 lb (85.3 kg)   SpO2 98%   BMI 27.76 kg/m  , BMI Body mass index is 27.76 kg/m. GEN: Well nourished, well developed, in no acute distress HEENT: normal Neck: no JVD, carotid bruits, or masses Cardiac: RRR; soft 1/6  systolic murmurs, rubs, or gallops, 1+ pretibial dependent edema  Respiratory:  Clear to auscultation bilaterally, mildly increased work of breathing, wearing mask.  (Patient states he breathes better when he does not wear his mask when he is at home or in his truck). GI: soft, nontender, nondistended, + BS MS: no deformity or atrophy Skin: warm and dry, no rash Neuro:  Strength and sensation are intact Psych: euthymic mood, full affect   EKG: Not completed this office visit  Recent Labs: 09/25/2018: TSH 1.59 06/23/2019: ALT 18; Hemoglobin 14.5; Platelets 137 06/27/2019: Magnesium 2.3 07/14/2019: BUN 13; Creatinine, Ser 1.14; Potassium 4.5; Sodium 141    Lipid Panel No results found for: CHOL, TRIG, HDL, CHOLHDL, VLDL, LDLCALC, LDLDIRECT    Wt Readings from Last 3 Encounters:  08/10/19 188 lb (  85.3 kg)  07/07/19 188 lb (85.3 kg)  06/27/19 186 lb (84.4 kg)      Other studies Reviewed: None  ASSESSMENT AND PLAN:  1. PAF: HR is regular today. He has not had to take any extra doses of his diltiazem for rapid HR. He will continue on current dose 120 mg daily with 30 mg for breakthrough HR elevation. He is being seen by coumadin clinic today for INR and dosing.   2. CAD: He is without complaint of recurrent chest pain. DOE is baseline for him with COPD. He will continue on coreg 12.5 mg BID. Watch with COPD. May need to change to cardioselective BB if his breathing becomes an issue.   3. Hypertension: Has run out of lisinopril.  Refills, 90 day supply is provide for him today. He is due to see PCP for Medicare Wellness visit next month. Follow BP at that time. With labs.   4. Chronic Diastolic CHF:  He does have some mild dependent edema, which appears to be associated with CCB. Weight is up 4 lbs, but he has not taken hs diuretic due to the appointment to day. Will follow.   Current medicines are reviewed at length with the patient today.    Labs/ tests ordered today include: None   Christopher Armstrong, Christopher Armstrong, Christopher Armstrong   08/10/2019 11:11 AM    [address] Office [phone] Fax [phone]  Notice: This dictation was prepared with Dragon dictation along with smaller phrase technology. Any transcriptional errors that result from this process are unintentional and may not be corrected upon review.

## 2019-08-10 ENCOUNTER — Other Ambulatory Visit: Payer: Self-pay

## 2019-08-10 ENCOUNTER — Ambulatory Visit (INDEPENDENT_AMBULATORY_CARE_PROVIDER_SITE_OTHER): Payer: Medicare Other | Admitting: Pharmacist

## 2019-08-10 ENCOUNTER — Ambulatory Visit (INDEPENDENT_AMBULATORY_CARE_PROVIDER_SITE_OTHER): Payer: Medicare Other | Admitting: Adult Health

## 2019-08-10 ENCOUNTER — Encounter: Payer: Self-pay | Admitting: Adult Health

## 2019-08-10 VITALS — BP 192/75 | HR 82 | Ht 69.0 in | Wt 188.0 lb

## 2019-08-10 DIAGNOSIS — J449 Chronic obstructive pulmonary disease, unspecified: Secondary | ICD-10-CM | POA: Diagnosis not present

## 2019-08-10 DIAGNOSIS — I2510 Atherosclerotic heart disease of native coronary artery without angina pectoris: Secondary | ICD-10-CM | POA: Diagnosis not present

## 2019-08-10 DIAGNOSIS — Z7901 Long term (current) use of anticoagulants: Secondary | ICD-10-CM | POA: Diagnosis not present

## 2019-08-10 DIAGNOSIS — I480 Paroxysmal atrial fibrillation: Secondary | ICD-10-CM

## 2019-08-10 DIAGNOSIS — I5032 Chronic diastolic (congestive) heart failure: Secondary | ICD-10-CM | POA: Diagnosis not present

## 2019-08-10 DIAGNOSIS — I10 Essential (primary) hypertension: Secondary | ICD-10-CM | POA: Diagnosis not present

## 2019-08-10 LAB — POCT INR: INR: 2.2 (ref 2.0–3.0)

## 2019-08-10 MED ORDER — LISINOPRIL 20 MG PO TABS: 20.0000 mg | ORAL_TABLET | Freq: Every day | ORAL | 3 refills | Status: DC

## 2019-08-10 NOTE — Patient Instructions (Signed)
Vitamin K Foods and Warfarin Warfarin is a blood thinner (anticoagulant). Anticoagulant medicines help prevent the formation of blood clots. These medicines work by decreasing the activity of vitamin K, which promotes normal blood clotting. When you take warfarin, problems can occur from suddenly increasing or decreasing the amount of vitamin K that you eat from one day to the next. Problems may include:  Blood clots.  Bleeding. What general guidelines do I need to follow? To avoid problems when taking warfarin:  Eat a balanced diet that includes: ? Fresh fruits and vegetables. ? Whole grains. ? Low-fat dairy products. ? Lean proteins, such as fish, eggs, and lean cuts of meat.  Keep your intake of vitamin K consistent from day to day. To do this: ? Avoid eating large amounts of vitamin K one day and low amounts of vitamin K the next day. ? If you take a multivitamin that contains vitamin K, be sure to take it every day. ? Know which foods contain vitamin K. Use the lists below to understand serving sizes and the amount of vitamin K in one serving.  Avoid major changes in your diet. If you are going to change your diet, talk with your health care provider before making changes.  Work with a nutrition specialist (dietitian) to develop a meal plan that works best for you.  High vitamin K foods Foods that are high in vitamin K contain more than 100 mcg (micrograms) per serving. These include:  Broccoli (cooked) -  cup has 110 mcg.  Brussels sprouts (cooked) -  cup has 109 mcg.  Greens, beet (cooked) -  cup has 350 mcg.  Greens, collard (cooked) -  cup has 418 mcg.  Greens, turnip (cooked) -  cup has 265 mcg.  Green onions or scallions -  cup has 105 mcg.  Kale (fresh or frozen) -  cup has 531 mcg.  Parsley (raw) - 10 sprigs has 164 mcg.  Spinach (cooked) -  cup has 444 mcg.  Swiss chard (cooked) -  cup has 287 mcg. Moderate vitamin K foods Foods that have a  moderate amount of vitamin K contain 25-100 mcg per serving. These include:  Asparagus (cooked) - 5 spears have 38 mcg.  Black-eyed peas (dried) -  cup has 32 mcg.  Cabbage (cooked) -  cup has 37 mcg.  Kiwi fruit - 1 medium has 31 mcg.  Lettuce - 1 cup has 57-63 mcg.  Okra (frozen) -  cup has 44 mcg.  Prunes (dried) - 5 prunes have 25 mcg.  Watercress (raw) - 1 cup has 85 mcg. Low vitamin K foods Foods low in vitamin K contain less than 25 mcg per serving. These include:  Artichoke - 1 medium has 18 mcg.  Avocado - 1 oz. has 6 mcg.  Blueberries -  cup has 14 mcg.  Cabbage (raw) -  cup has 21 mcg.  Carrots (cooked) -  cup has 11 mcg.  Cauliflower (raw) -  cup has 11 mcg.  Cucumber with peel (raw) -  cup has 9 mcg.  Grapes -  cup has 12 mcg.  Mango - 1 medium has 9 mcg.  Nuts - 1 oz. has 15 mcg.  Pear - 1 medium has 8 mcg.  Peas (cooked) -  cup has 19 mcg.  Pickles - 1 spear has 14 mcg.  Pumpkin seeds - 1 oz. has 13 mcg.  Sauerkraut (canned) -  cup has 16 mcg.  Soybeans (cooked) -  cup has 16 mcg.    Tomato (raw) - 1 medium has 10 mcg.  Tomato sauce -  cup has 17 mcg. Vitamin K-free foods If a food contain less than 5 mcg per serving, it is considered to have no vitamin K. These foods include:  Bread and cereal products.  Cheese.  Eggs.  Fish and shellfish.  Meat and poultry.  Milk and dairy products.  Sunflower seeds. Actual amounts of vitamin K in foods may be different depending on processing. Talk with your dietitian about what foods you can eat and what foods you should avoid. This information is not intended to replace advice given to you by your health care provider. Make sure you discuss any questions you have with your health care provider. Document Released: 07/29/2009 Document Revised: 09/13/2017 Document Reviewed: 01/04/2016 Elsevier Patient Education  2020 [employer].

## 2019-08-10 NOTE — Patient Instructions (Signed)
Medication Instructions:  Continue current medications  If you need a refill on your cardiac medications before your next appointment, please call your pharmacy*  Lab Work: None Ordered  Testing/Procedures: None Ordered  Follow-Up: At [facility], you and your health needs are our priority.  As part of our continuing mission to provide you with exceptional heart care, we have created designated Provider Care Teams.  These Care Teams include your primary Cardiologist (physician) and Advanced Practice Providers (APPs -  Physician Assistants and Nurse Practitioners) who all work together to provide you with the care you need, when you need it.  Your next appointment:   6 months  The format for your next appointment:   In Person  Provider:   Kirk Ruths, MD

## 2019-08-24 ENCOUNTER — Other Ambulatory Visit: Payer: Self-pay

## 2019-08-24 MED ORDER — DILTIAZEM HCL ER COATED BEADS 120 MG PO CP24: 120.0000 mg | ORAL_CAPSULE | Freq: Every day | ORAL | 1 refills | Status: DC

## 2019-09-07 ENCOUNTER — Ambulatory Visit (INDEPENDENT_AMBULATORY_CARE_PROVIDER_SITE_OTHER): Payer: Medicare Other | Admitting: Pharmacist

## 2019-09-07 ENCOUNTER — Other Ambulatory Visit: Payer: Self-pay

## 2019-09-07 DIAGNOSIS — I480 Paroxysmal atrial fibrillation: Secondary | ICD-10-CM

## 2019-09-07 DIAGNOSIS — Z7901 Long term (current) use of anticoagulants: Secondary | ICD-10-CM | POA: Diagnosis not present

## 2019-09-07 LAB — POCT INR: INR: 2.2 (ref 2.0–3.0)

## 2019-09-22 ENCOUNTER — Encounter: Payer: Self-pay | Admitting: Osteopathic Medicine

## 2019-09-22 ENCOUNTER — Other Ambulatory Visit: Payer: Self-pay | Admitting: *Deleted

## 2019-09-22 ENCOUNTER — Ambulatory Visit (INDEPENDENT_AMBULATORY_CARE_PROVIDER_SITE_OTHER): Payer: Medicare Other | Admitting: Osteopathic Medicine

## 2019-09-22 VITALS — BP 137/67 | HR 97 | Temp 77.0°F | Wt 190.2 lb

## 2019-09-22 DIAGNOSIS — J449 Chronic obstructive pulmonary disease, unspecified: Secondary | ICD-10-CM

## 2019-09-22 MED ORDER — DILTIAZEM HCL ER COATED BEADS 120 MG PO CP24: 120.0000 mg | ORAL_CAPSULE | Freq: Every day | ORAL | 2 refills | Status: DC

## 2019-09-22 MED ORDER — IPRATROPIUM-ALBUTEROL 0.5-2.5 (3) MG/3ML IN SOLN: 3.0000 mL | Freq: Every day | RESPIRATORY_TRACT | 99 refills | Status: DC

## 2019-09-22 NOTE — Telephone Encounter (Signed)
Rx has been sent to the pharmacy electronically. ° °

## 2019-09-22 NOTE — Progress Notes (Signed)
Virtual Visit via Phone  I connected with      Christopher Armstrong on 09/22/19 at 9:53 AM  by a telemedicine application and verified that I am speaking with the correct person using two identifiers.  Patient is at home I am in office   I discussed the limitations of evaluation and management by telemedicine and the availability of in person appointments. The patient expressed understanding and agreed to proceed.  History of Present Illness: Christopher Armstrong is a 78 y.o. male who would like to discuss follow-up COPD   Patient requests refill on duo nebs.  He is using these 5 times per day, instead of 4 times per day.  He has previously declined referral to pulmonology, he is compliant with other medications as noted in list, stable cough/shortness of breath.  Last year we did CT chest for lung cancer screening, nodules were noted, PET scan did not show anything concerning, patient declines repeat scan now.      Observations/Objective: BP 137/67   Pulse 97   Temp (!) 77 F (25 C) (Oral)   Wt 190 lb 3.2 oz (86.3 kg)   BMI 28.09 kg/m  BP Readings from Last 3 Encounters:  09/22/19 137/67  08/10/19 (!) 192/75  07/07/19 (!) 146/62   Exam: Normal Speech.  NAD  Lab and Radiology Results No results found for this or any previous visit (from the past 72 hour(s)). No results found.     Assessment and Plan: 78 y.o. male with The encounter diagnosis was COPD (chronic obstructive pulmonary disease) with chronic bronchitis ([place]).   PDMP not reviewed this encounter. No orders of the defined types were placed in this encounter.  Meds ordered this encounter  Medications  . ipratropium-albuterol (DUONEB) 0.5-2.5 (3) MG/3ML SOLN    Sig: Take 3 mLs by nebulization 5 (five) times daily.    Dispense:  1350 mL    Refill:  99    Fax to [facility] [phone]     Follow Up Instructions: Return for Keep currently scheduled Medicare visit for next year, annual checkup with me sometime  after that.    I discussed the assessment and treatment plan with the patient. The patient was provided an opportunity to ask questions and all were answered. The patient agreed with the plan and demonstrated an understanding of the instructions.   The patient was advised to call back or seek an in-person evaluation if any new concerns, if symptoms worsen or if the condition fails to improve as anticipated.  21 minutes of non-face-to-face time was provided during this encounter.      . . . . . . . . . . . . . Marland Kitchen                   Historical information moved to improve visibility of documentation.  Past Medical History:  Diagnosis Date  . BPH (benign prostatic hyperplasia)   . CHF (congestive heart failure) ([place]) 06/23/2019  . COPD (chronic obstructive pulmonary disease) with chronic bronchitis ([place]) 08/26/2018  . Coronary artery disease of native artery of native heart with stable angina pectoris ([place]) 08/26/2018  . Essential hypertension 08/26/2018  . History of coronary artery stent placement 08/26/2018  . Mixed hyperlipidemia 08/26/2018  . 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 Tobacco use disorder, severe, dependence 08/26/2018   Past Surgical History:  Procedure Laterality Date  . BLADDER TUMOR EXCISION    . CORONARY ANGIOPLASTY WITH STENT PLACEMENT    .  PROSTATE SURGERY    . TONSILLECTOMY     Social History   Tobacco Use  . Smoking status: Current Every Day Smoker    Packs/day: 0.50    Years: 65.00    Pack years: 32.50  . Smokeless tobacco: Never Used  Substance Use Topics  . Alcohol use: Not Currently    Alcohol/week: 0.0 standard drinks    Comment: 0   family history includes Heart failure in his mother; High blood pressure in his mother and sister; Lung cancer in his father.  Medications: Current Outpatient Medications  Medication Sig Dispense Refill  . albuterol (PROVENTIL HFA;VENTOLIN HFA) 108 (90 Base) MCG/ACT inhaler Inhale 1-2  puffs into the lungs every 6 (six) hours as needed for wheezing or shortness of breath. 2 Inhaler 99  . aspirin EC 81 MG tablet Take 1 tablet (81 mg total) by mouth daily. 90 tablet 3  . budesonide-formoterol (SYMBICORT) 160-4.5 MCG/ACT inhaler Inhale 2 puffs into the lungs 2 (two) times daily.    . carvedilol (COREG) 12.5 MG tablet Take 1 tablet (12.5 mg total) by mouth 2 (two) times daily. 180 tablet 3  . diltiazem (CARDIZEM CD) 120 MG 24 hr capsule Take 1 capsule (120 mg total) by mouth daily. 90 capsule 2  . diltiazem (CARDIZEM) 30 MG tablet Take 1 tablet (30 mg total) by mouth as needed. 30 tablet 2  . furosemide (LASIX) 20 MG tablet Take 1 tablet (20 mg total) by mouth daily. 90 tablet 3  . ipratropium-albuterol (DUONEB) 0.5-2.5 (3) MG/3ML SOLN Take 3 mLs by nebulization 5 (five) times daily. 1350 mL 99  . lisinopril (ZESTRIL) 20 MG tablet Take 1 tablet (20 mg total) by mouth daily. 90 tablet 3  . magnesium hydroxide (MILK OF MAGNESIA) 400 MG/5ML suspension Take 30 mLs by mouth as needed.    . nitroGLYCERIN (NITROSTAT) 0.4 MG SL tablet Place 0.4 mg under the tongue every 5 (five) minutes as needed for chest pain.    . OXYGEN Inhale into the lungs at bedtime.    . potassium chloride SA (KLOR-CON M20) 20 MEQ tablet Take 1 tablet (20 mEq total) by mouth daily. 30 tablet 6  . warfarin (COUMADIN) 5 MG tablet Take 1/2 to 1 tablet daily as directed by Coumadin clinic 90 tablet 0   No current facility-administered medications for this visit.    Allergies  Allergen Reactions  . Clopidogrel Other (See Comments)    "Purple blood pockets clots in mouth" Blood blisters in roof of mouth, urinary retention   . Ticagrelor Shortness Of Breath and Other (See Comments)  . Atorvastatin Other (See Comments)  . Simvastatin   . Statins Nausea And Vomiting and Other (See Comments)

## 2019-10-14 ENCOUNTER — Ambulatory Visit (INDEPENDENT_AMBULATORY_CARE_PROVIDER_SITE_OTHER): Payer: Medicare Other | Admitting: Pharmacist

## 2019-10-14 ENCOUNTER — Other Ambulatory Visit: Payer: Self-pay

## 2019-10-14 DIAGNOSIS — I480 Paroxysmal atrial fibrillation: Secondary | ICD-10-CM | POA: Diagnosis not present

## 2019-10-14 DIAGNOSIS — Z7901 Long term (current) use of anticoagulants: Secondary | ICD-10-CM

## 2019-10-14 LAB — POCT INR: INR: 1.6 — AB (ref 2.0–3.0)

## 2019-10-30 ENCOUNTER — Telehealth: Payer: Self-pay

## 2019-10-30 NOTE — Telephone Encounter (Signed)
[name] called and asked if the COVID-19 is recommended for him with his current medications. Please advise.

## 2019-10-30 NOTE — Telephone Encounter (Signed)
Yes he should be able to get it.   Outpatient Encounter Medications as of 10/30/2019  Medication Sig Note   albuterol (PROVENTIL HFA;VENTOLIN HFA) 108 (90 Base) MCG/ACT inhaler Inhale 1-2 puffs into the lungs every 6 (six) hours as needed for wheezing or shortness of breath.    aspirin EC 81 MG tablet Take 1 tablet (81 mg total) by mouth daily.    budesonide-formoterol (SYMBICORT) 160-4.5 MCG/ACT inhaler Inhale 2 puffs into the lungs 2 (two) times daily.    carvedilol (COREG) 12.5 MG tablet Take 1 tablet (12.5 mg total) by mouth 2 (two) times daily.    diltiazem (CARDIZEM CD) 120 MG 24 hr capsule Take 1 capsule (120 mg total) by mouth daily.    diltiazem (CARDIZEM) 30 MG tablet Take 1 tablet (30 mg total) by mouth as needed.    furosemide (LASIX) 20 MG tablet Take 1 tablet (20 mg total) by mouth daily.    ipratropium-albuterol (DUONEB) 0.5-2.5 (3) MG/3ML SOLN Take 3 mLs by nebulization 5 (five) times daily.    lisinopril (ZESTRIL) 20 MG tablet Take 1 tablet (20 mg total) by mouth daily.    magnesium hydroxide (MILK OF MAGNESIA) 400 MG/5ML suspension Take 30 mLs by mouth as needed.    nitroGLYCERIN (NITROSTAT) 0.4 MG SL tablet Place 0.4 mg under the tongue every 5 (five) minutes as needed for chest pain.    OXYGEN Inhale into the lungs at bedtime. 08/26/2018: Pt uses oxygen when napping/sleeping   warfarin (COUMADIN) 5 MG tablet Take 1/2 to 1 tablet daily as directed by Coumadin clinic    No facility-administered encounter medications on file as of 10/30/2019.

## 2019-10-30 NOTE — Telephone Encounter (Signed)
Patient is aware and did not have any questions. He is going to check the local health department as well to see if he can be seen sooner.

## 2019-11-04 ENCOUNTER — Telehealth: Payer: Self-pay | Admitting: Adult Health

## 2019-11-04 ENCOUNTER — Ambulatory Visit (INDEPENDENT_AMBULATORY_CARE_PROVIDER_SITE_OTHER): Payer: Medicare Other | Admitting: Pharmacist Clinician (PhC)/ Clinical Pharmacy Specialist

## 2019-11-04 ENCOUNTER — Other Ambulatory Visit: Payer: Self-pay

## 2019-11-04 ENCOUNTER — Encounter: Payer: Self-pay | Admitting: Pharmacist Clinician (PhC)/ Clinical Pharmacy Specialist

## 2019-11-04 DIAGNOSIS — R739 Hyperglycemia, unspecified: Secondary | ICD-10-CM | POA: Insufficient documentation

## 2019-11-04 DIAGNOSIS — Z7901 Long term (current) use of anticoagulants: Secondary | ICD-10-CM

## 2019-11-04 DIAGNOSIS — I959 Hypotension, unspecified: Secondary | ICD-10-CM | POA: Diagnosis not present

## 2019-11-04 DIAGNOSIS — R079 Chest pain, unspecified: Secondary | ICD-10-CM | POA: Diagnosis not present

## 2019-11-04 DIAGNOSIS — R001 Bradycardia, unspecified: Secondary | ICD-10-CM | POA: Insufficient documentation

## 2019-11-04 DIAGNOSIS — R062 Wheezing: Secondary | ICD-10-CM | POA: Diagnosis not present

## 2019-11-04 DIAGNOSIS — F17210 Nicotine dependence, cigarettes, uncomplicated: Secondary | ICD-10-CM | POA: Diagnosis not present

## 2019-11-04 DIAGNOSIS — R600 Localized edema: Secondary | ICD-10-CM | POA: Insufficient documentation

## 2019-11-04 DIAGNOSIS — E7800 Pure hypercholesterolemia, unspecified: Secondary | ICD-10-CM | POA: Diagnosis not present

## 2019-11-04 DIAGNOSIS — R069 Unspecified abnormalities of breathing: Secondary | ICD-10-CM | POA: Diagnosis not present

## 2019-11-04 DIAGNOSIS — J439 Emphysema, unspecified: Secondary | ICD-10-CM | POA: Diagnosis not present

## 2019-11-04 DIAGNOSIS — I480 Paroxysmal atrial fibrillation: Secondary | ICD-10-CM

## 2019-11-04 DIAGNOSIS — J189 Pneumonia, unspecified organism: Secondary | ICD-10-CM | POA: Diagnosis not present

## 2019-11-04 DIAGNOSIS — Z20822 Contact with and (suspected) exposure to covid-19: Secondary | ICD-10-CM | POA: Diagnosis not present

## 2019-11-04 DIAGNOSIS — I10 Essential (primary) hypertension: Secondary | ICD-10-CM | POA: Diagnosis not present

## 2019-11-04 DIAGNOSIS — R0602 Shortness of breath: Secondary | ICD-10-CM | POA: Diagnosis not present

## 2019-11-04 LAB — POCT INR: INR: 1.7 — AB (ref 2.0–3.0)

## 2019-11-04 NOTE — Telephone Encounter (Signed)
Received a call from patient.Stated he is very sob.Stated hard to walk from room to room.Stated he is also having tightness in chest.B/P at present 187/59.Pulse 40.Stated he feels awful.Advised to go to ED.Stated he lives in [place].He will call 911 and go to [facility] ED.

## 2019-11-04 NOTE — Telephone Encounter (Signed)
New Message   Patient is calling because he has been having difficultes with his HR. He states that it will go from 38 then up to the 70's but back down to the 38 or 40 within the hour. He also states that he has been having a harding time breathing. He was in earlier today about his coumadin and he mentioned it to them and they advised him to call back because they could not get a hold of anyone.

## 2019-11-05 DIAGNOSIS — Z9079 Acquired absence of other genital organ(s): Secondary | ICD-10-CM | POA: Diagnosis not present

## 2019-11-05 DIAGNOSIS — I10 Essential (primary) hypertension: Secondary | ICD-10-CM | POA: Diagnosis present

## 2019-11-05 DIAGNOSIS — J189 Pneumonia, unspecified organism: Secondary | ICD-10-CM | POA: Diagnosis not present

## 2019-11-05 DIAGNOSIS — Z955 Presence of coronary angioplasty implant and graft: Secondary | ICD-10-CM | POA: Diagnosis not present

## 2019-11-05 DIAGNOSIS — Z79899 Other long term (current) drug therapy: Secondary | ICD-10-CM | POA: Diagnosis not present

## 2019-11-05 DIAGNOSIS — I252 Old myocardial infarction: Secondary | ICD-10-CM | POA: Diagnosis not present

## 2019-11-05 DIAGNOSIS — E7800 Pure hypercholesterolemia, unspecified: Secondary | ICD-10-CM | POA: Diagnosis present

## 2019-11-05 DIAGNOSIS — Z7982 Long term (current) use of aspirin: Secondary | ICD-10-CM | POA: Diagnosis not present

## 2019-11-05 DIAGNOSIS — R0602 Shortness of breath: Secondary | ICD-10-CM | POA: Insufficient documentation

## 2019-11-05 DIAGNOSIS — R600 Localized edema: Secondary | ICD-10-CM | POA: Diagnosis present

## 2019-11-05 DIAGNOSIS — R2243 Localized swelling, mass and lump, lower limb, bilateral: Secondary | ICD-10-CM | POA: Diagnosis not present

## 2019-11-05 DIAGNOSIS — R001 Bradycardia, unspecified: Secondary | ICD-10-CM | POA: Diagnosis present

## 2019-11-05 DIAGNOSIS — Z20822 Contact with and (suspected) exposure to covid-19: Secondary | ICD-10-CM | POA: Diagnosis present

## 2019-11-05 DIAGNOSIS — I2510 Atherosclerotic heart disease of native coronary artery without angina pectoris: Secondary | ICD-10-CM | POA: Diagnosis present

## 2019-11-05 DIAGNOSIS — Z7951 Long term (current) use of inhaled steroids: Secondary | ICD-10-CM | POA: Diagnosis not present

## 2019-11-05 DIAGNOSIS — Z9981 Dependence on supplemental oxygen: Secondary | ICD-10-CM | POA: Diagnosis not present

## 2019-11-05 DIAGNOSIS — Z66 Do not resuscitate: Secondary | ICD-10-CM | POA: Diagnosis present

## 2019-11-05 DIAGNOSIS — R739 Hyperglycemia, unspecified: Secondary | ICD-10-CM | POA: Diagnosis present

## 2019-11-05 DIAGNOSIS — I493 Ventricular premature depolarization: Secondary | ICD-10-CM | POA: Diagnosis present

## 2019-11-05 DIAGNOSIS — Z7901 Long term (current) use of anticoagulants: Secondary | ICD-10-CM | POA: Diagnosis not present

## 2019-11-05 DIAGNOSIS — I480 Paroxysmal atrial fibrillation: Secondary | ICD-10-CM | POA: Diagnosis present

## 2019-11-05 DIAGNOSIS — R918 Other nonspecific abnormal finding of lung field: Secondary | ICD-10-CM | POA: Diagnosis present

## 2019-11-05 DIAGNOSIS — Z888 Allergy status to other drugs, medicaments and biological substances status: Secondary | ICD-10-CM | POA: Diagnosis not present

## 2019-11-05 DIAGNOSIS — F17210 Nicotine dependence, cigarettes, uncomplicated: Secondary | ICD-10-CM | POA: Diagnosis present

## 2019-11-05 DIAGNOSIS — J440 Chronic obstructive pulmonary disease with acute lower respiratory infection: Secondary | ICD-10-CM | POA: Diagnosis present

## 2019-11-10 ENCOUNTER — Telehealth: Payer: Self-pay | Admitting: Pharmacist Clinician (PhC)/ Clinical Pharmacy Specialist

## 2019-11-10 NOTE — Telephone Encounter (Signed)
Pt was hospitalized x 5 days for CAP, given IV abx.  Was discharged home yesterday with levaquin 750 mg x 1.  Advised he take the tablet.  No changes to warfarin.

## 2019-11-25 ENCOUNTER — Other Ambulatory Visit: Payer: Self-pay

## 2019-11-25 ENCOUNTER — Ambulatory Visit (INDEPENDENT_AMBULATORY_CARE_PROVIDER_SITE_OTHER): Payer: Medicare Other | Admitting: Pharmacist Clinician (PhC)/ Clinical Pharmacy Specialist

## 2019-11-25 DIAGNOSIS — I480 Paroxysmal atrial fibrillation: Secondary | ICD-10-CM | POA: Diagnosis not present

## 2019-11-25 DIAGNOSIS — Z7901 Long term (current) use of anticoagulants: Secondary | ICD-10-CM

## 2019-11-25 LAB — POCT INR: INR: 2.7 (ref 2.0–3.0)

## 2019-11-27 ENCOUNTER — Telehealth: Payer: Self-pay

## 2019-11-27 NOTE — Telephone Encounter (Signed)
Christopher Armstrong would like to have his INR checked with our office instead of the [facility] in [place]. Please advise.

## 2019-11-28 ENCOUNTER — Other Ambulatory Visit: Payer: Self-pay | Admitting: Osteopathic Medicine

## 2019-11-30 NOTE — Telephone Encounter (Signed)
Patient advised and scheduled.  

## 2019-11-30 NOTE — Telephone Encounter (Signed)
OK can schedule nurse visit for this

## 2019-12-09 ENCOUNTER — Ambulatory Visit: Payer: Self-pay | Admitting: Pharmacist Clinician (PhC)/ Clinical Pharmacy Specialist

## 2019-12-09 DIAGNOSIS — I480 Paroxysmal atrial fibrillation: Secondary | ICD-10-CM

## 2019-12-09 DIAGNOSIS — Z7901 Long term (current) use of anticoagulants: Secondary | ICD-10-CM

## 2019-12-15 ENCOUNTER — Telehealth: Payer: Self-pay | Admitting: Osteopathic Medicine

## 2019-12-15 ENCOUNTER — Other Ambulatory Visit: Payer: Self-pay | Admitting: Osteopathic Medicine

## 2019-12-15 MED ORDER — ALBUTEROL SULFATE HFA 108 (90 BASE) MCG/ACT IN AERS: 1.0000 | INHALATION_SPRAY | Freq: Four times a day (QID) | RESPIRATORY_TRACT | 99 refills | Status: DC | PRN

## 2019-12-15 NOTE — Telephone Encounter (Signed)
Left a detailed vm msg for pt regarding paperwork package. Aware that forms are available for p/u at front desk today. Direct call back info provided.

## 2019-12-15 NOTE — Telephone Encounter (Signed)
Finished his paperwork, placed in Vanicia's basket to take up front and call pt when ready, thanks!

## 2019-12-23 ENCOUNTER — Ambulatory Visit: Payer: Medicare Other

## 2019-12-25 ENCOUNTER — Ambulatory Visit (INDEPENDENT_AMBULATORY_CARE_PROVIDER_SITE_OTHER): Payer: Medicare Other | Admitting: Osteopathic Medicine

## 2019-12-25 ENCOUNTER — Other Ambulatory Visit: Payer: Self-pay

## 2019-12-25 DIAGNOSIS — I480 Paroxysmal atrial fibrillation: Secondary | ICD-10-CM

## 2019-12-25 LAB — POCT INR: INR: 2.2 (ref 2.0–3.0)

## 2019-12-25 NOTE — Progress Notes (Signed)
   Subjective:    Patient ID: Christopher Armstrong, male    DOB: 1940-11-21, 79 y.o.   MRN: [id number]  HPI Patient here for INR check. No complaints of bleeding gums, nose bleeds, blood in stool, bruising easily. KG LPN   Review of Systems     Objective:   Physical Exam        Assessment & Plan:  Return in 1 month for recheck of INR

## 2020-01-25 ENCOUNTER — Encounter: Payer: Self-pay | Admitting: Osteopathic Medicine

## 2020-01-25 ENCOUNTER — Other Ambulatory Visit: Payer: Self-pay

## 2020-01-25 ENCOUNTER — Ambulatory Visit (INDEPENDENT_AMBULATORY_CARE_PROVIDER_SITE_OTHER): Payer: Medicare Other | Admitting: Osteopathic Medicine

## 2020-01-25 DIAGNOSIS — I480 Paroxysmal atrial fibrillation: Secondary | ICD-10-CM

## 2020-01-25 LAB — POCT INR: INR: 2 (ref 2.0–3.0)

## 2020-01-25 NOTE — Progress Notes (Signed)
  BP Readings from Last 3 Encounters:  01/25/20 (!) 182/78  12/25/19 (!) 188/67  09/22/19 137/67   Increase lisinopril from 20 to 40  Recheck in a week  Bring home BP monitor

## 2020-02-08 ENCOUNTER — Other Ambulatory Visit: Payer: Self-pay

## 2020-02-08 ENCOUNTER — Encounter: Payer: Self-pay | Admitting: Osteopathic Medicine

## 2020-02-08 ENCOUNTER — Ambulatory Visit (INDEPENDENT_AMBULATORY_CARE_PROVIDER_SITE_OTHER): Payer: Medicare Other | Admitting: Osteopathic Medicine

## 2020-02-08 VITALS — BP 215/78 | HR 74 | Temp 97.4°F | Wt 199.1 lb

## 2020-02-08 DIAGNOSIS — I480 Paroxysmal atrial fibrillation: Secondary | ICD-10-CM

## 2020-02-08 DIAGNOSIS — I10 Essential (primary) hypertension: Secondary | ICD-10-CM

## 2020-02-08 DIAGNOSIS — J449 Chronic obstructive pulmonary disease, unspecified: Secondary | ICD-10-CM

## 2020-02-08 MED ORDER — FUROSEMIDE 40 MG PO TABS: 40.0000 mg | ORAL_TABLET | Freq: Every day | ORAL | 0 refills | Status: DC

## 2020-02-08 MED ORDER — LISINOPRIL 20 MG PO TABS: 40.0000 mg | ORAL_TABLET | Freq: Every day | ORAL | 3 refills | Status: DC

## 2020-02-08 NOTE — Progress Notes (Signed)
Virtual Visit via Telephone Note   This visit type was conducted due to national recommendations for restrictions regarding the COVID-19 Pandemic (e.g. social distancing) in an effort to limit this patient's exposure and mitigate transmission in our community.  Due to his co-morbid illnesses, this patient is at least at moderate risk for complications without adequate follow up.  This format is felt to be most appropriate for this patient at this time.  The patient did not have access to video technology/had technical difficulties with video requiring transitioning to audio format only (telephone).  All issues noted in this document were discussed and addressed.  No physical exam could be performed with this format.  Please refer to the patient's chart for his  consent to telehealth for [facility].   Date:  02/09/2020   ID:  Christopher Armstrong, DOB 07-07-1941, MRN [id number]  Patient Location: Home Provider Location: Home  PCP:  [name], DO  Cardiologist: Dr.[place]  Electrophysiologist:  None   Evaluation Performed:  Follow-Up Visit  Chief Complaint:  Follow up  History of Present Illness:    Christopher Armstrong is a 79 y.o. male  male who presents for ongoing assessment and management of CAD.  History of acute inferior MI [id number] which was complicated by complete heart block requiring temporary pacemaker.  The patient has PCI of his RCA with 3 stents.  Other history includes COPD on oxygen, BPH, diastolic CHF.  And recently diagnosed with paroxysmal atrial fibrillation during recent hospitalization to [facility] on 06/16/2019.  The patient is on Coumadin therapy for anticoagulation.  Coumadin is followed by our Coumadin clinic at [facility] office  Follow-up chest CT in November 2019 showed multiple pulmonary nodules suggestive of inflammatory infectious process but malignancy could not be excluded. A PET CT was recommended.  He had a second admission to [facility] in the setting of decompensated heart failure, he was diuresed and diltiazem was decreased from 180 mg to 120 mg daily.  Dry weight 184 pounds.  Repeat echocardiogram revealed an EF of 55 to 65%.  He was not have any issues with bleeding on Coumadin.  He was concerned about going in and out of atrial fibrillation although he was uncertain when these episodes occurred.    He was last seen in the office on 08/10/2019 at which time he was in a regular rhythm. He was continued on coumadin and diltiazem.He is to be seen by pulmonology for management of COPD. No planned testing or medication changes were made.  He complaints today of elevated BP and edema.Was seen by his PCP who increased his lisinopril to 40 mg daily from 20 mg daily and increased his lasix to 40 mg daily from 20 mg daily. He has not noticed any difference yet, but just started the regimen today.  He states that his breathing status is worsened with the COPD.  The patient \\does  not have symptoms concerning for COVID-19 infection (fever, chills, cough, or new shortness of breath).    Past Medical History:  Diagnosis Date  . BPH (benign prostatic hyperplasia)   . CHF (congestive heart failure) ([place]) 06/23/2019  . COPD (chronic obstructive pulmonary disease) with chronic bronchitis ([place]) 08/26/2018  . Coronary artery disease of native artery of native heart with stable angina pectoris ([place]) 08/26/2018  . Essential hypertension 08/26/2018  . History of coronary artery stent placement 08/26/2018  . Mixed hyperlipidemia 08/26/2018  . 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 Tobacco use disorder, severe, dependence  08/26/2018   Past Surgical History:  Procedure Laterality Date  . BLADDER TUMOR EXCISION    . CORONARY ANGIOPLASTY WITH STENT PLACEMENT    . PROSTATE SURGERY    . TONSILLECTOMY       Current Meds  Medication Sig  . albuterol (VENTOLIN HFA) 108 (90 Base) MCG/ACT inhaler Inhale 1-2 puffs into the lungs every 6 (six) hours  as needed for wheezing or shortness of breath.  Marland Kitchen aspirin EC 81 MG tablet Take 1 tablet (81 mg total) by mouth daily.  . budesonide-formoterol (SYMBICORT) 160-4.5 MCG/ACT inhaler Inhale 2 puffs into the lungs 2 (two) times daily.  . carvedilol (COREG) 12.5 MG tablet Take 1 tablet by mouth twice daily  . diltiazem (CARDIZEM) 30 MG tablet Take 1 tablet (30 mg total) by mouth as needed.  . furosemide (LASIX) 40 MG tablet Take 1 tablet (40 mg total) by mouth daily.  Marland Kitchen ipratropium-albuterol (DUONEB) 0.5-2.5 (3) MG/3ML SOLN Take 3 mLs by nebulization 5 (five) times daily.  Marland Kitchen lisinopril (ZESTRIL) 20 MG tablet Take 2 tablets (40 mg total) by mouth daily.  . magnesium hydroxide (MILK OF MAGNESIA) 400 MG/5ML suspension Take 30 mLs by mouth as needed.  . nitroGLYCERIN (NITROSTAT) 0.4 MG SL tablet Place 0.4 mg under the tongue every 5 (five) minutes as needed for chest pain.  . OXYGEN Inhale into the lungs at bedtime.  Marland Kitchen warfarin (COUMADIN) 5 MG tablet Take 1/2 to 1 tablet daily as directed by Coumadin clinic     Allergies:   Clopidogrel, Ticagrelor, Atorvastatin, Simvastatin, and Statins   Social History   Tobacco Use  . Smoking status: Current Every Day Smoker    Packs/day: 0.50    Years: 65.00    Pack years: 32.50  . Smokeless tobacco: Never Used  Substance Use Topics  . Alcohol use: Not Currently    Alcohol/week: 0.0 standard drinks    Comment: 0  . Drug use: Never     Family Hx: The patient's family history includes Heart failure in his mother; High blood pressure in his mother and sister; Lung cancer in his father.  ROS:   Please see the history of present illness.    All other systems reviewed and are negative.   Prior CV studies:   The following studies were reviewed today:  See notes above echo from [facility]/Other Tests and Data Reviewed:    EKG:  No ECG reviewed.  Recent Labs: 06/23/2019: ALT 18; Hemoglobin 14.5; Platelets 137 06/27/2019: Magnesium 2.3 07/14/2019: BUN  13; Creatinine, Ser 1.14; Potassium 4.5; Sodium 141   Recent Lipid Panel No results found for: CHOL, TRIG, HDL, CHOLHDL, LDLCALC, LDLDIRECT  Wt Readings from Last 3 Encounters:  02/09/20 194 lb (88 kg)  02/08/20 199 lb 1.3 oz (90.3 kg)  12/25/19 196 lb (88.9 kg)     Objective:    Vital Signs:  BP (!) 188/79   Pulse 66   Ht 5\' 9"  (1.753 m)   Wt 194 lb (88 kg)   BMI 28.65 kg/m    VITAL SIGNS:  reviewed GEN:  no acute distress RESPIRATORY:  normal respiratory effort, symmetric expansion NEURO:  alert and oriented x 3, no obvious focal deficit PSYCH:  normal affect  ASSESSMENT & PLAN:    1. Hypertension: Recently increased lisinopril to 40 mg and lasix to 40 mg by PCP 24 hours ago.  He has not noticed a difference yet in how he is feeling. BP and edema remain elevated. I will increase his  coreg to 18.375 BID (1 1.5 tablets) BID. I will see him on close follow up in 2 weeks. He is to take his BP daily and record. He will need BMET on visit to check kidney function.  2. Chronic Diastolic CHF: His weight is up 10 lbs from desired dry weight per Dr. Blenda Mounts note.  We will see him in 2 weeks to ascertain his response to change in medication regimen.  3. COPD: Contributing to overall decreased breathing status. He does have pulmonary nodules per CT in 2019. This will need to be followed.   COVID-19 Education: The signs and symptoms of COVID-19 were discussed with the patient and how to seek care for testing (follow up with PCP or arrange E-visit). The importance of social distancing was discussed today.  Time:   Today, I have spent 25 minutes with the patient with telehealth technology discussing the above problems.     Medication Adjustments/Labs and Tests Ordered: Current medicines are reviewed at length with the patient today.  Concerns regarding medicines are outlined above.   Tests Ordered: No orders of the defined types were placed in this encounter.   Medication  Changes: No orders of the defined types were placed in this encounter.   Disposition:  Follow up  2 weeks   Signed, Phill Myron. West Pugh, ANP, AACC  02/09/2020 11:01 AM    [place]

## 2020-02-08 NOTE — Progress Notes (Signed)
Christopher Armstrong is a 79 y.o. male who presents to  [place] at [facility]  today, 02/08/20, seeking care for the following: . BP follow-up     ASSESSMENT & PLAN with other pertinent history/findings:  The primary encounter diagnosis was White coat syndrome with hypertension. Diagnoses of COPD (chronic obstructive pulmonary disease) with chronic bronchitis (HCC) and Paroxysmal atrial fibrillation ([place]) were also pertinent to this visit.  BP Readings from Last 3 Encounters:  02/08/20 (!) 215/78  01/25/20 (!) 182/78  12/25/19 (!) 188/67   BP elevated Home numbers better but not by much  Systolic at home usually [id number] Notes more LE edema but no increased SOB  +1 edema to mid-shin on exam, no rales, no JVD S1S2 WNL, rhythm sounds to be in sinus right now   Increase Lasix He has cardiology appt tomorrow  Not taking cardizem 120 mg Taking cardizem 30 mg prn palpitations  We increased lisinopril to 40 mg, no change in BP  Would hesitate to increase coreg d/t hx dizziness on higher doses I'd be ok to go back on cardizem if cardiology is ok with that plan - note routed  Labs in 1 week on increased Lasix and lisinopril - reminder set    Patient Instructions  Let's increase the Lasix from 20 mg daily to 40 mg daily Let's see what cardiology has to say about the blood pressure medication - I think we need some adjustment. I'll reach out to them so they know we've discussed it.      No orders of the defined types were placed in this encounter.   Meds ordered this encounter  Medications  . furosemide (LASIX) 40 MG tablet    Sig: Take 1 tablet (40 mg total) by mouth daily.    Dispense:  90 tablet    Refill:  0       Follow-up instructions: Return for RECHECK DEPENDING ON CARDIOLOGY VISIT / BP, OTHERWISE WHEN DUE FOR INR CHECK .                                         BP (!) 215/78 (BP  Location: Left Arm, Patient Position: Sitting, Cuff Size: Normal)   Pulse 74   Temp (!) 97.4 F (36.3 C) (Oral)   Wt 199 lb 1.3 oz (90.3 kg)   SpO2 98%   BMI 29.40 kg/m   Current Meds  Medication Sig  . albuterol (VENTOLIN HFA) 108 (90 Base) MCG/ACT inhaler Inhale 1-2 puffs into the lungs every 6 (six) hours as needed for wheezing or shortness of breath.  Marland Kitchen aspirin EC 81 MG tablet Take 1 tablet (81 mg total) by mouth daily.  . budesonide-formoterol (SYMBICORT) 160-4.5 MCG/ACT inhaler Inhale 2 puffs into the lungs 2 (two) times daily.  . carvedilol (COREG) 12.5 MG tablet Take 1 tablet by mouth twice daily  . diltiazem (CARDIZEM) 30 MG tablet Take 1 tablet (30 mg total) by mouth as needed.  Marland Kitchen lisinopril (ZESTRIL) 20 MG tablet Take 1 tablet (20 mg total) by mouth daily.  . magnesium hydroxide (MILK OF MAGNESIA) 400 MG/5ML suspension Take 30 mLs by mouth as needed.  . nitroGLYCERIN (NITROSTAT) 0.4 MG SL tablet Place 0.4 mg under the tongue every 5 (five) minutes as needed for chest pain.  . OXYGEN Inhale into the lungs at bedtime.  Marland Kitchen warfarin (COUMADIN) 5  MG tablet Take 1/2 to 1 tablet daily as directed by Coumadin clinic    No results found for this or any previous visit (from the past 72 hour(s)).  No results found.  Depression screen [facility] 2/9 06/23/2019 05/25/2019 08/26/2018  Decreased Interest 1 0 0  Down, Depressed, Hopeless 0 0 0  PHQ - 2 Score 1 0 0  Altered sleeping 1 - 0  Tired, decreased energy 1 - 1  Change in appetite 0 - 0  Feeling bad or failure about yourself  0 - 0  Trouble concentrating 0 - 0  Moving slowly or fidgety/restless 0 - 0  Suicidal thoughts 0 - 0  PHQ-9 Score 3 - 1  Difficult doing work/chores Not difficult at all - Not difficult at all    GAD 7 : Generalized Anxiety Score 06/23/2019 08/26/2018  Nervous, Anxious, on Edge 1 0  Control/stop worrying 0 0  Worry too much - different things 0 0  Trouble relaxing 0 0  Restless 0 0  Easily annoyed or  irritable 1 0  Afraid - awful might happen 0 0  Total GAD 7 Score 2 0  Anxiety Difficulty - Not difficult at all      All questions at time of visit were answered - patient instructed to contact office with any additional concerns or updates.  ER/RTC precautions were reviewed with the patient.  Please note: voice recognition software was used to produce this document, and typos may escape review. Please contact Dr. Sheppard Coil for any needed clarifications.   Total encounter time: 30 minutes.

## 2020-02-08 NOTE — Patient Instructions (Signed)
Let's increase the Lasix from 20 mg daily to 40 mg daily Let's see what cardiology has to say about the blood pressure medication - I think we need some adjustment. I'll reach out to them so they know we've discussed it.

## 2020-02-09 ENCOUNTER — Encounter: Payer: Self-pay | Admitting: Adult Health

## 2020-02-09 ENCOUNTER — Telehealth (INDEPENDENT_AMBULATORY_CARE_PROVIDER_SITE_OTHER): Payer: Medicare Other | Admitting: Adult Health

## 2020-02-09 VITALS — BP 188/79 | HR 66 | Ht 69.0 in | Wt 194.0 lb

## 2020-02-09 DIAGNOSIS — I10 Essential (primary) hypertension: Secondary | ICD-10-CM | POA: Diagnosis not present

## 2020-02-09 DIAGNOSIS — J449 Chronic obstructive pulmonary disease, unspecified: Secondary | ICD-10-CM | POA: Diagnosis not present

## 2020-02-09 DIAGNOSIS — I5032 Chronic diastolic (congestive) heart failure: Secondary | ICD-10-CM | POA: Diagnosis not present

## 2020-02-09 MED ORDER — CARVEDILOL 12.5 MG PO TABS: 18.7500 mg | ORAL_TABLET | Freq: Two times a day (BID) | ORAL | 3 refills | Status: DC

## 2020-02-09 NOTE — Addendum Note (Signed)
Addended by: Vennie Homans on: 02/09/2020 11:16 AM   Modules accepted: Orders

## 2020-02-09 NOTE — Patient Instructions (Signed)
Medication Instructions:  INCREASE- Carvedilol 18.75 mg(1 1/2 tablets) by mouth twice a day  *If you need a refill on your cardiac medications before your next appointment, please call your pharmacy*   Lab Work: None Ordered   Testing/Procedures: None Ordered   Follow-Up: At [facility], you and your health needs are our priority.  As part of our continuing mission to provide you with exceptional heart care, we have created designated Provider Care Teams.  These Care Teams include your primary Cardiologist (physician) and Advanced Practice Providers (APPs -  Physician Assistants and Nurse Practitioners) who all work together to provide you with the care you need, when you need it.  We recommend signing up for the patient portal called "MyChart".  Sign up information is provided on this After Visit Summary.  MyChart is used to connect with patients for Virtual Visits (Telemedicine).  Patients are able to view lab/test results, encounter notes, upcoming appointments, etc.  Non-urgent messages can be sent to your provider as well.   To learn more about what you can do with MyChart, go to [email or web address].    Your next appointment:   Wednesday May 19th @ 10:45 am  The format for your next appointment:   In Person  Provider:   Jory Sims, DNP, ANP

## 2020-02-15 ENCOUNTER — Telehealth: Payer: Self-pay | Admitting: Osteopathic Medicine

## 2020-02-15 DIAGNOSIS — I10 Essential (primary) hypertension: Secondary | ICD-10-CM

## 2020-02-15 NOTE — Telephone Encounter (Addendum)
Error - cardiology notes reviewed, they will be monitoring BMP

## 2020-02-24 ENCOUNTER — Ambulatory Visit (INDEPENDENT_AMBULATORY_CARE_PROVIDER_SITE_OTHER): Payer: Medicare Other | Admitting: Osteopathic Medicine

## 2020-02-24 ENCOUNTER — Other Ambulatory Visit: Payer: Self-pay

## 2020-02-24 VITALS — BP 173/48 | HR 72

## 2020-02-24 DIAGNOSIS — Z7901 Long term (current) use of anticoagulants: Secondary | ICD-10-CM | POA: Diagnosis not present

## 2020-02-24 DIAGNOSIS — Z5181 Encounter for therapeutic drug level monitoring: Secondary | ICD-10-CM

## 2020-02-24 DIAGNOSIS — I480 Paroxysmal atrial fibrillation: Secondary | ICD-10-CM | POA: Diagnosis not present

## 2020-02-24 LAB — POCT INR: INR: 2.7 (ref 2.0–3.0)

## 2020-02-24 NOTE — Progress Notes (Signed)
Patient reports taking Coumadin as follows:  Monday and Friday 2.5 mg All other days 5 mg   Christopher Armstrong denies any missed doses of medication, extra doses, bleeding gums, nose bleeds, antibiotic use, hospitalization, blood in urine, blood in stool, dental procedures, any bruising, abnormal bleeding, or medication changes.  INR was checked today with a reading of 2.7. Patient instructed to continue current dosage and follow up for repeat INR check in 4 weeks. He is to call with any problems prior.   Patient's blood pressure today is 173/48. He states after blood pressure adjustment his home readings are as follows:   02/22/2020: 147/60 HR 85 02/23/2020: 132/57 HR 70 02/24/2020: 136/61 HR 60  He does state blood pressure always elevated in the office.

## 2020-02-29 ENCOUNTER — Telehealth: Payer: Self-pay

## 2020-02-29 ENCOUNTER — Other Ambulatory Visit: Payer: Self-pay | Admitting: Cardiology

## 2020-02-29 NOTE — Telephone Encounter (Signed)
[name] states [employer] will be sending a refill request for a patient assistance for Symbicort.

## 2020-02-29 NOTE — Progress Notes (Signed)
Cardiology Office Note   Date:  03/02/2020   ID:  MIZRAIM GANSON, DOB [birth year], MRN [id number]  PCP:  [name], DO  Cardiologist: Dr Stanford Breed  CC; Follow up   History of Present Illness: Christopher Armstrong is a 79 y.o. male who presents for ongoing assessment and management of coronary artery disease with history of acute inferior MI June 0000000 which was complicated by complete heart block requiring temporary pacemaker.  The patient subsequently had PCI of his RCA with 3 stents.  Other history includes diastolic CHF, paroxysmal atrial fibrillation which was diagnosed at [facility].  He is on Coumadin therapy and followed by the Coumadin clinic at the [facility] office.  The patient had a follow-up chest CT in November 2019 that showed multiple pulmonary nodules suggestive of inflammatory infectious process but malignancy could not be excluded.  When seen last in the office on 02/09/2020 he complained of elevated BP with edema.  He had been seen by his PCP who increased his lisinopril to 40 mg from 20 mg daily and increase his Lasix to 40 mg daily.  At that time the patient did not notice any difference in his blood pressure or fluid retention.  He also complained of worsening breathing status with his COPD.  At that office visit I did not increase his diuretic any further, however I did increase his carvedilol to 8.375 mg twice daily.  He is here for close follow-up to evaluate his fluid retention, blood pressure control, and will have a BM ET completed.  He was noted to have weight increase of 10 pounds.  He was to be followed by pulmonary concerning his abnormal CT scan completed 18 months ago.  He comes today with worsening breathing issues.  Blood pressure is elevated.  He states his blood pressure goes up when he comes to medical offices.  He has been medically compliant.  He states that it takes him a couple hours to clear his lungs each day.  A lot of coughing and  breathing treatments.  He states the weather tends to exacerbate it.  He had been referred to pulmonology but has not followed up with them as the pandemic did not allow for him to make extra appointments.  He is on a limited income and has to watch his funds and tries to avoid extra office visits if possible.   Past Medical History:  Diagnosis Date  . BPH (benign prostatic hyperplasia)   . CHF (congestive heart failure) ([place]) 06/23/2019  . COPD (chronic obstructive pulmonary disease) with chronic bronchitis ([place]) 08/26/2018  . Coronary artery disease of native artery of native heart with stable angina pectoris ([place]) 08/26/2018  . Essential hypertension 08/26/2018  . History of coronary artery stent placement 08/26/2018  . Mixed hyperlipidemia 08/26/2018  . 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 Tobacco use disorder, severe, dependence 08/26/2018    Past Surgical History:  Procedure Laterality Date  . BLADDER TUMOR EXCISION    . CORONARY ANGIOPLASTY WITH STENT PLACEMENT    . PROSTATE SURGERY    . TONSILLECTOMY       Current Outpatient Medications  Medication Sig Dispense Refill  . albuterol (VENTOLIN HFA) 108 (90 Base) MCG/ACT inhaler Inhale 1-2 puffs into the lungs every 6 (six) hours as needed for wheezing or shortness of breath. 18 g 99  . aspirin EC 81 MG tablet Take 1 tablet (81 mg total) by mouth daily. 90 tablet 3  . budesonide-formoterol (SYMBICORT) 160-4.5 MCG/ACT inhaler  Inhale 2 puffs into the lungs 2 (two) times daily.    . carvedilol (COREG) 12.5 MG tablet Take 1.5 tablets (18.75 mg total) by mouth 2 (two) times daily. 270 tablet 3  . diltiazem (CARDIZEM) 30 MG tablet Take 1 tablet (30 mg total) by mouth as needed. 30 tablet 2  . furosemide (LASIX) 40 MG tablet Take 1 tablet (40 mg total) by mouth daily. 90 tablet 0  . lisinopril (ZESTRIL) 20 MG tablet Take 2 tablets (40 mg total) by mouth daily. 90 tablet 3  . magnesium hydroxide (MILK OF MAGNESIA) 400 MG/5ML suspension Take 30  mLs by mouth as needed.    . nitroGLYCERIN (NITROSTAT) 0.4 MG SL tablet Place 0.4 mg under the tongue every 5 (five) minutes as needed for chest pain.    . OXYGEN Inhale into the lungs at bedtime.    Marland Kitchen warfarin (COUMADIN) 5 MG tablet TAKE 1/2 TO 1 (ONE-HALF TO ONE) TABLET BY MOUTH AS DIRECTED BY  COUMADIN  CLINIC 90 tablet 0  . ipratropium-albuterol (DUONEB) 0.5-2.5 (3) MG/3ML SOLN Take 3 mLs by nebulization 5 (five) times daily. 1350 mL 99   No current facility-administered medications for this visit.    Allergies:   Clopidogrel, Ticagrelor, Atorvastatin, Simvastatin, and Statins    Social History:  The patient  reports that he has been smoking. He has a 32.50 pack-year smoking history. He has never used smokeless tobacco. He reports previous alcohol use. He reports that he does not use drugs.   Family History:  The patient's family history includes Heart failure in his mother; High blood pressure in his mother and sister; Lung cancer in his father.    ROS: All other systems are reviewed and negative. Unless otherwise mentioned in H&P    PHYSICAL EXAM: VS:  BP (!) 180/86   Pulse 81   Temp (!) 97 F (36.1 C)   Ht 5\' 9"  (1.753 m)   Wt 196 lb 6.4 oz (89.1 kg)   SpO2 97%   BMI 29.00 kg/m  , BMI Body mass index is 29 kg/m. GEN: Well nourished, well developed, in no acute distress HEENT: normal Neck: no JVD, carotid bruits, or masses Cardiac: RRR; no murmurs, rubs, or gallops, mild dependent edema  Respiratory:  Clear to auscultation bilaterally, normal work of breathing GI: soft, nontender, nondistended, + BS MS: no deformity or atrophy Skin: warm and dry, no rash Neuro:  Strength and sensation are intact Psych: euthymic mood, full affect   EKG: Not completed this office visit  Recent Labs: 06/23/2019: ALT 18; Hemoglobin 14.5; Platelets 137 06/27/2019: Magnesium 2.3 07/14/2019: BUN 13; Creatinine, Ser 1.14; Potassium 4.5; Sodium 141    Lipid Panel No results found for:  CHOL, TRIG, HDL, CHOLHDL, VLDL, LDLCALC, LDLDIRECT    Wt Readings from Last 3 Encounters:  03/02/20 196 lb 6.4 oz (89.1 kg)  02/09/20 194 lb (88 kg)  02/08/20 199 lb 1.3 oz (90.3 kg)      ASSESSMENT AND PLAN:  1.  Coronary artery disease: Has had PCI of his RCA with 3 stents which was completed in [place] in 2017 prior to moving here.  This was in the setting of an acute inferior MI.  He denies any recurrent chest pain.  His main issues are with breathing and coughing.  Since it has been 5 years and his breathing status is worsened, I did discuss with him possibly repeating an ischemic work-up as he has a history of CAD and continues to smoke.  He has refused any further cardiac testing.  "I have lived a good life I do not want any more tests, I do not want any more stents."  He wishes to be treated medically only.  2.  COPD: He is not been seen by pulmonology on referral.  He states he will give them a call.  During the pandemic he tried to limit the amount of doctors office visits and being out among others.  Since his breathing status has worsened he may need further testing and/or medication adjustment/management to assist him.  Will defer to pulmonology.  He states he believes he does have lung cancer due to the abnormal CT scan showing multiple lung nodules.  He does not wish treatment for this.  3.  Paroxysmal atrial fibrillation: Heart rate is well controlled today.  He is followed in our Coumadin clinic.  Offers no complaints of bleeding or bruising.  4.  Hypertension: Elevated initially.  I did recheck it after speaking with him for several minutes while he was seated.  It decreased from 180/86 down to 168/70.  He states his blood pressure goes up when he goes to medical offices.  He did bring with him a list of his blood pressures and they range in the 130s to [id number] range systolically.  We will continue his current medication regimen at this time.  May need to consider going up if  necessary.  5.  Ongoing tobacco abuse: He is aware of the dangers of continuing to smoke.  He again does not wish any interventions or plans to quit smoking.  He states he is "ready to go" when this time comes.  He does not want any life prolonging treatments.   Current medicines are reviewed at length with the patient today.  I have spent 25 minutes dedicated to the care of this patient on the date of this encounter to include pre-visit review of records, assessment, management and diagnostic testing,with shared decision making.  Labs/ tests ordered today include: None Phill Myron. West Pugh, ANP, AACC   03/02/2020 12:11 PM    [address] Office [phone] Fax [phone]  Notice: This dictation was prepared with Dragon dictation along with smaller phrase technology. Any transcriptional errors that result from this process are unintentional and may not be corrected upon review.

## 2020-03-02 ENCOUNTER — Ambulatory Visit (INDEPENDENT_AMBULATORY_CARE_PROVIDER_SITE_OTHER): Payer: Medicare Other | Admitting: Adult Health

## 2020-03-02 ENCOUNTER — Encounter: Payer: Self-pay | Admitting: Adult Health

## 2020-03-02 ENCOUNTER — Other Ambulatory Visit: Payer: Self-pay

## 2020-03-02 VITALS — BP 180/86 | HR 81 | Temp 97.0°F | Ht 69.0 in | Wt 196.4 lb

## 2020-03-02 DIAGNOSIS — I10 Essential (primary) hypertension: Secondary | ICD-10-CM

## 2020-03-02 DIAGNOSIS — J449 Chronic obstructive pulmonary disease, unspecified: Secondary | ICD-10-CM | POA: Diagnosis not present

## 2020-03-02 DIAGNOSIS — I2510 Atherosclerotic heart disease of native coronary artery without angina pectoris: Secondary | ICD-10-CM | POA: Diagnosis not present

## 2020-03-02 DIAGNOSIS — Z720 Tobacco use: Secondary | ICD-10-CM | POA: Diagnosis not present

## 2020-03-02 DIAGNOSIS — I480 Paroxysmal atrial fibrillation: Secondary | ICD-10-CM | POA: Diagnosis not present

## 2020-03-02 NOTE — Patient Instructions (Signed)
Medication Instructions:  Continue current medications  *If you need a refill on your cardiac medications before your next appointment, please call your pharmacy*   Lab Work: None ordered   Testing/Procedures: None Ordered   Follow-Up: At [facility], you and your health needs are our priority.  As part of our continuing mission to provide you with exceptional heart care, we have created designated Provider Care Teams.  These Care Teams include your primary Cardiologist (physician) and Advanced Practice Providers (APPs -  Physician Assistants and Nurse Practitioners) who all work together to provide you with the care you need, when you need it.  We recommend signing up for the patient portal called "MyChart".  Sign up information is provided on this After Visit Summary.  MyChart is used to connect with patients for Virtual Visits (Telemedicine).  Patients are able to view lab/test results, encounter notes, upcoming appointments, etc.  Non-urgent messages can be sent to your provider as well.   To learn more about what you can do with MyChart, go to [email or web address].    Your next appointment:   6 month(s)  The format for your next appointment:   In Person  Provider:   You may see Dr Oval Linsey or one of the following Advanced Practice Providers on your designated Care Team:    Kerin Ransom, PA-C  [place], [place]  Coletta Memos, [place]

## 2020-03-10 NOTE — Telephone Encounter (Signed)
Patient called Again today and states that [employer] Sent over paperwork for a 1 year renewal on the 19th and he has still not heard anything. He would really appreciate if someone would call him and update him.

## 2020-03-10 NOTE — Telephone Encounter (Signed)
Paperwork for [employer] was faxed on 02/29/20. Confirmation was received. Called pt, informed to contact [place] at [phone] regarding the application. Pt will let them know that paperwork was faxed over and to see what the else is required for the patient. He will return a call back with any update from [employer].

## 2020-03-10 NOTE — Telephone Encounter (Signed)
I believe I sent this, Christopher Armstrong can you check the faxes?  Has pt contacted [name] for an update?

## 2020-03-10 NOTE — Telephone Encounter (Signed)
Patient called back. States that they received the faxes but what they received was blank. Stated that they are sending another fax requesting whatever information was missing.

## 2020-03-11 ENCOUNTER — Other Ambulatory Visit: Payer: Self-pay | Admitting: Osteopathic Medicine

## 2020-03-11 MED ORDER — BUDESONIDE-FORMOTEROL FUMARATE 160-4.5 MCG/ACT IN AERO: 2.0000 | INHALATION_SPRAY | Freq: Two times a day (BID) | RESPIRATORY_TRACT | 99 refills | Status: DC

## 2020-03-11 NOTE — Telephone Encounter (Signed)
OK will address the fax when it comes in

## 2020-03-11 NOTE — Telephone Encounter (Signed)
Noted. Forwarding to provider as an [place].

## 2020-03-23 ENCOUNTER — Ambulatory Visit: Payer: Medicare Other

## 2020-03-24 ENCOUNTER — Ambulatory Visit: Payer: Medicare Other

## 2020-03-31 ENCOUNTER — Other Ambulatory Visit: Payer: Self-pay

## 2020-03-31 ENCOUNTER — Ambulatory Visit (INDEPENDENT_AMBULATORY_CARE_PROVIDER_SITE_OTHER): Payer: Medicare Other | Admitting: Osteopathic Medicine

## 2020-03-31 VITALS — BP 150/57 | HR 78

## 2020-03-31 DIAGNOSIS — I480 Paroxysmal atrial fibrillation: Secondary | ICD-10-CM

## 2020-03-31 LAB — POCT INR: INR: 2.4 (ref 2.0–3.0)

## 2020-03-31 NOTE — Progress Notes (Signed)
Patient reports taking Coumadin as follows:  Monday and Friday - 2.5 mg All other days - 5 mg   [name] denies any missed doses of medication, extra doses, bleeding gums, nose bleeds, antibiotic use, hospitalization, blood in urine, blood in stool, dental procedures, abnormal bleeding, or medication changes. He does bruise easily.   INR was checked today with a reading of 2.4. Patient will continue current dosage and follow up in 4 weeks for repeat INR check.

## 2020-03-31 NOTE — Patient Instructions (Signed)
INR today 2.4  Continue current Coumadin dosing: Monday and Friday - 2.5 mg  All other days - 5 mg   Follow up in 4 weeks for repeat INR check.

## 2020-04-26 ENCOUNTER — Other Ambulatory Visit: Payer: Self-pay

## 2020-04-26 ENCOUNTER — Ambulatory Visit (INDEPENDENT_AMBULATORY_CARE_PROVIDER_SITE_OTHER): Payer: Medicare Other | Admitting: Family Medicine

## 2020-04-26 ENCOUNTER — Encounter: Payer: Self-pay | Admitting: Family Medicine

## 2020-04-26 ENCOUNTER — Ambulatory Visit (INDEPENDENT_AMBULATORY_CARE_PROVIDER_SITE_OTHER): Payer: Medicare Other

## 2020-04-26 VITALS — BP 184/65 | HR 74 | Ht 68.9 in

## 2020-04-26 DIAGNOSIS — J449 Chronic obstructive pulmonary disease, unspecified: Secondary | ICD-10-CM

## 2020-04-26 DIAGNOSIS — I10 Essential (primary) hypertension: Secondary | ICD-10-CM

## 2020-04-26 DIAGNOSIS — R0602 Shortness of breath: Secondary | ICD-10-CM

## 2020-04-26 DIAGNOSIS — R42 Dizziness and giddiness: Secondary | ICD-10-CM

## 2020-04-26 DIAGNOSIS — R0600 Dyspnea, unspecified: Secondary | ICD-10-CM | POA: Diagnosis not present

## 2020-04-26 DIAGNOSIS — I480 Paroxysmal atrial fibrillation: Secondary | ICD-10-CM | POA: Diagnosis not present

## 2020-04-26 MED ORDER — DOXYCYCLINE HYCLATE 100 MG PO TABS: 100.0000 mg | ORAL_TABLET | Freq: Two times a day (BID) | ORAL | 0 refills | Status: DC

## 2020-04-26 NOTE — Progress Notes (Signed)
Christopher Armstrong - 79 y.o. male MRN [id number]  Date of birth: [birth year]  Subjective Chief Complaint  Patient presents with  . Fall  . Loss of Consciousness  . tooth infection    HPI Christopher Armstrong is a 79 y.o. male with history of PAF, COPD, and HTN here today with complaint of cough and dizziness.  Reports that he has had cough over the past few days with some mild wheezing and shortness of breath.  He was sitting on porch the other day and took medications on empty stomach and drank some "tussin" syrup for his cough.  Started to become dizzy and when he stood up he had worsening dizziness and he fell.  He did have a little nausea afterwards but this has resolved.  He reports since that time he has felt fatigued.  He did not hit his head but does have a hematoma on his R hip.  This has gotten little bigger bigger since the fall. He denies fever, chills, palpitations, chest pain, nausea, vomiting, diarrhea, headache or vision changes.  He does have a tooth that has been bothering him that needs extraction.  He has taken doxycycline for this with good results.   ROS:  A comprehensive ROS was completed and negative except as noted per HPI  Allergies  Allergen Reactions  . Clopidogrel Other (See Comments)    "Purple blood pockets clots in mouth" Blood blisters in roof of mouth, urinary retention   . Ticagrelor Shortness Of Breath and Other (See Comments)  . Atorvastatin Other (See Comments)  . Simvastatin   . Statins Nausea And Vomiting and Other (See Comments)    Past Medical History:  Diagnosis Date  . BPH (benign prostatic hyperplasia)   . CHF (congestive heart failure) ([place]) 06/23/2019  . COPD (chronic obstructive pulmonary disease) with chronic bronchitis ([place]) 08/26/2018  . Coronary artery disease of native artery of native heart with stable angina pectoris ([place]) 08/26/2018  . Essential hypertension 08/26/2018  . History of coronary artery stent placement 08/26/2018  . Mixed  hyperlipidemia 08/26/2018  . 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 Tobacco use disorder, severe, dependence 08/26/2018    Past Surgical History:  Procedure Laterality Date  . BLADDER TUMOR EXCISION    . CORONARY ANGIOPLASTY WITH STENT PLACEMENT    . PROSTATE SURGERY    . TONSILLECTOMY      Social History   Socioeconomic History  . Marital status: Single    Spouse name: Not on file  . Number of children: 1  . Years of education: 49  . Highest education level: 12th grade  Occupational History  . Occupation: Architect    Comment: retired  Tobacco Use  . Smoking status: Current Every Day Smoker    Packs/day: 0.50    Years: 65.00    Pack years: 32.50  . Smokeless tobacco: Never Used  Vaping Use  . Vaping Use: Never used  Substance and Sexual Activity  . Alcohol use: Not Currently    Alcohol/week: 0.0 standard drinks    Comment: 0  . Drug use: Never  . Sexual activity: Not Currently    Partners: Female  Other Topics Concern  . Not on file  Social History Narrative   Watch TV   Social Determinants of Health   Financial Resource Strain: Low Risk   . Difficulty of Paying Living Expenses: Not hard at all  Food Insecurity: No Food Insecurity  . Worried About Charity fundraiser in the Last Year: Never  true  . Ran Out of Food in the Last Year: Never true  Transportation Needs: No Transportation Needs  . Lack of Transportation (Medical): No  . Lack of Transportation (Non-Medical): No  Physical Activity: Inactive  . Days of Exercise per Week: 0 days  . Minutes of Exercise per Session: 0 min  Stress: No Stress Concern Present  . Feeling of Stress : Not at all  Social Connections: Unknown  . Frequency of Communication with Friends and Family: Once a week  . Frequency of Social Gatherings with Friends and Family: Once a week  . Attends Religious Services: Never  . Active Member of Clubs or Organizations: No  . Attends Archivist Meetings: Never  . Marital Status:  Patient refused    Family History  Problem Relation Age of Onset  . High blood pressure Mother   . Heart failure Mother   . Lung cancer Father   . High blood pressure Sister     Health Maintenance  Topic Date Due  . Hepatitis C Screening  Never done  . COVID-19 Vaccine (1) Never done  . TETANUS/TDAP  09/21/2020 (Originally 08/19/1960)  . INFLUENZA VACCINE  Discontinued  . PNA vac Low Risk Adult  Discontinued     ----------------------------------------------------------------------------------------------------------------------------------------------------------------------------------------------------------------- Physical Exam BP (!) 184/65 (BP Location: Left Arm, Patient Position: Sitting, Cuff Size: Large)   Pulse 74   Ht 5' 8.9" (1.75 m)   SpO2 94%   BMI 29.09 kg/m   Physical Exam Constitutional:      Appearance: Normal appearance.  HENT:     Head: Normocephalic and atraumatic.  Eyes:     General: No scleral icterus. Cardiovascular:     Rate and Rhythm: Normal rate and regular rhythm.  Pulmonary:     Effort: Pulmonary effort is normal.     Breath sounds: Normal breath sounds.  Musculoskeletal:     Cervical back: Neck supple.  Skin:    Comments: Bruising and hematoma located over R hip. ~4x4 inches.  Mildly tender.    Neurological:     General: No focal deficit present.     Mental Status: He is alert and oriented to person, place, and time.  Psychiatric:        Mood and Affect: Mood normal.        Behavior: Behavior normal.     ------------------------------------------------------------------------------------------------------------------------------------------------------------------------------------------------------------------- Assessment and Plan  Dizziness Dizziness improved, still with fatigue.  No significant neurological deficits.  He is unsteady on his feet but per sister this is baseline.  EKG completed showing NSR and prior inferior  infarct.  This is unchanged from previous tracing from 06/2019.   Has felt similar with previous episodes of pneumonia.  CXR ordered as well as cmp/cbc Will cover empirically with doxycycline for COPD exacerbation/pneumonia and dental infection.      Essential hypertension BP elevated today.  BP readings at home are better controlled.  He will continue current medications and monitoring at home.    Meds ordered this encounter  Medications  . doxycycline (VIBRA-TABS) 100 MG tablet    Sig: Take 1 tablet (100 mg total) by mouth 2 (two) times daily.    Dispense:  20 tablet    Refill:  0    Return in about 1 week (around 05/03/2020) for Fatigue/SOB.    This visit occurred during the SARS-CoV-2 public health emergency.  Safety protocols were in place, including screening questions prior to the visit, additional usage of staff PPE, and extensive cleaning of exam room  while observing appropriate contact time as indicated for disinfecting solutions.

## 2020-04-26 NOTE — Assessment & Plan Note (Addendum)
Dizziness improved, still with fatigue.  No significant neurological deficits.  He is unsteady on his feet but per sister this is baseline.  EKG completed showing NSR and prior inferior infarct.  This is unchanged from previous tracing from 06/2019.   Has felt similar with previous episodes of pneumonia.  CXR ordered as well as cmp/cbc Will cover empirically with doxycycline for COPD exacerbation/pneumonia and dental infection.

## 2020-04-26 NOTE — Assessment & Plan Note (Signed)
BP elevated today.  BP readings at home are better controlled.  He will continue current medications and monitoring at home.

## 2020-04-26 NOTE — Patient Instructions (Addendum)
Start doxycycline twice daily.  Have xray and labs completed.  We'll be in touch with results.  Keep an eye on BP at home. Follow up in 1 week.

## 2020-04-27 LAB — CBC
HCT: 41.8 % (ref 38.5–50.0)
Hemoglobin: 13.8 g/dL (ref 13.2–17.1)
MCH: 30.9 pg (ref 27.0–33.0)
MCHC: 33 g/dL (ref 32.0–36.0)
MCV: 93.5 fL (ref 80.0–100.0)
MPV: 13.3 fL — ABNORMAL HIGH (ref 7.5–12.5)
Platelets: 152 10*3/uL (ref 140–400)
RBC: 4.47 10*6/uL (ref 4.20–5.80)
RDW: 12.7 % (ref 11.0–15.0)
WBC: 8.5 10*3/uL (ref 3.8–10.8)

## 2020-04-27 LAB — COMPLETE METABOLIC PANEL WITH GFR
AG Ratio: 1.6 (calc) (ref 1.0–2.5)
ALT: 18 U/L (ref 9–46)
AST: 23 U/L (ref 10–35)
Albumin: 4.4 g/dL (ref 3.6–5.1)
Alkaline phosphatase (APISO): 55 U/L (ref 35–144)
BUN: 14 mg/dL (ref 7–25)
CO2: 31 mmol/L (ref 20–32)
Calcium: 9.1 mg/dL (ref 8.6–10.3)
Chloride: 101 mmol/L (ref 98–110)
Creat: 1 mg/dL (ref 0.70–1.18)
GFR, Est African American: 84 mL/min/{1.73_m2} (ref >=60)
GFR, Est Non African American: 73 mL/min/{1.73_m2} (ref >=60)
Globulin: 2.7 g/dL (calc) (ref 1.9–3.7)
Glucose, Bld: 92 mg/dL (ref 65–139)
Potassium: 4 mmol/L (ref 3.5–5.3)
Sodium: 140 mmol/L (ref 135–146)
Total Bilirubin: 0.8 mg/dL (ref 0.2–1.2)
Total Protein: 7.1 g/dL (ref 6.1–8.1)

## 2020-05-09 ENCOUNTER — Encounter: Payer: Self-pay | Admitting: Osteopathic Medicine

## 2020-05-09 ENCOUNTER — Ambulatory Visit (INDEPENDENT_AMBULATORY_CARE_PROVIDER_SITE_OTHER): Payer: Medicare Other | Admitting: Osteopathic Medicine

## 2020-05-09 ENCOUNTER — Ambulatory Visit: Payer: Medicare Other

## 2020-05-09 ENCOUNTER — Ambulatory Visit (INDEPENDENT_AMBULATORY_CARE_PROVIDER_SITE_OTHER): Payer: Medicare Other

## 2020-05-09 ENCOUNTER — Other Ambulatory Visit: Payer: Self-pay

## 2020-05-09 VITALS — BP 182/88 | HR 73 | Wt 192.0 lb

## 2020-05-09 DIAGNOSIS — Z7901 Long term (current) use of anticoagulants: Secondary | ICD-10-CM | POA: Diagnosis not present

## 2020-05-09 DIAGNOSIS — Z5181 Encounter for therapeutic drug level monitoring: Secondary | ICD-10-CM

## 2020-05-09 DIAGNOSIS — I480 Paroxysmal atrial fibrillation: Secondary | ICD-10-CM

## 2020-05-09 DIAGNOSIS — R55 Syncope and collapse: Secondary | ICD-10-CM | POA: Diagnosis not present

## 2020-05-09 DIAGNOSIS — R4020 Unspecified coma: Secondary | ICD-10-CM

## 2020-05-09 DIAGNOSIS — I10 Essential (primary) hypertension: Secondary | ICD-10-CM

## 2020-05-09 DIAGNOSIS — J449 Chronic obstructive pulmonary disease, unspecified: Secondary | ICD-10-CM

## 2020-05-09 DIAGNOSIS — W19XXXD Unspecified fall, subsequent encounter: Secondary | ICD-10-CM

## 2020-05-09 DIAGNOSIS — I2510 Atherosclerotic heart disease of native coronary artery without angina pectoris: Secondary | ICD-10-CM | POA: Diagnosis not present

## 2020-05-09 DIAGNOSIS — R42 Dizziness and giddiness: Secondary | ICD-10-CM

## 2020-05-09 DIAGNOSIS — R791 Abnormal coagulation profile: Secondary | ICD-10-CM | POA: Diagnosis not present

## 2020-05-09 DIAGNOSIS — I672 Cerebral atherosclerosis: Secondary | ICD-10-CM | POA: Diagnosis not present

## 2020-05-09 DIAGNOSIS — R1110 Vomiting, unspecified: Secondary | ICD-10-CM | POA: Diagnosis not present

## 2020-05-09 LAB — CBC
HCT: 41.9 % (ref 38.5–50.0)
Hemoglobin: 13.8 g/dL (ref 13.2–17.1)
MCH: 30.3 pg (ref 27.0–33.0)
MCHC: 32.9 g/dL (ref 32.0–36.0)
MCV: 92.1 fL (ref 80.0–100.0)
MPV: 13.5 fL — ABNORMAL HIGH (ref 7.5–12.5)
Platelets: 147 10*3/uL (ref 140–400)
RBC: 4.55 10*6/uL (ref 4.20–5.80)
RDW: 13 % (ref 11.0–15.0)
WBC: 9.7 10*3/uL (ref 3.8–10.8)

## 2020-05-09 LAB — COMPLETE METABOLIC PANEL WITH GFR
AG Ratio: 1.5 (calc) (ref 1.0–2.5)
ALT: 13 U/L (ref 9–46)
AST: 18 U/L (ref 10–35)
Albumin: 4.2 g/dL (ref 3.6–5.1)
Alkaline phosphatase (APISO): 58 U/L (ref 35–144)
BUN: 14 mg/dL (ref 7–25)
CO2: 28 mmol/L (ref 20–32)
Calcium: 9.2 mg/dL (ref 8.6–10.3)
Chloride: 102 mmol/L (ref 98–110)
Creat: 0.9 mg/dL (ref 0.70–1.18)
GFR, Est African American: 96 mL/min/{1.73_m2} (ref >=60)
GFR, Est Non African American: 83 mL/min/{1.73_m2} (ref >=60)
Globulin: 2.8 g/dL (calc) (ref 1.9–3.7)
Glucose, Bld: 90 mg/dL (ref 65–99)
Potassium: 4 mmol/L (ref 3.5–5.3)
Sodium: 140 mmol/L (ref 135–146)
Total Bilirubin: 0.6 mg/dL (ref 0.2–1.2)
Total Protein: 7 g/dL (ref 6.1–8.1)

## 2020-05-09 LAB — TSH: TSH: 1.37 mIU/L (ref 0.40–4.50)

## 2020-05-09 LAB — CP4508-PT/INR AND PTT
INR: 1.7 — ABNORMAL HIGH
Prothrombin Time: 17.5 s — ABNORMAL HIGH (ref 9.0–11.5)
aPTT: 41 s — ABNORMAL HIGH (ref 23–32)

## 2020-05-09 LAB — POCT INR: INR: 11.2 — AB (ref 2.0–3.0)

## 2020-05-09 MED ORDER — CARVEDILOL 12.5 MG PO TABS: 12.5000 mg | ORAL_TABLET | Freq: Two times a day (BID) | ORAL | 3 refills | Status: DC

## 2020-05-09 NOTE — Patient Instructions (Addendum)
Plan:   Blood work today (sorry) to confirm high INR / blood too thin  Scan head/brain today since you are dizzy and blood might be too thin, and given your recent fall - we need to confirm there is no brain bleeding   Blood pressure is dropping a bit, but I'm also worried about low oxygen levels. I've sent referral to pulmonologist.   I'd also like to decrease your Carvedilol from one and a half tablets to just ONE tablet, this may be contributing to your dizziness

## 2020-05-09 NOTE — Progress Notes (Signed)
HPI: Christopher Armstrong is a 79 y.o. male who  has a past medical history of BPH (benign prostatic hyperplasia), CHF (congestive heart failure) ([place]) (06/23/2019), COPD (chronic obstructive pulmonary disease) with chronic bronchitis ([place]) (08/26/2018), Coronary artery disease of native artery of native heart with stable angina pectoris ([place]) (08/26/2018), Essential hypertension (08/26/2018), History of coronary artery stent placement (08/26/2018), Mixed hyperlipidemia (08/26/2018), Statin myopathy (08/26/2018), and Tobacco use disorder, severe, dependence (08/26/2018).  he presents to [facility] today, 05/09/20,  for chief complaint of: INR check, dizziness  Patient reports he has been having episodes of nausea and feeling unbalanced intermittently over the last 10 days or so. He states the symptoms are worse when he leans forward and on standing. He believes these symptoms are related to his medications, specifically he is concerned Symbicort. He states the last two days, the symptoms have occurred right after taking symbicort, but do not coincide with his nighttime dose. He has been taking symbicort for 3 years with no prior issues. He states they also occurred after taking guaifenesin once about 10 days ago, so he has not taken that since. He states he was supposed to follow up with a pulmonologist last year, but it didn't happen because of COVID, so he would like a new referral for that. Currently, he is taking his symbicort every 4 hours, even waking up at 2-3am to use it. He is also using his albuterol 1-2 puffs 4 times per day.    He states he had a syncopal episode about 10 days ago. He was leaning on a stool and felt like he was going to pass out, so he stood and moved to the side and collapsed. He lost consciousness for a moment but believed he did not hit his head as he did not have any bruising or swelling to his head. He did have significant bruising to his right  arm and right hip after the fall, which has improved since that time. He saw Dr. Zigmund Daniel in this office at that time, few days after event - treated for PNA/COPD exacerbation on Doxycycline   Patient is accompanied by his sister who assists with history-taking.       Past medical, surgical, social and family history reviewed:  Patient Active Problem List   Diagnosis Date Noted  . Dizziness 04/26/2020  . Shortness of breath 11/05/2019  . Hyperglycemia 11/04/2019  . Community acquired pneumonia of right lower lobe of lung 11/04/2019  . Bradycardia 11/04/2019  . Bilateral lower extremity edema 11/04/2019  . CHF (congestive heart failure) ([place]) 06/23/2019  . COPD (chronic obstructive pulmonary disease) with chronic bronchitis ([place]) 08/26/2018  . Coronary artery disease of native artery of native heart with stable angina pectoris ([place]) 08/26/2018  . Mixed hyperlipidemia 08/26/2018  . Essential hypertension 08/26/2018  . 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 Tobacco use disorder, severe, dependence 08/26/2018  . History of coronary artery stent placement 08/26/2018  . White coat syndrome with hypertension 08/26/2018  . Eyelid abnormality 08/26/2018  . Lung nodule 08/26/2018  . Abnormal findings on diagnostic imaging of lung 08/26/2018    Past Surgical History:  Procedure Laterality Date  . BLADDER TUMOR EXCISION    . CORONARY ANGIOPLASTY WITH STENT PLACEMENT    . PROSTATE SURGERY    . TONSILLECTOMY      Social History   Tobacco Use  . Smoking status: Current Every Day Smoker    Packs/day: 0.50    Years: 65.00  Pack years: 32.50  . Smokeless tobacco: Never Used  Substance Use Topics  . Alcohol use: Not Currently    Alcohol/week: 0.0 standard drinks    Comment: 0    Family History  Problem Relation Age of Onset  . High blood pressure Mother   . Heart failure Mother   . Lung cancer Father   . High blood pressure Sister      Current medication list and  allergy/intolerance information reviewed:    Current Outpatient Medications  Medication Sig Dispense Refill  . albuterol (VENTOLIN HFA) 108 (90 Base) MCG/ACT inhaler Inhale 1-2 puffs into the lungs every 6 (six) hours as needed for wheezing or shortness of breath. 18 g 99  . aspirin EC 81 MG tablet Take 1 tablet (81 mg total) by mouth daily. 90 tablet 3  . budesonide-formoterol (SYMBICORT) 160-4.5 MCG/ACT inhaler Inhale 2 puffs into the lungs 2 (two) times daily. 3 Inhaler 99  . carvedilol (COREG) 12.5 MG tablet Take 1 tablet (12.5 mg total) by mouth 2 (two) times daily. 270 tablet 3  . diltiazem (CARDIZEM) 30 MG tablet Take 1 tablet (30 mg total) by mouth as needed. 30 tablet 2  . doxycycline (VIBRA-TABS) 100 MG tablet Take 1 tablet (100 mg total) by mouth 2 (two) times daily. 20 tablet 0  . furosemide (LASIX) 40 MG tablet Take 1 tablet (40 mg total) by mouth daily. 90 tablet 0  . ipratropium-albuterol (DUONEB) 0.5-2.5 (3) MG/3ML SOLN Take 3 mLs by nebulization 5 (five) times daily. 1350 mL 99  . lisinopril (ZESTRIL) 20 MG tablet Take 2 tablets (40 mg total) by mouth daily. 90 tablet 3  . magnesium hydroxide (MILK OF MAGNESIA) 400 MG/5ML suspension Take 30 mLs by mouth as needed.    . nitroGLYCERIN (NITROSTAT) 0.4 MG SL tablet Place 0.4 mg under the tongue every 5 (five) minutes as needed for chest pain.    . OXYGEN Inhale into the lungs at bedtime.    Marland Kitchen warfarin (COUMADIN) 5 MG tablet TAKE 1/2 TO 1 (ONE-HALF TO ONE) TABLET BY MOUTH AS DIRECTED BY  COUMADIN  CLINIC 90 tablet 0   No current facility-administered medications for this visit.    Allergies  Allergen Reactions  . Clopidogrel Other (See Comments)    "Purple blood pockets clots in mouth" Blood blisters in roof of mouth, urinary retention   . Ticagrelor Shortness Of Breath and Other (See Comments)  . Atorvastatin Other (See Comments)  . Simvastatin   . Statins Nausea And Vomiting and Other (See Comments)      Review of  Systems:  Constitutional:  No  fever, no chills  HEENT: No  headache, no vision change  Cardiac: No  chest pain  Respiratory:  +  shortness of breath - chronic, maybe a bit above baseline. No  Cough  Gastrointestinal: No  abdominal pain, No  nausea, No  vomiting, No  diarrhea, No  constipation   Musculoskeletal: No new myalgia/arthralgia  Skin: Healing abrasions / bruising from fall   Hem/Onc: +  easy bruising/bleeding (on coumadin), No  abnormal lymph node  Endocrine: No cold intolerance,  No heat intolerance. No polyuria/polydipsia/polyphagia   Neurologic: +  weakness, +  dizziness, No  slurred speech/focal weakness/facial droop  Psychiatric: No  concerns with depression, No  concerns with anxiety, No sleep problems, No mood problems  Exam:  BP (!) 182/88 (BP Location: Left Arm, Patient Position: Sitting)   Pulse 73   Wt 192 lb (87.1 kg)  SpO2 96%   BMI 28.44 kg/m   Orthostatic VS for the past 24 hrs (Last 3 readings):  BP- Lying Pulse- Lying BP- Sitting Pulse- Sitting BP- Standing at 0 minutes Pulse- Standing at 0 minutes  05/09/20 1149 199/79 72 195/80 72 180/78 79    Constitutional: VS see above. Orthostatic hypotension. General Appearance: alert, well-developed, well-nourished, NAD  Eyes: Normal lids and conjunctive, non-icteric sclera  Neck: No masses, trachea midline.  Respiratory: Normal respiratory effort. Diminished breath sounds throughout. no wheeze, no rhonchi, no rales  Cardiovascular: S1/S2 normal, no murmur, no rub/gallop auscultated. RRR. 2+ lower extremity edema bilaterally.   Gastrointestinal: Nontender, no masses.  Musculoskeletal: Gait normal. No clubbing/cyanosis of digits.   Neurological: No tremor. No cranial nerve deficit on limited exam. Motor and sensation intact and symmetric.  Skin: warm, dry. No rash/ulcer. No concerning nevi or subq nodules on limited exam.   Psychiatric: Normal judgment/insight. Normal mood and affect. Oriented  x3.   No results found for this or any previous visit (from the past 72 hour(s)).  CT Head Wo Contrast  Result Date: 05/09/2020 CLINICAL DATA:  Dizziness. Syncopal episode 11 days ago. Subsequent vomiting. EXAM: CT HEAD WITHOUT CONTRAST TECHNIQUE: Contiguous axial images were obtained from the base of the skull through the vertex without intravenous contrast. COMPARISON:  None. FINDINGS: Brain: Age related volume loss. No evidence of old or acute focal infarction, mass lesion, hemorrhage, hydrocephalus or extra-axial collection. Vascular: There is atherosclerotic calcification of the major vessels at the base of the brain. Skull: Negative Sinuses/Orbits: Clear/normal Other: None IMPRESSION: No acute or focal finding.  Age related volume loss. Electronically Signed   By: Nelson Chimes M.D.   On: 05/09/2020 11:51        ASSESSMENT/PLAN: The primary encounter diagnosis was Monitoring for anticoagulant use. Diagnoses of Paroxysmal atrial fibrillation (HCC), COPD (chronic obstructive pulmonary disease) with chronic bronchitis ([place]), White coat syndrome with hypertension, Dizziness, Supratherapeutic INR, Fall, subsequent encounter, and LOC (loss of consciousness) ([place]) were also pertinent to this visit.   Anti-coagulation for paroxysmal atrial fibrillation  POC INR 11.2. Will recheck on blood draw to confirm. If this INR is correct, will need to discontinue coumadin prescription. Pt advised hold today's dose until further notice   Pt bruises easily, but no abnormal bleeding.   Recheck INR pending results   Dizziness, nausea  Given recent fall with LOC, persistent dizziness, and concerning INR, ordered CT head to evaluate for hemorrhage or other abnormality.  More concern for orthostatic hypotension, possible low O2. Pt was orthostatic in office. Carvedilol was recently increased by cardiology to 1.5 tablets. Today, recommended patient decrease back to 1 tablet.  Also ordered CBC, CMP, and TSH  to evaluate for other causes of dizziness. EKG in office 04/26/2020 was ok  COPD  Pt is currently using symbicort q4hrs and albuterol QID. Referral sent to pulmonology last year, but patient was unable to follow up in [place]. Resent referral.  Orders Placed This Encounter  Procedures  . CT Head Wo Contrast  . INR/PT  . CBC  . COMPLETE METABOLIC PANEL WITH GFR  . TSH  . [id number]/INR AND PTT  . Ambulatory referral to Pulmonology    Meds ordered this encounter  Medications  . carvedilol (COREG) 12.5 MG tablet    Sig: Take 1 tablet (12.5 mg total) by mouth 2 (two) times daily.    Dispense:  270 tablet    Refill:  3    Patient Instructions  Plan:  Blood work today (sorry) to confirm high INR / blood too thin  Scan head/brain today since you are dizzy and blood might be too thin, and given your recent fall - we need to confirm there is no brain bleeding   Blood pressure is dropping a bit, but I'm also worried about low oxygen levels. I've sent referral to pulmonologist.   I'd also like to decrease your Carvedilol from one and a half tablets to just ONE tablet, this may be contributing to your dizziness             Visit summary with medication list and pertinent instructions was printed for patient to review. All questions at time of visit were answered - patient instructed to contact office with any additional concerns or updates. ER/RTC precautions were reviewed with the patient.   Please note: voice recognition software was used to produce this document, and typos may escape review. Please contact Dr. Sheppard Coil for any needed clarifications.     Follow-up plan: Return for RECHECK PENDING RESULTS / IF WORSE OR CHANGE.  Total time spent 40 minutes   Gweneth Dimitri, [facility] MS3

## 2020-05-09 NOTE — Progress Notes (Signed)
Patient's INR reading was extremely high this am, which was abnormal. We performed another INR check that resulted ay 13.2 after the first INR. Patient was sent down stairs for blood draw to confirm readings.

## 2020-05-10 ENCOUNTER — Ambulatory Visit: Payer: Medicare Other

## 2020-05-17 ENCOUNTER — Ambulatory Visit (INDEPENDENT_AMBULATORY_CARE_PROVIDER_SITE_OTHER): Payer: Medicare Other | Admitting: Osteopathic Medicine

## 2020-05-17 ENCOUNTER — Other Ambulatory Visit: Payer: Self-pay

## 2020-05-17 ENCOUNTER — Encounter: Payer: Self-pay | Admitting: Osteopathic Medicine

## 2020-05-17 VITALS — BP 164/96 | HR 90 | Wt 190.0 lb

## 2020-05-17 DIAGNOSIS — I2510 Atherosclerotic heart disease of native coronary artery without angina pectoris: Secondary | ICD-10-CM | POA: Diagnosis not present

## 2020-05-17 DIAGNOSIS — Z66 Do not resuscitate: Secondary | ICD-10-CM | POA: Diagnosis not present

## 2020-05-17 DIAGNOSIS — I480 Paroxysmal atrial fibrillation: Secondary | ICD-10-CM

## 2020-05-17 DIAGNOSIS — J449 Chronic obstructive pulmonary disease, unspecified: Secondary | ICD-10-CM | POA: Diagnosis not present

## 2020-05-17 DIAGNOSIS — I10 Essential (primary) hypertension: Secondary | ICD-10-CM

## 2020-05-17 NOTE — Patient Instructions (Addendum)
Plan:  Pulmonology:  [facility] Address: [address], [place], [place] [address] Phone: [phone]   Coumadin: 5 mg daily  Recheck INR in one week  Water pills: Can reduce Lasix from 40 mg to 20 mg - take 1/2 tablet If increased lower leg swelling, trouble breathing with lying down on your back, or 5 lbs weight gain in 1-2 days, increase back to 40 mg   Advanced Directives & DNR order: See attached Let me know if any questions!

## 2020-05-17 NOTE — Progress Notes (Signed)
HPI: Christopher Armstrong is a 79 y.o. male who  has a past medical history of BPH (benign prostatic hyperplasia), CHF (congestive heart failure) ([place]) (06/23/2019), COPD (chronic obstructive pulmonary disease) with chronic bronchitis ([place]) (08/26/2018), Coronary artery disease of native artery of native heart with stable angina pectoris ([place]) (08/26/2018), Essential hypertension (08/26/2018), History of coronary artery stent placement (08/26/2018), Mixed hyperlipidemia (08/26/2018), Statin myopathy (08/26/2018), and Tobacco use disorder, severe, dependence (08/26/2018).  he presents to [facility] today, 05/17/20,  for chief complaint of: INR Recheck  Patient reports he has been doing better since his visit last week. His dizziness has improved. He denies any new abnormal bleeding or bruising. He states he had one episode of atrial fibrillation 3 days ago, which resolved when he took diltiazem. He is also concerned that his lasix is dehydrating him and he would like to take a lower dose. The swelling in his legs has improved since his last visit.  Patient states he has not heard back from the pulmonologist yet. He does not want to follow up with his cardiologist, as he states he "doesn't want to be going from doctor to doctor." When asked about his wishes should his condition worsen, he states he is DNR. He has not done paperwork for this, but states he tells the hospital this every time he is admitted. He states he "wants to make it to 75 because that's his lucky number and after that what happens happens." He has not had a COVID shot yet, but he will go to [facility] to get one. He does not want the [employer] as he is concerned about the blood clot risk.  Past medical, surgical, social and family history reviewed:  Patient Active Problem List   Diagnosis Date Noted  . Dizziness 04/26/2020  . Shortness of breath 11/05/2019  . Hyperglycemia 11/04/2019  . Community acquired  pneumonia of right lower lobe of lung 11/04/2019  . Bradycardia 11/04/2019  . Bilateral lower extremity edema 11/04/2019  . CHF (congestive heart failure) ([place]) 06/23/2019  . COPD (chronic obstructive pulmonary disease) with chronic bronchitis ([place]) 08/26/2018  . Coronary artery disease of native artery of native heart with stable angina pectoris ([place]) 08/26/2018  . Mixed hyperlipidemia 08/26/2018  . Essential hypertension 08/26/2018  . 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 Tobacco use disorder, severe, dependence 08/26/2018  . History of coronary artery stent placement 08/26/2018  . White coat syndrome with hypertension 08/26/2018  . Eyelid abnormality 08/26/2018  . Lung nodule 08/26/2018  . Abnormal findings on diagnostic imaging of lung 08/26/2018    Past Surgical History:  Procedure Laterality Date  . BLADDER TUMOR EXCISION    . CORONARY ANGIOPLASTY WITH STENT PLACEMENT    . PROSTATE SURGERY    . TONSILLECTOMY      Social History   Tobacco Use  . Smoking status: Current Every Day Smoker    Packs/day: 0.50    Years: 65.00    Pack years: 32.50  . Smokeless tobacco: Never Used  Substance Use Topics  . Alcohol use: Not Currently    Alcohol/week: 0.0 standard drinks    Comment: 0    Family History  Problem Relation Age of Onset  . High blood pressure Mother   . Heart failure Mother   . Lung cancer Father   . High blood pressure Sister      Current medication list and allergy/intolerance information reviewed:    Current Outpatient Medications  Medication Sig Dispense Refill  .  albuterol (VENTOLIN HFA) 108 (90 Base) MCG/ACT inhaler Inhale 1-2 puffs into the lungs every 6 (six) hours as needed for wheezing or shortness of breath. 18 g 99  . aspirin EC 81 MG tablet Take 1 tablet (81 mg total) by mouth daily. 90 tablet 3  . budesonide-formoterol (SYMBICORT) 160-4.5 MCG/ACT inhaler Inhale 2 puffs into the lungs 2 (two) times daily. 3 Inhaler 99  . carvedilol (COREG) 12.5 MG  tablet Take 1 tablet (12.5 mg total) by mouth 2 (two) times daily. 270 tablet 3  . diltiazem (CARDIZEM) 30 MG tablet Take 1 tablet (30 mg total) by mouth as needed. 30 tablet 2  . doxycycline (VIBRA-TABS) 100 MG tablet Take 1 tablet (100 mg total) by mouth 2 (two) times daily. 20 tablet 0  . lisinopril (ZESTRIL) 20 MG tablet Take 2 tablets (40 mg total) by mouth daily. 90 tablet 3  . magnesium hydroxide (MILK OF MAGNESIA) 400 MG/5ML suspension Take 30 mLs by mouth as needed.    . nitroGLYCERIN (NITROSTAT) 0.4 MG SL tablet Place 0.4 mg under the tongue every 5 (five) minutes as needed for chest pain.    . OXYGEN Inhale into the lungs at bedtime.    Marland Kitchen warfarin (COUMADIN) 5 MG tablet TAKE 1/2 TO 1 (ONE-HALF TO ONE) TABLET BY MOUTH AS DIRECTED BY  COUMADIN  CLINIC 90 tablet 0  . furosemide (LASIX) 40 MG tablet Take 1 tablet (40 mg total) by mouth daily. 90 tablet 0  . ipratropium-albuterol (DUONEB) 0.5-2.5 (3) MG/3ML SOLN Take 3 mLs by nebulization 5 (five) times daily. 1350 mL 99   No current facility-administered medications for this visit.    Allergies  Allergen Reactions  . Clopidogrel Other (See Comments)    "Purple blood pockets clots in mouth" Blood blisters in roof of mouth, urinary retention   . Ticagrelor Shortness Of Breath and Other (See Comments)  . Atorvastatin Other (See Comments)  . Simvastatin   . Statins Nausea And Vomiting and Other (See Comments)      Review of Systems:  Constitutional:  No  fever, no chills  HEENT: No  headache  Cardiac: No  chest pain, No palpitations  Respiratory:  No new shortness of breath. No  Cough  Gastrointestinal: No  abdominal pain  Musculoskeletal: No new myalgia/arthralgia  Skin: No  Rash, No other wounds/concerning lesions  Hem/Onc: No new easy bruising/bleeding, No  abnormal lymph node  Neurologic: No  weakness, No  dizziness  Psychiatric: No  concerns with depression, No  concerns with anxiety, No sleep problems, No mood  problems  Exam:  BP (!) 164/96 (BP Location: Left Arm, Patient Position: Sitting)   Pulse 90   Wt 190 lb (86.2 kg)   SpO2 99%   BMI 28.14 kg/m   Constitutional: VS see above. General Appearance: alert, well-developed, well-nourished, NAD  Eyes: Normal lids and conjunctive, non-icteric sclera  Neck: No masses, trachea midline.   Respiratory: Diminished breath sounds throughout. Normal respiratory effort. no wheeze, no rhonchi, no rales  Cardiovascular: S1/S2 normal, no murmur, no rub/gallop auscultated. RRR. Trace lower extremity edema.  Gastrointestinal: Nontender, no masses.   Musculoskeletal: Gait normal. No clubbing/cyanosis of digits.   Neurological: Normal balance/coordination. No tremor. No cranial nerve deficit on limited exam.  Skin: warm, dry, intact. No rash/ulcer. No concerning nevi or subq nodules on limited exam.   Psychiatric: Normal judgment/insight. Normal mood and affect. Oriented x3.    No results found for this or any previous visit (from the past  72 hour(s)).  No results found.   ASSESSMENT/PLAN: The primary encounter diagnosis was Paroxysmal atrial fibrillation ([place]). Diagnoses of COPD (chronic obstructive pulmonary disease) with chronic bronchitis ([place]), Essential hypertension, White coat syndrome with hypertension, and DNR no code (do not resuscitate) were also pertinent to this visit.   Anticoagulation for atrial fibrillation  POC INR 1.8 (up from 1.7 last week).  Instructed patient to take 5mg  every day and return for INR recheck in one week.  One episode of Afib 3 days ago, which resolved with diltiazem.  Decreased leg swelling  Pt requested lower dose of lasix. Advised taking 20mg  daily, and can increase dose if swelling worsens.  Recommended compression socks to help with swelling.  Advanced Directive  Discussed patient's wishes today. Gave him information regarding healthcare POA and advanced directive.  Completed DNR paperwork  with patient and sent form home with him. Recommended he keep this on his fridge as EMS will look there for it.  No orders of the defined types were placed in this encounter.   No orders of the defined types were placed in this encounter.   Patient Instructions  Plan:  Pulmonology:  [facility] Address: [address], [place], [place] [address] Phone: [phone]   Coumadin: 5 mg daily  Recheck INR in one week  Water pills: Can reduce Lasix from 40 mg to 20 mg - take 1/2 tablet If increased lower leg swelling, trouble breathing with lying down on your back, or 5 lbs weight gain in 1-2 days, increase back to 40 mg   Advanced Directives & DNR order: See attached Let me know if any questions!          Visit summary with medication list and pertinent instructions was printed for patient to review. All questions at time of visit were answered - patient instructed to contact office with any additional concerns or updates. ER/RTC precautions were reviewed with the patient.   Note: Total time spent 30 minutes, greater than 50% of the visit was spent face-to-face counseling and coordinating care for the above diagnoses listed in assessment/plan.   Please note: voice recognition software was used to produce this document, and typos may escape review. Please contact Dr. Sheppard Coil for any needed clarifications.     Follow-up plan: Return in about 1 week (around 05/24/2020) for NURSE VISIT RECHECK INR, FOLLOW UP W/ DR Sheppard Coil IN 4 MONTHS - COPD.   Gweneth Dimitri, [facility] MS3

## 2020-05-19 ENCOUNTER — Other Ambulatory Visit: Payer: Self-pay

## 2020-05-19 NOTE — Telephone Encounter (Signed)
Christopher Armstrong states he needs a refill on nitroglycerin. Historical provider.

## 2020-05-20 MED ORDER — NITROGLYCERIN 0.4 MG SL SUBL: 0.4000 mg | SUBLINGUAL_TABLET | SUBLINGUAL | 1 refills | Status: DC | PRN

## 2020-05-20 NOTE — Telephone Encounter (Signed)
Patient advised.

## 2020-05-20 NOTE — Telephone Encounter (Signed)
[name] called back requesting a refill on the nitroglycerin.

## 2020-05-24 ENCOUNTER — Ambulatory Visit (INDEPENDENT_AMBULATORY_CARE_PROVIDER_SITE_OTHER): Payer: Medicare Other | Admitting: Osteopathic Medicine

## 2020-05-24 VITALS — BP 193/71 | HR 84

## 2020-05-24 DIAGNOSIS — I480 Paroxysmal atrial fibrillation: Secondary | ICD-10-CM | POA: Diagnosis not present

## 2020-05-24 LAB — POCT INR: INR: 2.2 (ref 2.0–3.0)

## 2020-05-24 NOTE — Progress Notes (Signed)
Patient reports taking Coumadin as follows:  5 mg daily   Christopher Armstrong denies any missed doses of medication, extra doses, bleeding gums, nose bleeds, antibiotic use, hospitalization, blood in urine, blood in stool, dental procedures, any bruising, abnormal bleeding, or medication changes.  INR was checked today with a reading of 2.2. He will remain on current dose and return for repeat INR in one month per Dr. Sheppard Coil.   Blood pressure elevated at 204/67, then rechecked at 193/71. Per Dr. Sheppard Coil, this is patient's normal readings. Encouraged him to follow up with pulmonology as he has had difficulty with COPD. Patient waiting to hear about appointment. Will call if anything changes.

## 2020-05-24 NOTE — Progress Notes (Signed)
White coat HTN - pt monitors at home

## 2020-06-07 ENCOUNTER — Other Ambulatory Visit: Payer: Self-pay | Admitting: Osteopathic Medicine

## 2020-06-07 ENCOUNTER — Other Ambulatory Visit: Payer: Self-pay | Admitting: Adult Health

## 2020-06-07 ENCOUNTER — Other Ambulatory Visit: Payer: Self-pay | Admitting: Cardiology

## 2020-06-08 ENCOUNTER — Telehealth: Payer: Self-pay | Admitting: Cardiology

## 2020-06-08 MED ORDER — WARFARIN SODIUM 5 MG PO TABS: ORAL_TABLET | ORAL | 0 refills | Status: DC

## 2020-06-08 NOTE — Telephone Encounter (Signed)
  Pt c/o medication issue:  1. Name of Medication:  warfarin (COUMADIN) 5 MG tablet  2. How are you currently taking this medication (dosage and times per day)? Not sure  3. Are you having a reaction (difficulty breathing--STAT)? no  4. What is your medication issue? Jonelle Sidle from [facility] states the refill was sent to them, but does not indicate frequency of how to take it and the day supply is not populated.

## 2020-06-08 NOTE — Telephone Encounter (Signed)
CORRECT REFILL SENT IN

## 2020-06-21 ENCOUNTER — Ambulatory Visit (INDEPENDENT_AMBULATORY_CARE_PROVIDER_SITE_OTHER): Payer: Medicare Other | Admitting: Osteopathic Medicine

## 2020-06-21 ENCOUNTER — Other Ambulatory Visit: Payer: Self-pay

## 2020-06-21 DIAGNOSIS — I480 Paroxysmal atrial fibrillation: Secondary | ICD-10-CM | POA: Diagnosis not present

## 2020-06-21 LAB — POCT INR: INR: 2.5 (ref 2.0–3.0)

## 2020-06-21 NOTE — Progress Notes (Signed)
Patient's blood pressure elevated with first and second checks. Patient confirms he has taken all medications as directed. States that he checks blood pressures at home and "they are normal".   Advised provider of blood pressures. Patient advised to check blood pressures at home and bring log in to next INR check

## 2020-07-06 ENCOUNTER — Institutional Professional Consult (permissible substitution): Payer: Medicare Other | Admitting: Pulmonary Disease

## 2020-07-19 ENCOUNTER — Ambulatory Visit (INDEPENDENT_AMBULATORY_CARE_PROVIDER_SITE_OTHER): Payer: Medicare Other | Admitting: Osteopathic Medicine

## 2020-07-19 ENCOUNTER — Other Ambulatory Visit: Payer: Self-pay

## 2020-07-19 DIAGNOSIS — I480 Paroxysmal atrial fibrillation: Secondary | ICD-10-CM | POA: Diagnosis not present

## 2020-07-19 LAB — POCT INR: INR: 2.2 (ref 2.0–3.0)

## 2020-07-19 MED ORDER — LISINOPRIL 20 MG PO TABS: 40.0000 mg | ORAL_TABLET | Freq: Every day | ORAL | 0 refills | Status: DC

## 2020-07-25 ENCOUNTER — Other Ambulatory Visit: Payer: Self-pay | Admitting: Osteopathic Medicine

## 2020-08-16 ENCOUNTER — Ambulatory Visit: Payer: Medicare Other

## 2020-08-23 ENCOUNTER — Other Ambulatory Visit: Payer: Self-pay | Admitting: Cardiology

## 2020-08-23 ENCOUNTER — Ambulatory Visit: Payer: Medicare Other

## 2020-08-25 ENCOUNTER — Other Ambulatory Visit: Payer: Self-pay

## 2020-08-25 MED ORDER — IPRATROPIUM-ALBUTEROL 0.5-2.5 (3) MG/3ML IN SOLN
RESPIRATORY_TRACT | 11 refills | Status: DC
Start: 2020-08-25 — End: 2020-10-04

## 2020-08-31 ENCOUNTER — Ambulatory Visit (INDEPENDENT_AMBULATORY_CARE_PROVIDER_SITE_OTHER): Payer: Medicare Other | Admitting: Osteopathic Medicine

## 2020-08-31 ENCOUNTER — Other Ambulatory Visit: Payer: Self-pay

## 2020-08-31 DIAGNOSIS — I480 Paroxysmal atrial fibrillation: Secondary | ICD-10-CM | POA: Diagnosis not present

## 2020-08-31 LAB — POCT INR: INR: 2.3 (ref 2.0–3.0)

## 2020-08-31 MED ORDER — ALBUTEROL SULFATE HFA 108 (90 BASE) MCG/ACT IN AERS
1.0000 | INHALATION_SPRAY | Freq: Four times a day (QID) | RESPIRATORY_TRACT | 99 refills | Status: DC | PRN
Start: 2020-08-31 — End: 2022-07-03

## 2020-09-28 ENCOUNTER — Ambulatory Visit (INDEPENDENT_AMBULATORY_CARE_PROVIDER_SITE_OTHER): Payer: Medicare Other | Admitting: Osteopathic Medicine

## 2020-09-28 DIAGNOSIS — I480 Paroxysmal atrial fibrillation: Secondary | ICD-10-CM | POA: Diagnosis not present

## 2020-09-28 LAB — POCT INR: INR: 2.8 (ref 2.0–3.0)

## 2020-09-28 NOTE — Progress Notes (Signed)
Home BP better than here - known white coat htn  Continue current Coumadin recheck INR 1 month

## 2020-10-03 ENCOUNTER — Other Ambulatory Visit: Payer: Self-pay

## 2020-10-03 NOTE — Progress Notes (Signed)
Opened in error

## 2020-10-04 ENCOUNTER — Other Ambulatory Visit: Payer: Self-pay

## 2020-10-04 MED ORDER — IPRATROPIUM-ALBUTEROL 0.5-2.5 (3) MG/3ML IN SOLN
RESPIRATORY_TRACT | 11 refills | Status: DC
Start: 2020-10-04 — End: 2021-07-03

## 2020-10-10 ENCOUNTER — Telehealth: Payer: Self-pay | Admitting: General Practice

## 2020-10-11 NOTE — Telephone Encounter (Signed)
Patient scheduled.

## 2020-10-17 ENCOUNTER — Ambulatory Visit (INDEPENDENT_AMBULATORY_CARE_PROVIDER_SITE_OTHER): Payer: Medicare Other | Admitting: Family Medicine

## 2020-10-17 DIAGNOSIS — Z0000 Encounter for general adult medical examination without abnormal findings: Secondary | ICD-10-CM | POA: Diagnosis not present

## 2020-10-17 NOTE — Patient Instructions (Addendum)
MEDICARE ANNUAL WELLNESS VISIT Health Maintenance Summary and Written Plan of Care  Mr. Brillhart ,  Thank you for allowing me to perform your Medicare Annual Wellness Visit and for your ongoing commitment to your health.   Health Maintenance & Immunization History Health Maintenance  Topic Date Due  . COVID-19 Vaccine (1) 11/02/2020 (Originally 08/20/1953)  . Hepatitis C Screening  08/31/2021 (Originally 1941/05/17)  . TETANUS/TDAP  10/17/2021 (Originally 08/19/1960)  . INFLUENZA VACCINE  Discontinued  . PNA vac Low Risk Adult  Discontinued    There is no immunization history on file for this patient.  These are the patient goals that we discussed: Goals Addressed            This Visit's Progress   . Patient Stated       10/17/2020 AWV Goal: Tobacco Cessation  Smoking cessation instruction/counseling given:  counseled patient on the dangers of tobacco use, advised patient to stop smoking, and reviewed strategies to maximize success   Patient will verbalize understanding of the health risks associated with smoking/tobacco use  Lung cancer or lung disease, such as COPD  Heart disease.  Stroke.  Heart attack  Infertility  Osteoporosis and bone fractures. . Patient will create a plan to quit smoking/using tobacco  Pick a date to quit.   Write down the reasons why you are quitting and put it where you will see it often.  Identify the people, places, things, and activities that make you want to smoke (triggers) and avoid them. Make sure to take these actions: ? Throw away all cigarettes at home, at work, and in your car. ? Throw away smoking accessories, such as Set designer. ? Clean your car and make sure to empty the ashtray. ? Clean your home, including curtains and carpets.  Tell your family, friends, and coworkers that you are quitting. Support from your loved ones can make quitting easier.  Talk with your health care provider about your options for  quitting smoking.  Find out what treatment options are covered by your health insurance. . Patient will be able to demonstrate knowledge of tobacco cessation strategies that may maximize success  Quitting "cold [place]" is more successful than gradually quitting.  Attending in-person counseling to help you build problem-solving skills.   Finding resources and support systems that can help you to quit smoking and remain smoke-free after you quit. These resources are most helpful when you use them often. They can include: ? Online chats with a Veterinary surgeon. ? Telephone quitlines. ? Automotive engineer. ? Support groups or group counseling. ? Text messaging programs. ? Mobile phone applications.  Taking medicines to help you quit smoking: ? Nicotine patches, gum, or lozenges. ? Nicotine inhalers or sprays. ? Non-nicotine medicine that is taken by mouth. . Patient will note get discouraged if the process is difficult . Over the next year, patient will stop smoking or using other forms of tobacco  Smoking cessation instruction/counseling given:  commended patient for quitting and reviewed strategies for preventing relapses          This is a list of Health Maintenance Items that are overdue or due now: There are no preventive care reminders to display for this patient.   Orders/Referrals Placed Today: No orders of the defined types were placed in this encounter.  (Contact our referral department at [phone] if you have not spoken with someone about your referral appointment within the next 5 days)    Follow-up Plan . Follow-up with Lyn Hollingshead,  Natalie, DO as planned . Schedule your appointment for the tetanus, shingrix and covid vaccine. You are also due for a hepatitis screening.

## 2020-10-17 NOTE — Progress Notes (Signed)
MEDICARE ANNUAL WELLNESS VISIT  10/17/2020  Telephone Visit Disclaimer This Medicare AWV was conducted by telephone due to national recommendations for restrictions regarding the COVID-19 Pandemic (e.g. social distancing).  I verified, using two identifiers, that I am speaking with Christopher HamburgerJames R Reznick or their authorized healthcare agent. I discussed the limitations, risks, security, and privacy concerns of performing an evaluation and management service by telephone and the potential availability of an in-person appointment in the future. The patient expressed understanding and agreed to proceed.  Location of Patient: home Location of Provider (nurse):  In the office.  Subjective:    Christopher Armstrong is a 80 y.o. male patient of Sunnie Nielsenlexander, Natalie, DO who had a Medicare Annual Wellness Visit today via telephone. Fayrene FearingJames is Retired and lives alone. he has 1 children. he reports that he is socially active and does interact with friends/family regularly. he is minimally physically active and enjoys watching tv.  Patient Care Team: Sunnie NielsenAlexander, Natalie, DO as PCP - General (Osteopathic Medicine)  Advanced Directives 10/17/2020 05/25/2019  Does Patient Have a Medical Advance Directive? Yes No  Type of Advance Directive Healthcare Power of Attorney -  Does patient want to make changes to medical advance directive? No - Patient declined -  Copy of Healthcare Power of Attorney in Chart? No - copy requested -  Would patient like information on creating a medical advance directive? No - Patient declined No - Patient declined    Hospital Utilization Over the Past 12 Months: # of hospitalizations or ER visits: 1 # of surgeries: 0  Review of Systems    Patient reports that his overall health is worse compared to last year.  History obtained from chart review and the patient  Patient Reported Readings (BP, Pulse, CBG, Weight, etc) none  Pain Assessment Pain : No/denies pain     Current  Medications & Allergies (verified) Allergies as of 10/17/2020      Reactions   Clopidogrel Other (See Comments)   "Purple blood pockets clots in mouth" Blood blisters in roof of mouth, urinary retention   Ticagrelor Shortness Of Breath, Other (See Comments)   Atorvastatin Other (See Comments)   Simvastatin    Statins Nausea And Vomiting, Other (See Comments)      Medication List       Accurate as of October 17, 2020 10:28 AM. If you have any questions, ask your nurse or doctor.        albuterol 108 (90 Base) MCG/ACT inhaler Commonly known as: VENTOLIN HFA Inhale 1-2 puffs into the lungs every 6 (six) hours as needed for wheezing or shortness of breath.   aspirin EC 81 MG tablet Take 1 tablet (81 mg total) by mouth daily.   budesonide-formoterol 160-4.5 MCG/ACT inhaler Commonly known as: SYMBICORT Inhale 2 puffs into the lungs 2 (two) times daily.   carvedilol 12.5 MG tablet Commonly known as: COREG Take 1 tablet (12.5 mg total) by mouth 2 (two) times daily.   diltiazem 30 MG tablet Commonly known as: CARDIZEM TAKE 1 TABLET BY MOUTH AS NEEDED   doxycycline 100 MG tablet Commonly known as: VIBRA-TABS Take 1 tablet (100 mg total) by mouth 2 (two) times daily.   furosemide 40 MG tablet Commonly known as: LASIX Take 1 tablet by mouth once daily What changed:   how much to take  how to take this  when to take this   ipratropium-albuterol 0.5-2.5 (3) MG/3ML Soln Commonly known as: DUONEB USE 1 VIAL IN NEBULIZER 5  TIMES DAILY   lisinopril 20 MG tablet Commonly known as: ZESTRIL Take 2 tablets (40 mg total) by mouth daily.   magnesium hydroxide 400 MG/5ML suspension Commonly known as: MILK OF MAGNESIA Take 30 mLs by mouth as needed.   nitroGLYCERIN 0.4 MG SL tablet Commonly known as: NITROSTAT Place 1 tablet (0.4 mg total) under the tongue every 5 (five) minutes as needed for chest pain.   OXYGEN Inhale into the lungs daily.   warfarin 5 MG tablet Commonly  known as: COUMADIN Take as directed by the anticoagulation clinic. If you are unsure how to take this medication, talk to your nurse or doctor. Original instructions: TAKE 1 TO 2 TABLETS BY MOUTH  DAILY OR AS DIRECTED BY  THE  COUMADIN  CLINIC       History (reviewed): Past Medical History:  Diagnosis Date  . BPH (benign prostatic hyperplasia)   . CHF (congestive heart failure) ([place]) 06/23/2019  . COPD (chronic obstructive pulmonary disease) with chronic bronchitis ([place]) 08/26/2018  . Coronary artery disease of native artery of native heart with stable angina pectoris ([place]) 08/26/2018  . Essential hypertension 08/26/2018  . History of coronary artery stent placement 08/26/2018  . Mixed hyperlipidemia 08/26/2018  . 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 Tobacco use disorder, severe, dependence 08/26/2018   Past Surgical History:  Procedure Laterality Date  . BLADDER TUMOR EXCISION    . CORONARY ANGIOPLASTY WITH STENT PLACEMENT    . PROSTATE SURGERY    . TONSILLECTOMY     Family History  Problem Relation Age of Onset  . High blood pressure Mother   . Heart failure Mother   . Lung cancer Father   . High blood pressure Sister   . Fibromyalgia Sister    Social History   Socioeconomic History  . Marital status: Single    Spouse name: Not on file  . Number of children: 1  . Years of education: 20  . Highest education level: 12th grade  Occupational History  . Occupation: Architect    Comment: retired  Tobacco Use  . Smoking status: Current Every Day Smoker    Packs/day: 0.50    Years: 65.00    Pack years: 32.50  . Smokeless tobacco: Never Used  Vaping Use  . Vaping Use: Never used  Substance and Sexual Activity  . Alcohol use: Not Currently    Alcohol/week: 0.0 standard drinks    Comment: 0  . Drug use: Never  . Sexual activity: Not Currently    Partners: Female  Other Topics Concern  . Not on file  Social History Narrative   Watch TV   Social Determinants of Health    Financial Resource Strain: Low Risk   . Difficulty of Paying Living Expenses: Not hard at all  Food Insecurity: No Food Insecurity  . Worried About Charity fundraiser in the Last Year: Never true  . Ran Out of Food in the Last Year: Never true  Transportation Needs: No Transportation Needs  . Lack of Transportation (Medical): No  . Lack of Transportation (Non-Medical): No  Physical Activity: Inactive  . Days of Exercise per Week: 0 days  . Minutes of Exercise per Session: 0 min  Stress: No Stress Concern Present  . Feeling of Stress : Not at all  Social Connections: Socially Isolated  . Frequency of Communication with Friends and Family: Twice a week  . Frequency of Social Gatherings with Friends and Family: Never  . Attends Religious Services: Never  .  Active Member of Clubs or Organizations: No  . Attends Banker Meetings: Never  . Marital Status: Divorced    Activities of Daily Living In your present state of health, do you have any difficulty performing the following activities: 10/17/2020  Hearing? Y  Comment left ear- no hearing aid  Vision? N  Difficulty concentrating or making decisions? N  Walking or climbing stairs? N  Dressing or bathing? N  Doing errands, shopping? N  Preparing Food and eating ? N  Using the Toilet? N  In the past six months, have you accidently leaked urine? N  Do you have problems with loss of bowel control? N  Managing your Medications? N  Managing your Finances? N  Housekeeping or managing your Housekeeping? N  Some recent data might be hidden    Patient Education/ Literacy How often do you need to have someone help you when you read instructions, pamphlets, or other written materials from your doctor or pharmacy?: 1 - Never What is the last grade level you completed in school?: 12th grade  Exercise Current Exercise Habits: The patient does not participate in regular exercise at present, Exercise limited by: respiratory  conditions(s)  Diet Patient reports consuming 2 meals a day and 2 snack(s) a day Patient reports that his primary diet is: Regular Patient reports that she does have regular access to food.   Depression Screen PHQ 2/9 Scores 10/17/2020 10/17/2020 06/23/2019 05/25/2019 08/26/2018  PHQ - 2 Score 1 1 1  0 0  PHQ- 9 Score 1 1 3  - 1     Fall Risk Fall Risk  10/17/2020 04/26/2020 05/25/2019  Falls in the past year? 1 1 0  Number falls in past yr: 1 0 -  Injury with Fall? 0 1 -  Risk for fall due to : History of fall(s) Impaired mobility -  Follow up Falls evaluation completed Education provided Falls prevention discussed     Objective:  04/28/2020 seemed alert and oriented and he participated appropriately during our telephone visit.  Blood Pressure Weight BMI  BP Readings from Last 3 Encounters:  09/28/20 (!) 161/67  08/31/20 (!) 187/61  07/19/20 (!) 168/64   Wt Readings from Last 3 Encounters:  07/19/20 189 lb (85.7 kg)  06/21/20 190 lb (86.2 kg)  05/17/20 190 lb (86.2 kg)   BMI Readings from Last 1 Encounters:  07/19/20 28.74 kg/m    *Unable to obtain current vital signs, weight, and BMI due to telephone visit type  Hearing/Vision  . Medardo did  seem to have difficulty with hearing/understanding during the telephone conversation . Reports that he has not had a formal eye exam by an eye care professional within the past year . Reports that he has not had a formal hearing evaluation within the past year *Unable to fully assess hearing and vision during telephone visit type  Cognitive Function: 6CIT Screen 10/17/2020 05/25/2019  What Year? 0 points 0 points  What month? 0 points 0 points  What time? 0 points 0 points  Count back from 20 0 points 0 points  Months in reverse 0 points 0 points  Repeat phrase 0 points 2 points  Total Score 0 2   (Normal:0-7, Significant for Dysfunction: >8)  Normal Cognitive Function Screening: Yes   Immunization & Health Maintenance  Record  There is no immunization history on file for this patient.  Health Maintenance  Topic Date Due  . COVID-19 Vaccine (1) 11/02/2020 (Originally 08/20/1953)  . Hepatitis C Screening  08/31/2021 (Originally [birth year])  . TETANUS/TDAP  10/17/2021 (Originally 08/19/1960)  . INFLUENZA VACCINE  Discontinued  . PNA vac Low Risk Adult  Discontinued       Assessment  This is a routine wellness examination for [name].  Health Maintenance: Due or Overdue There are no preventive care reminders to display for this patient.  [name] does not need a referral for Community Assistance: Care Management:   not applicable Social Work:    not applicable Prescription Assistance:  not applicable Nutrition/Diabetes Education:  not applicable   Plan:  Personalized Goals Goals Addressed            This Visit's Progress   . Patient Stated       10/17/2020 AWV Goal: Tobacco Cessation  Smoking cessation instruction/counseling given:  counseled patient on the dangers of tobacco use, advised patient to stop smoking, and reviewed strategies to maximize success   Patient will verbalize understanding of the health risks associated with smoking/tobacco use  Lung cancer or lung disease, such as COPD  Heart disease.  Stroke.  Heart attack  Infertility  Osteoporosis and bone fractures. . Patient will create a plan to quit smoking/using tobacco  Pick a date to quit.   Write down the reasons why you are quitting and put it where you will see it often.  Identify the people, places, things, and activities that make you want to smoke (triggers) and avoid them. Make sure to take these actions: ? Throw away all cigarettes at home, at work, and in your car. ? Throw away smoking accessories, such as Scientist, research (medical). ? Clean your car and make sure to empty the ashtray. ? Clean your home, including curtains and carpets.  Tell your family, friends, and coworkers that you are  quitting. Support from your loved ones can make quitting easier.  Talk with your health care provider about your options for quitting smoking.  Find out what treatment options are covered by your health insurance. . Patient will be able to demonstrate knowledge of tobacco cessation strategies that may maximize success  Quitting "cold [place]" is more successful than gradually quitting.  Attending in-person counseling to help you build problem-solving skills.   Finding resources and support systems that can help you to quit smoking and remain smoke-free after you quit. These resources are most helpful when you use them often. They can include: ? Online chats with a Social worker. ? Telephone quitlines. ? Careers information officer. ? Support groups or group counseling. ? Text messaging programs. ? Mobile phone applications.  Taking medicines to help you quit smoking: ? Nicotine patches, gum, or lozenges. ? Nicotine inhalers or sprays. ? Non-nicotine medicine that is taken by mouth. . Patient will note get discouraged if the process is difficult . Over the next year, patient will stop smoking or using other forms of tobacco  Smoking cessation instruction/counseling given:  commended patient for quitting and reviewed strategies for preventing relapses        Personalized Health Maintenance & Screening Recommendations  Td vaccine, Covid vaccine and shingles vaccine  Lung Cancer Screening Recommended: yes (Low Dose CT Chest recommended if Age 83-80 years, 30 pack-year currently smoking OR have quit w/in past 15 years) Hepatitis C Screening recommended: yes HIV Screening recommended: yes  Advanced Directives: Written information was not prepared per patient's request.  Referrals & Orders No orders of the defined types were placed in this encounter.   Follow-up Plan . Follow-up with [name], DO  as planned . Schedule your appointment for the tetanus, shingrix and covid  vaccine. You are also due for a hepatitis screening. .    I have personally reviewed and noted the following in the patient's chart:   . Medical and social history . Use of alcohol, tobacco or illicit drugs  . Current medications and supplements . Functional ability and status . Nutritional status . Physical activity . Advanced directives . List of other physicians . Hospitalizations, surgeries, and ER visits in previous 12 months . Vitals . Screenings to include cognitive, depression, and falls . Referrals and appointments  In addition, I have reviewed and discussed with [name] certain preventive protocols, quality metrics, and best practice recommendations. A written personalized care plan for preventive services as well as general preventive health recommendations is available and can be mailed to the patient at his request.      Tinnie Gens  10/17/2020

## 2020-10-26 ENCOUNTER — Ambulatory Visit (INDEPENDENT_AMBULATORY_CARE_PROVIDER_SITE_OTHER): Payer: Medicare Other | Admitting: Osteopathic Medicine

## 2020-10-26 ENCOUNTER — Other Ambulatory Visit: Payer: Self-pay

## 2020-10-26 DIAGNOSIS — I480 Paroxysmal atrial fibrillation: Secondary | ICD-10-CM

## 2020-10-26 LAB — POCT INR: INR: 2.6 (ref 2.0–3.0)

## 2020-11-23 ENCOUNTER — Ambulatory Visit: Payer: Medicare Other | Admitting: Osteopathic Medicine

## 2020-11-24 ENCOUNTER — Ambulatory Visit: Payer: Medicare Other | Admitting: Osteopathic Medicine

## 2020-12-01 ENCOUNTER — Encounter: Payer: Self-pay | Admitting: Osteopathic Medicine

## 2020-12-01 ENCOUNTER — Ambulatory Visit (INDEPENDENT_AMBULATORY_CARE_PROVIDER_SITE_OTHER): Payer: Medicare Other | Admitting: Osteopathic Medicine

## 2020-12-01 ENCOUNTER — Other Ambulatory Visit: Payer: Self-pay

## 2020-12-01 VITALS — BP 200/71 | HR 92 | Temp 98.1°F | Resp 18 | Ht 68.0 in

## 2020-12-01 DIAGNOSIS — I480 Paroxysmal atrial fibrillation: Secondary | ICD-10-CM

## 2020-12-01 DIAGNOSIS — J449 Chronic obstructive pulmonary disease, unspecified: Secondary | ICD-10-CM | POA: Diagnosis not present

## 2020-12-01 LAB — POCT INR: INR: 2.8 (ref 2.0–3.0)

## 2020-12-01 MED ORDER — FUROSEMIDE 40 MG PO TABS
20.0000 mg | ORAL_TABLET | Freq: Every day | ORAL | 1 refills | Status: DC
Start: 2020-12-01 — End: 2021-11-15

## 2020-12-01 MED ORDER — GUAIFENESIN 200 MG PO TABS
200.0000 mg | ORAL_TABLET | Freq: Four times a day (QID) | ORAL | 1 refills | Status: DC | PRN
Start: 2020-12-01 — End: 2022-03-06

## 2020-12-01 MED ORDER — LISINOPRIL 40 MG PO TABS
40.0000 mg | ORAL_TABLET | Freq: Every day | ORAL | 1 refills | Status: DC
Start: 2020-12-01 — End: 2021-05-09

## 2020-12-01 NOTE — Progress Notes (Signed)
Christopher Armstrong is a 80 y.o. male who presents to  [facility] at [facility]  today, 12/01/20, seeking care for the following:  . Routine checkup chronic conditions o Following with pulmonary for COPD. Complaining of more mucus production/chest congestion. He is now on 2 L of oxygen 24/7. o Reduced dose of Lasix due to "feeling dry" but he has noted more lower extremity edema due to this. . Monitor Christopher Armstrong, at goal . He and sister have some questions about setting up assisted living/other arrangements if he gets to a point where he is no longer able to care for himself.     ASSESSMENT & PLAN with other pertinent findings:  The primary encounter diagnosis was COPD (chronic obstructive pulmonary disease) with chronic bronchitis (Christopher Armstrong). A diagnosis of Paroxysmal atrial fibrillation (Christopher Armstrong) was also pertinent to this visit.    Christopher Armstrong  If you want to ask the Christopher Armstrong about assisted living, or anything else through them, will need to establish with the Christopher Armstrong  Can maybe get a nurse to come to the house to check Christopher Armstrong levels monthly through home health  If interested in assisted living / nursing home, once you decide on a facility they should reach out to me w/ the paperwork   For Lasix, can take 20 mg most days, or can take 40 mg as needed for increased swelling.         Orders Placed This Encounter  Procedures  . POCT Christopher Armstrong    Meds ordered this encounter  Medications  . guaiFENesin 200 MG tablet    Sig: Take 1-2 tablets (200-400 mg total) by mouth every 6 (six) hours as needed for cough or to loosen phlegm.    Dispense:  90 tablet    Refill:  1  . lisinopril (ZESTRIL) 40 MG tablet    Sig: Take 1 tablet (40 mg total) by mouth daily.    Dispense:  90 tablet    Refill:  1    Cancel 20 mg dose, thanks  . furosemide (LASIX) 40 MG tablet    Sig: Take 0.5-1 tablets (20-40 mg total) by mouth daily.    Dispense:  90 tablet    Refill:  1      See below for relevant physical exam findings  See below for recent lab and imaging results reviewed  Medications, allergies, PMH, PSH, SocH, FamH reviewed below    Follow-up Armstrong: Return in about 4 weeks (around 12/29/2020) for nurse visit Christopher Armstrong, check-up w/ Christopher Armstrong in 4 months monitor COPD .                                        Exam:  BP (!) 200/71 (BP Location: Left Arm, Cuff Size: Normal)   Pulse 92   Temp 98.1 F (36.7 C) (Oral)   Resp 18   Ht 5\' 8"  (1.727 m)   SpO2 96%   BMI 28.74 kg/m   Constitutional: VS see above. General Appearance: alert, well-developed, well-nourished, NAD  Neck: No masses, trachea midline.   Respiratory: Normal respiratory effort. Diffuse wheeze/rhonchi and diminished breath sounds bilaterally consistent with previous exams  Cardiovascular: S1/S2 normal, irregularly irregular rhythm, heart sounds are faint.  Musculoskeletal: Gait normal. Symmetric and independent movement of all extremities  Neurological: Normal balance/coordination. No tremor.  Skin: warm, dry, intact.   Psychiatric: Normal judgment/insight. Normal mood  and affect. Oriented x3.   Current Meds  Medication Sig  . albuterol (VENTOLIN HFA) 108 (90 Base) MCG/ACT inhaler Inhale 1-2 puffs into the lungs every 6 (six) hours as needed for wheezing or shortness of breath.  Marland Kitchen aspirin EC 81 MG tablet Take 1 tablet (81 mg total) by mouth daily.  . budesonide-formoterol (SYMBICORT) 160-4.5 MCG/ACT inhaler Inhale 2 puffs into the lungs 2 (two) times daily.  . carvedilol (COREG) 12.5 MG tablet Take 1 tablet (12.5 mg total) by mouth 2 (two) times daily.  Marland Kitchen diltiazem (CARDIZEM) 30 MG tablet TAKE 1 TABLET BY MOUTH AS NEEDED  . guaiFENesin 200 MG tablet Take 1-2 tablets (200-400 mg total) by mouth every 6 (six) hours as needed for cough or to loosen phlegm.  Marland Kitchen ipratropium-albuterol (DUONEB) 0.5-2.5 (3) MG/3ML SOLN USE 1 VIAL IN NEBULIZER 5  TIMES DAILY  . magnesium hydroxide (MILK OF MAGNESIA) 400 MG/5ML suspension Take 30 mLs by mouth as needed.  . nitroGLYCERIN (NITROSTAT) 0.4 MG SL tablet Place 1 tablet (0.4 mg total) under the tongue every 5 (five) minutes as needed for chest pain.  . OXYGEN Inhale into the lungs daily.  Marland Kitchen warfarin (COUMADIN) 5 MG tablet TAKE 1 TO 2 TABLETS BY MOUTH  DAILY OR AS DIRECTED BY  THE  COUMADIN  CLINIC  . [DISCONTINUED] furosemide (LASIX) 40 MG tablet Take 1 tablet by mouth once daily (Christopher taking differently: 20 mg.)  . [DISCONTINUED] lisinopril (ZESTRIL) 20 MG tablet Take 2 tablets (40 mg total) by mouth daily.    Allergies  Allergen Reactions  . Clopidogrel Other (See Comments)    "Purple blood pockets clots in mouth" Blood blisters in roof of mouth, urinary retention   . Ticagrelor Shortness Of Breath and Other (See Comments)  . Atorvastatin Other (See Comments)  . Simvastatin   . Statins Nausea And Vomiting and Other (See Comments)    Christopher Active Problem List   Diagnosis Date Noted  . DNR no code (do not resuscitate) 05/17/2020  . Dizziness 04/26/2020  . Shortness of breath 11/05/2019  . Hyperglycemia 11/04/2019  . Community acquired pneumonia of right lower lobe of lung 11/04/2019  . Bradycardia 11/04/2019  . Bilateral lower extremity edema 11/04/2019  . CHF (congestive heart failure) ([place]) 06/23/2019  . COPD (chronic obstructive pulmonary disease) with chronic bronchitis ([place]) 08/26/2018  . Coronary artery disease of native artery of native heart with stable angina pectoris ([place]) 08/26/2018  . Mixed hyperlipidemia 08/26/2018  . Essential hypertension 08/26/2018  . 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 Tobacco use disorder, severe, dependence 08/26/2018  . History of coronary artery stent placement 08/26/2018  . White coat syndrome with hypertension 08/26/2018  . Eyelid abnormality 08/26/2018  . Lung nodule 08/26/2018  . Abnormal findings on diagnostic imaging of lung  08/26/2018    Family History  Problem Relation Age of Onset  . High blood pressure Mother   . Heart failure Mother   . Lung cancer Father   . High blood pressure Sister   . Fibromyalgia Sister     Social History   Tobacco Use  Smoking Status Current Every Day Smoker  . Packs/day: 0.50  . Years: 65.00  . Pack years: 32.50  Smokeless Tobacco Never Used    Past Surgical History:  Procedure Laterality Date  . BLADDER TUMOR EXCISION    . CORONARY ANGIOPLASTY WITH STENT PLACEMENT    . PROSTATE SURGERY    . TONSILLECTOMY       There is no immunization  history on file for this Christopher.  Recent Results (from the past 2160 hour(s))  POCT Christopher Armstrong     Status: None   Collection Time: 09/28/20 10:03 AM  Result Value Ref Range   Christopher Armstrong 2.8 2.0 - 3.0  POCT Christopher Armstrong     Status: None   Collection Time: 10/26/20 10:08 AM  Result Value Ref Range   Christopher Armstrong 2.6 2.0 - 3.0  POCT Christopher Armstrong     Status: None   Collection Time: 12/01/20 11:05 AM  Result Value Ref Range   Christopher Armstrong 2.8 2.0 - 3.0    No results found.     All questions at time of visit were answered - Christopher instructed to contact office with any additional concerns or updates. ER/RTC precautions were reviewed with the Christopher as applicable.   Please note: manual typing as well as voice recognition software may have been used to produce this document - typos may escape review. Please contact Christopher. Sheppard Armstrong for any needed clarifications.

## 2020-12-01 NOTE — Patient Instructions (Signed)
If you want to ask the [facility] about assisted living, or anything else through them, [facility]  Can maybe get a nurse to come to the house to check INR levels monthly through home health  If interested in assisted living / nursing home, once you decide on a facility they should reach out to me w/ the paperwork   For Lasix, can take 20 mg most days, or can take 40 mg as needed for increased swelling.

## 2020-12-04 ENCOUNTER — Other Ambulatory Visit: Payer: Self-pay | Admitting: Adult Health

## 2020-12-14 DIAGNOSIS — Z7951 Long term (current) use of inhaled steroids: Secondary | ICD-10-CM | POA: Diagnosis not present

## 2020-12-14 DIAGNOSIS — I480 Paroxysmal atrial fibrillation: Secondary | ICD-10-CM | POA: Diagnosis not present

## 2020-12-14 DIAGNOSIS — Z9981 Dependence on supplemental oxygen: Secondary | ICD-10-CM | POA: Diagnosis not present

## 2020-12-14 DIAGNOSIS — Z7901 Long term (current) use of anticoagulants: Secondary | ICD-10-CM | POA: Diagnosis not present

## 2020-12-14 DIAGNOSIS — Z5181 Encounter for therapeutic drug level monitoring: Secondary | ICD-10-CM | POA: Diagnosis not present

## 2020-12-14 DIAGNOSIS — J449 Chronic obstructive pulmonary disease, unspecified: Secondary | ICD-10-CM | POA: Diagnosis not present

## 2020-12-14 DIAGNOSIS — I110 Hypertensive heart disease with heart failure: Secondary | ICD-10-CM | POA: Diagnosis not present

## 2020-12-14 DIAGNOSIS — E782 Mixed hyperlipidemia: Secondary | ICD-10-CM | POA: Diagnosis not present

## 2020-12-14 DIAGNOSIS — I509 Heart failure, unspecified: Secondary | ICD-10-CM | POA: Diagnosis not present

## 2020-12-14 DIAGNOSIS — R911 Solitary pulmonary nodule: Secondary | ICD-10-CM | POA: Diagnosis not present

## 2020-12-14 DIAGNOSIS — I25118 Atherosclerotic heart disease of native coronary artery with other forms of angina pectoris: Secondary | ICD-10-CM | POA: Diagnosis not present

## 2020-12-14 DIAGNOSIS — Z8701 Personal history of pneumonia (recurrent): Secondary | ICD-10-CM | POA: Diagnosis not present

## 2020-12-14 DIAGNOSIS — F17210 Nicotine dependence, cigarettes, uncomplicated: Secondary | ICD-10-CM | POA: Diagnosis not present

## 2020-12-15 ENCOUNTER — Telehealth: Payer: Self-pay

## 2020-12-15 NOTE — Telephone Encounter (Signed)
RN Gerald Stabs called stating that patient completed the home health evaluation. She mentioned a verbal order to continue home nursing visit. She is also requesting a referral for a medical social worker for community resources. Left a detailed vm msg for RN. Informed to proceed with current orders for patient. Direct call back info provided.

## 2020-12-29 ENCOUNTER — Ambulatory Visit: Payer: Medicare Other

## 2020-12-29 ENCOUNTER — Telehealth: Payer: Self-pay

## 2020-12-29 NOTE — Telephone Encounter (Signed)
Levada Dy from [facility] called with pt's INR/PT results:  INR: 2.8 PT: 33.1  Per RN, patient could use some additional education regarding medications. Pt is not taking mucinex for congestion and is still smoking. Levada Dy mentioned that pt could benefit from extended home nursing, however it can't be exclusively for INR checks. Pls advise, thanks.

## 2020-12-29 NOTE — Telephone Encounter (Signed)
Can give verbal order for home health for medication management, change / decline in patient status, continue INR checks. INR next due in 1 month

## 2020-12-30 NOTE — Telephone Encounter (Signed)
Christopher Armstrong with Home Health advised of recommendations.

## 2021-01-13 DIAGNOSIS — I110 Hypertensive heart disease with heart failure: Secondary | ICD-10-CM | POA: Diagnosis not present

## 2021-01-13 DIAGNOSIS — Z5181 Encounter for therapeutic drug level monitoring: Secondary | ICD-10-CM | POA: Diagnosis not present

## 2021-01-13 DIAGNOSIS — J449 Chronic obstructive pulmonary disease, unspecified: Secondary | ICD-10-CM | POA: Diagnosis not present

## 2021-01-13 DIAGNOSIS — I25118 Atherosclerotic heart disease of native coronary artery with other forms of angina pectoris: Secondary | ICD-10-CM | POA: Diagnosis not present

## 2021-01-13 DIAGNOSIS — F17210 Nicotine dependence, cigarettes, uncomplicated: Secondary | ICD-10-CM | POA: Diagnosis not present

## 2021-01-13 DIAGNOSIS — Z8701 Personal history of pneumonia (recurrent): Secondary | ICD-10-CM | POA: Diagnosis not present

## 2021-01-13 DIAGNOSIS — Z7951 Long term (current) use of inhaled steroids: Secondary | ICD-10-CM | POA: Diagnosis not present

## 2021-01-13 DIAGNOSIS — I480 Paroxysmal atrial fibrillation: Secondary | ICD-10-CM | POA: Diagnosis not present

## 2021-01-13 DIAGNOSIS — Z9981 Dependence on supplemental oxygen: Secondary | ICD-10-CM | POA: Diagnosis not present

## 2021-01-13 DIAGNOSIS — I509 Heart failure, unspecified: Secondary | ICD-10-CM | POA: Diagnosis not present

## 2021-01-13 DIAGNOSIS — Z7901 Long term (current) use of anticoagulants: Secondary | ICD-10-CM | POA: Diagnosis not present

## 2021-01-13 DIAGNOSIS — R911 Solitary pulmonary nodule: Secondary | ICD-10-CM | POA: Diagnosis not present

## 2021-01-13 DIAGNOSIS — E782 Mixed hyperlipidemia: Secondary | ICD-10-CM | POA: Diagnosis not present

## 2021-01-16 DIAGNOSIS — J42 Unspecified chronic bronchitis: Secondary | ICD-10-CM | POA: Diagnosis not present

## 2021-01-30 ENCOUNTER — Telehealth: Payer: Self-pay

## 2021-01-30 NOTE — Telephone Encounter (Signed)
Christopher Armstrong from [facility] called with pt's INR/PT results:  INR: 2.6 PT: 31.1  RN is requesting to continue at home INR testing. Pt is taking his medication as directed from last visit. Should patient continue to current warfarin therapy?

## 2021-01-30 NOTE — Telephone Encounter (Signed)
OK to continue current therapy and home INR check again in 1 month

## 2021-02-01 DIAGNOSIS — F17200 Nicotine dependence, unspecified, uncomplicated: Secondary | ICD-10-CM | POA: Insufficient documentation

## 2021-02-01 DIAGNOSIS — N400 Enlarged prostate without lower urinary tract symptoms: Secondary | ICD-10-CM | POA: Insufficient documentation

## 2021-02-01 NOTE — Telephone Encounter (Addendum)
Task completed. RN updated of provider's order. Levada Dy requested for provider to complete a MDINR form. The enrollment form is to see if patient is eligible for home INR/Device. Faxed to [phone], confirmation rec'd.   RN wanted provider to be aware that pt claims that Symbicort inhaler does not work well. Pt continues to take the medication as directed.

## 2021-02-09 DIAGNOSIS — J42 Unspecified chronic bronchitis: Secondary | ICD-10-CM | POA: Diagnosis not present

## 2021-02-12 DIAGNOSIS — Z9981 Dependence on supplemental oxygen: Secondary | ICD-10-CM | POA: Diagnosis not present

## 2021-02-12 DIAGNOSIS — Z7901 Long term (current) use of anticoagulants: Secondary | ICD-10-CM | POA: Diagnosis not present

## 2021-02-12 DIAGNOSIS — R911 Solitary pulmonary nodule: Secondary | ICD-10-CM | POA: Diagnosis not present

## 2021-02-12 DIAGNOSIS — I25118 Atherosclerotic heart disease of native coronary artery with other forms of angina pectoris: Secondary | ICD-10-CM | POA: Diagnosis not present

## 2021-02-12 DIAGNOSIS — Z5181 Encounter for therapeutic drug level monitoring: Secondary | ICD-10-CM | POA: Diagnosis not present

## 2021-02-12 DIAGNOSIS — Z7951 Long term (current) use of inhaled steroids: Secondary | ICD-10-CM | POA: Diagnosis not present

## 2021-02-12 DIAGNOSIS — Z7982 Long term (current) use of aspirin: Secondary | ICD-10-CM | POA: Diagnosis not present

## 2021-02-12 DIAGNOSIS — I480 Paroxysmal atrial fibrillation: Secondary | ICD-10-CM | POA: Diagnosis not present

## 2021-02-12 DIAGNOSIS — F17210 Nicotine dependence, cigarettes, uncomplicated: Secondary | ICD-10-CM | POA: Diagnosis not present

## 2021-02-12 DIAGNOSIS — I509 Heart failure, unspecified: Secondary | ICD-10-CM | POA: Diagnosis not present

## 2021-02-12 DIAGNOSIS — I110 Hypertensive heart disease with heart failure: Secondary | ICD-10-CM | POA: Diagnosis not present

## 2021-02-12 DIAGNOSIS — J449 Chronic obstructive pulmonary disease, unspecified: Secondary | ICD-10-CM | POA: Diagnosis not present

## 2021-02-12 DIAGNOSIS — E782 Mixed hyperlipidemia: Secondary | ICD-10-CM | POA: Diagnosis not present

## 2021-02-26 ENCOUNTER — Other Ambulatory Visit: Payer: Self-pay | Admitting: Adult Health

## 2021-03-01 ENCOUNTER — Other Ambulatory Visit: Payer: Self-pay

## 2021-03-01 ENCOUNTER — Ambulatory Visit (INDEPENDENT_AMBULATORY_CARE_PROVIDER_SITE_OTHER): Payer: Medicare (Managed Care) | Admitting: Pharmacist

## 2021-03-01 ENCOUNTER — Telehealth: Payer: Self-pay

## 2021-03-01 DIAGNOSIS — Z0289 Encounter for other administrative examinations: Secondary | ICD-10-CM | POA: Diagnosis not present

## 2021-03-01 MED ORDER — WARFARIN SODIUM 5 MG PO TABS: 5.0000 mg | ORAL_TABLET | Freq: Every day | ORAL | 1 refills | Status: DC

## 2021-03-01 NOTE — Telephone Encounter (Signed)
He doesn't see the Cardiologist because we do the INR/Warfrin. Cardiology no longer manages his INR.

## 2021-03-01 NOTE — Telephone Encounter (Signed)
Christopher Armstrong called and states cardiology with no longer fill his warfarin prescription. He has his INR checked with [place]. He is in need of a refill. Pended refill.

## 2021-03-01 NOTE — Telephone Encounter (Signed)
[name] called and states he dropped off paperwork from [employer] for his Symbicort. He states there is a deadline to get the paperwork finished and sent in.

## 2021-03-01 NOTE — Progress Notes (Signed)
Care Management   Pharmacy Note  03/01/2021 Name: Christopher Armstrong MRN: [id number] DOB: July 02, 1941  Subjective: Christopher Armstrong is a 80 y.o. year old male who is a primary care patient of [name], DO. The Care Management team was consulted for assistance with care management and care coordination needs.    Collaboration with PCP & clinic staff for medication access [Symbicort PAP] in response to provider referral for pharmacy case management and/or care coordination services.   Assessment:  Review of patient status, including review of consultants reports, laboratory and other test data, was performed as part of comprehensive evaluation and provision of chronic care management services.   SDOH (Social Determinants of Health) assessments and interventions performed:    Objective:  Lab Results  Component Value Date   CREATININE 0.90 05/09/2020   CREATININE 1.00 04/26/2020   CREATININE 1.14 07/14/2019     BP Readings from Last 3 Encounters:  12/01/20 (!) 200/71  10/26/20 (!) 184/62  09/28/20 (!) 161/67    Care Plan  Allergies  Allergen Reactions  . Clopidogrel Other (See Comments)    "Purple blood pockets clots in mouth" Blood blisters in roof of mouth, urinary retention   . Ticagrelor Shortness Of Breath and Other (See Comments)  . Atorvastatin Other (See Comments)  . Simvastatin   . Statins Nausea And Vomiting and Other (See Comments)  . Finasteride Other (See Comments)  . Terazosin     Other reaction(s): Orthostatic hypotension    Medications Reviewed Today    Reviewed by Ninfa Meeker, CMA (Certified Medical Assistant) on 12/01/20 at 1032  Med List Status: <None>  Medication Order Taking? Sig Documenting Provider Last Dose Status Informant  albuterol (VENTOLIN HFA) 108 (90 Base) MCG/ACT inhaler [id number] Yes Inhale 1-2 puffs into the lungs every 6 (six) hours as needed for wheezing or shortness of breath. [name], DO Taking  Active   aspirin EC 81 MG tablet [id number] Yes Take 1 tablet (81 mg total) by mouth daily. Lelon Perla, MD Taking Active   budesonide-formoterol [facility]) 160-4.5 MCG/ACT inhaler [id number] Yes Inhale 2 puffs into the lungs 2 (two) times daily. [name], DO Taking Active   carvedilol (COREG) 12.5 MG tablet [id number] Yes Take 1 tablet (12.5 mg total) by mouth 2 (two) times daily. [name], DO Taking Active   diltiazem (CARDIZEM) 30 MG tablet [id number] Yes TAKE 1 TABLET BY MOUTH AS NEEDED Lendon Colonel, NP Taking Active   doxycycline (VIBRA-TABS) 100 MG tablet [id number] Yes Take 1 tablet (100 mg total) by mouth 2 (two) times daily. [name], DO Taking Active   furosemide (LASIX) 40 MG tablet [id number] Yes Take 1 tablet by mouth once daily  Patient taking differently: 20 mg.   [name], DO Taking Active   ipratropium-albuterol (DUONEB) 0.5-2.5 (3) MG/3ML Bailey Mech [id number] Yes USE 1 VIAL IN NEBULIZER 5 TIMES DAILY [name], DO Taking Active   lisinopril (ZESTRIL) 20 MG tablet [id number] Yes Take 2 tablets (40 mg total) by mouth daily. [name], DO Taking Active   magnesium hydroxide (MILK OF MAGNESIA) 400 MG/5ML suspension [id number] Yes Take 30 mLs by mouth as needed. [provider] Taking Active   nitroGLYCERIN (NITROSTAT) 0.4 MG SL tablet [id number] Yes Place 1 tablet (0.4 mg total) under the tongue every 5 (five) minutes as needed for chest pain. [name], DO Taking Active   OXYGEN [id number] Yes Inhale into the lungs daily. [provider] Taking Active  Med Note Richardson Landry, Linton Rump   Wed Mar 02, 2020 10:30 AM)    warfarin (COUMADIN) 5 MG tablet [id number] Yes TAKE 1 TO 2 TABLETS BY MOUTH  DAILY OR AS DIRECTED BY  THE  COUMADIN  CLINIC Lendon Colonel, NP Taking Active           Patient Active Problem List   Diagnosis Date Noted  . Hypertrophy of prostate without urinary obstruction and  other lower urinary tract symptoms (LUTS) 02/01/2021  . Tobacco use disorder 02/01/2021  . DNR no code (do not resuscitate) 05/17/2020  . Dizziness 04/26/2020  . Shortness of breath 11/05/2019  . Hyperglycemia 11/04/2019  . Community acquired pneumonia of right lower lobe of lung 11/04/2019  . Bradycardia 11/04/2019  . Bilateral lower extremity edema 11/04/2019  . CHF (congestive heart failure) ([place]) 06/23/2019  . COPD (chronic obstructive pulmonary disease) with chronic bronchitis ([place]) 08/26/2018  . Coronary artery disease of native artery of native heart with stable angina pectoris ([place]) 08/26/2018  . Mixed hyperlipidemia 08/26/2018  . Essential hypertension 08/26/2018  . 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 Tobacco use disorder, severe, dependence 08/26/2018  . History of coronary artery stent placement 08/26/2018  . White coat syndrome with hypertension 08/26/2018  . Eyelid abnormality 08/26/2018  . Lung nodule 08/26/2018  . Abnormal findings on diagnostic imaging of lung 08/26/2018    Conditions to be addressed/monitored: HTN and COPD  There are no care plans that you recently modified to display for this patient.  HTN: Patient is eligible for chronic care management services provided by pharmacist, under Dr. Sheppard Coil. Will coordinate enrollment.  Medication Assistance:  Application for [employer]  medication assistance program. in process.  Anticipated assistance start date 03/08/21.  See plan of care for additional detail.   Follow Up: completed PAP, obtained provider signature, paperwork to be faxed to [facility] for review. Patient requests original copies.   Plan: The care management team will reach out to the patient again over the next 7-10 days.  Darius Bump, [facility]

## 2021-03-01 NOTE — Telephone Encounter (Signed)
No idea why cardiology would refuse this refill unless he's due for a visit with them... can we clarify the issue? Please set him up for INR w/ clinical pharmacist

## 2021-03-01 NOTE — Telephone Encounter (Signed)
Task completed. Pt has been updated of completed application. Application faxed to [phone], confirmation received.   Pt also inquire about the med refill for warfarin during the call. As per cardiologist office, no longer managing patient's refills for the medication. Pt currently has one week left of the rx available. Pt informed to make an appt with clinical pharmacist, but patient is homebound (INR are done by [facility]). Pls advise, thanks.

## 2021-03-01 NOTE — Telephone Encounter (Signed)
Refill request received for warfarin but we at [facility] no longer manage I will route to natalie alexander DO

## 2021-03-01 NOTE — Telephone Encounter (Signed)
Task completed. Pt has been updated regarding med refill and provider's recommendation. No other inquiries during the call.   Side note - copy of application placed in scan folder.

## 2021-03-01 NOTE — Telephone Encounter (Signed)
Refills sent INR can continue w/ home health and sent to [place] attn: clinical pharmacist or covering provider

## 2021-03-02 ENCOUNTER — Telehealth: Payer: Self-pay

## 2021-03-02 ENCOUNTER — Ambulatory Visit (INDEPENDENT_AMBULATORY_CARE_PROVIDER_SITE_OTHER): Payer: Medicare (Managed Care) | Admitting: Pharmacist

## 2021-03-02 DIAGNOSIS — I480 Paroxysmal atrial fibrillation: Secondary | ICD-10-CM

## 2021-03-02 LAB — POCT INR: INR: 2.8 (ref 2–3)

## 2021-03-02 NOTE — Telephone Encounter (Signed)
As with all breathing concerns, needs visit or needs to seek ER/UC

## 2021-03-02 NOTE — Telephone Encounter (Signed)
Levada Dy from [place] called and states Christopher Armstrong is having increased shortness of breath. She states he is taking longer to recover after breathing treatments. He does have clear sputum with coughing. Please advise.

## 2021-03-02 NOTE — Telephone Encounter (Signed)
INR 2.8  PT 33.7  Christopher Armstrong from [place] reports his is taking 5 mg tablet daily.   He is wanting a INR at home monitoring.

## 2021-03-03 NOTE — Telephone Encounter (Signed)
Levada Dy from [facility] notified of provider instructions.

## 2021-03-03 NOTE — Progress Notes (Signed)
Subjective:    Christopher Armstrong is a 80 y.o. male here for follow-up of chronic anticoagulation for atrial fibrillation. Bleeding signs/symptoms:  None Thromboembolic signs/symptoms:  None  Missed Coumadin doses: None Medication changes: no Dietary changes: no Bacterial/viral infection: no Other concerns: no   Objective:    Current warfarin (COUMADIN) dose: warfarin 5mg  daily Lab Results  Component Value Date   INR 2.8 03/02/2021   INR 2.8 12/01/2020   INR 2.6 10/26/2020   INR 1.7 (H) 05/09/2020      Assessment:    Therapeutic INR for goal of 2-3    Plan:    Dose: no change Next INR: 1 month     Of note, [employer] paperwork for Symbicort Patient Assistance has been faxed, hard copy available at clinic as patient wished to keep for personal records.

## 2021-03-03 NOTE — Patient Instructions (Signed)
No change! Continue Warfarin 5mg  daily Repeat INR in 1 month, ~03/30/21 with home health.

## 2021-03-07 DIAGNOSIS — J42 Unspecified chronic bronchitis: Secondary | ICD-10-CM | POA: Diagnosis not present

## 2021-03-09 IMAGING — CT CT HEAD W/O CM
3 of 4 series · 16 of 47 positions shown, 19 images · non-contrast
Comparison: None.

CLINICAL DATA: Dizziness. Syncopal episode 11 days ago. Subsequent
vomiting.

EXAM:
CT HEAD WITHOUT CONTRAST
TECHNIQUE: Contiguous axial images were obtained from the base of the skull
through the vertex without intravenous contrast.

[Series 2: head wo · axial · 0.46mm/px · z∈[-111,+19]mm · 10 of 32 slices shown, 13 images]
[im 3/32  brain]
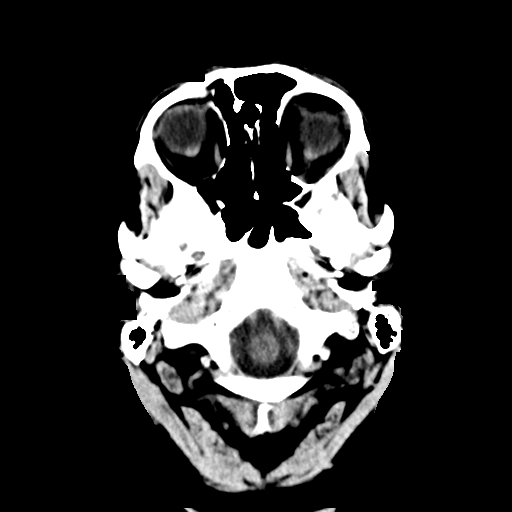
[im 3/32  bone]
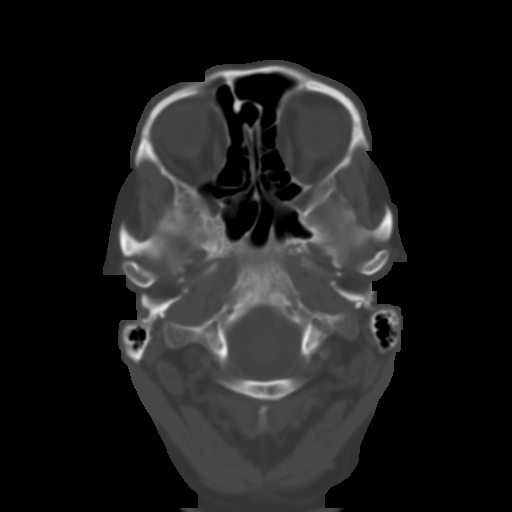
[im 5/32  brain]
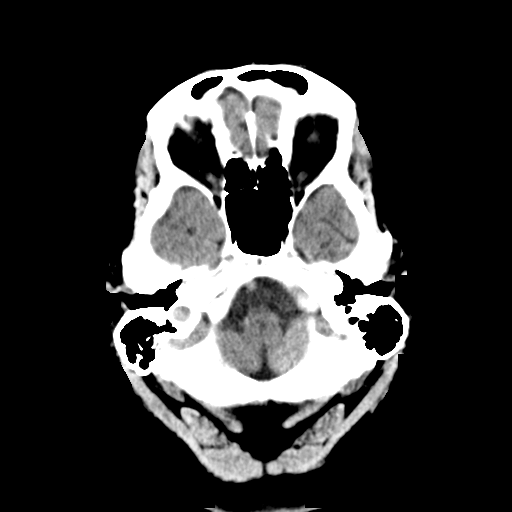
[im 9/32  brain]
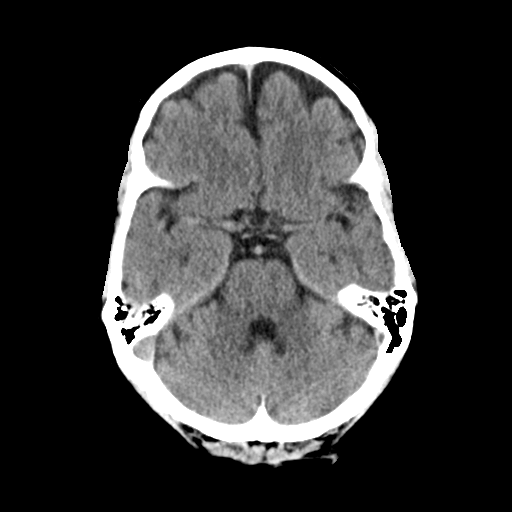
[im 12/32  brain]
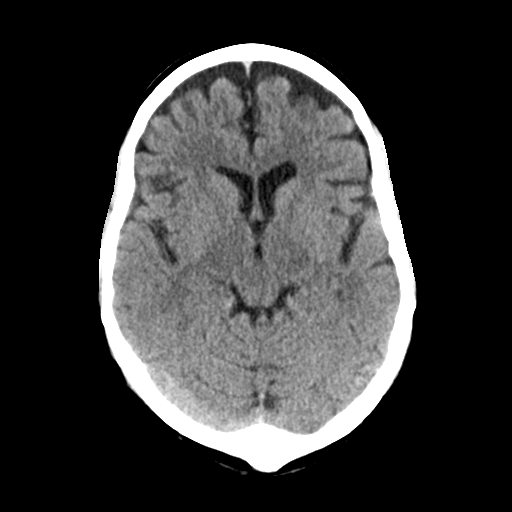
[im 14/32  brain]
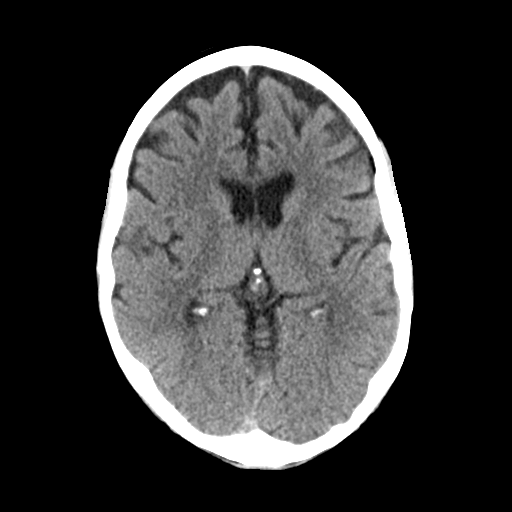
[im 14/32  bone]
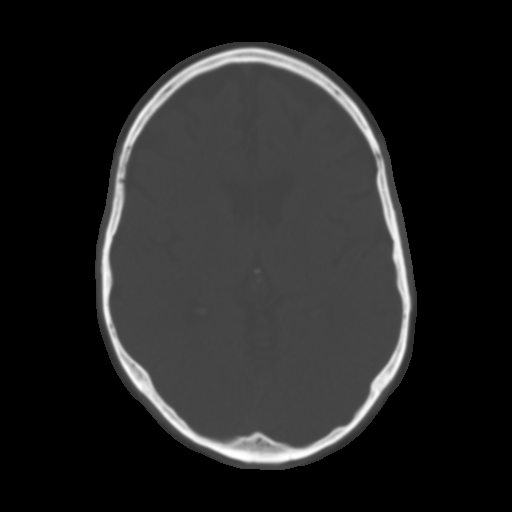
[im 18/32  brain]
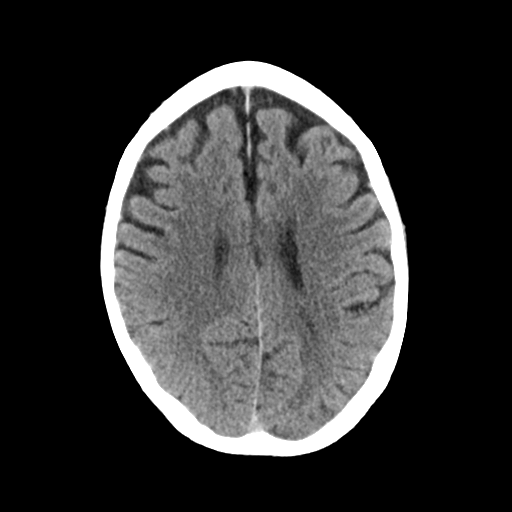
[im 20/32  brain]
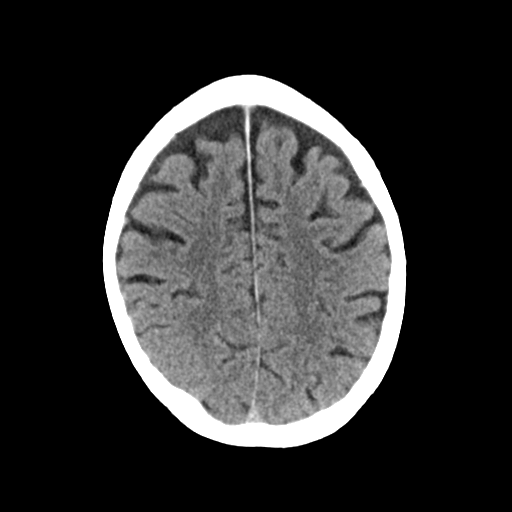
[im 23/32  brain]
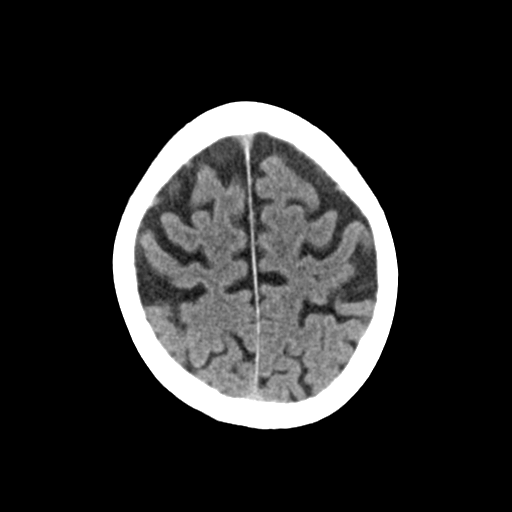
[im 27/32  brain]
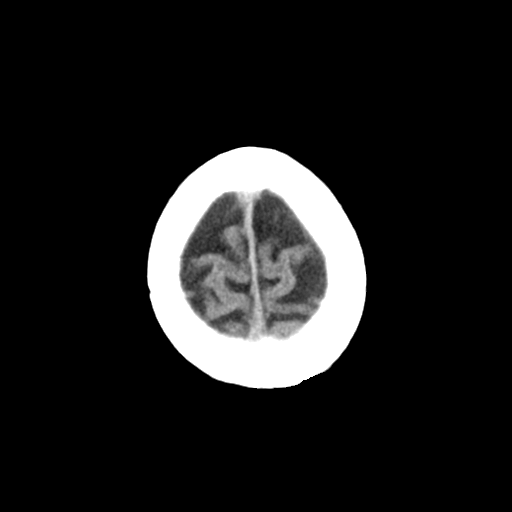
[im 27/32  bone]
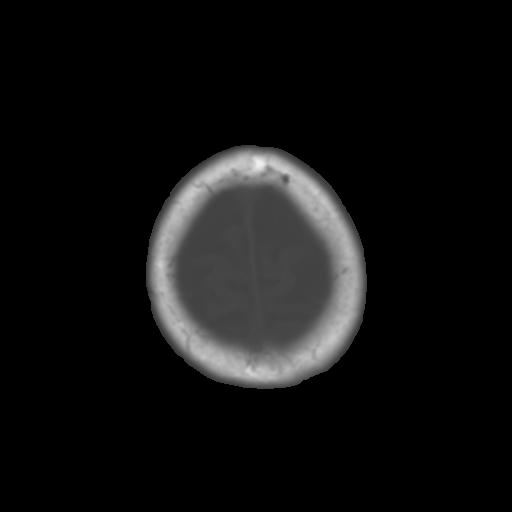
[im 29/32  brain]
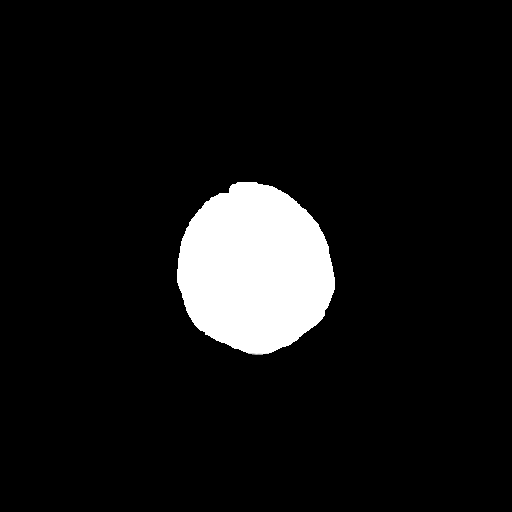

[Series 4: head wo coronal · coronal · 0.33mm/px · 3 of 73 slices shown]
[im 25/73  brain]
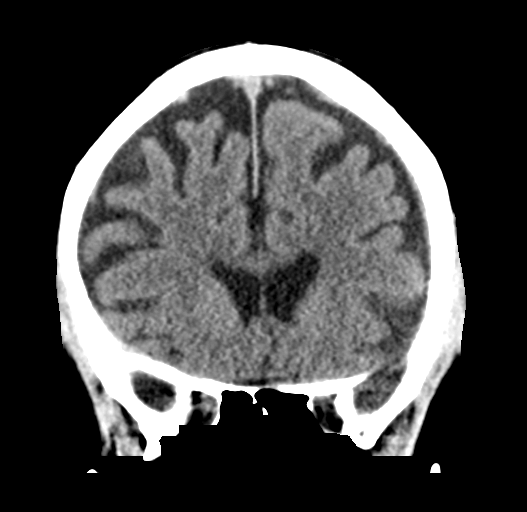
[im 33/73  brain]
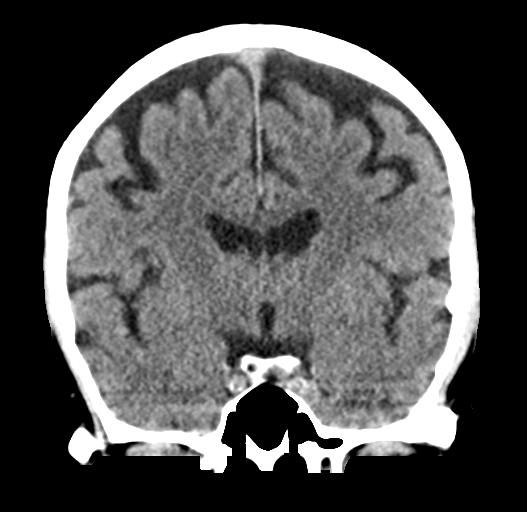
[im 41/73  brain]
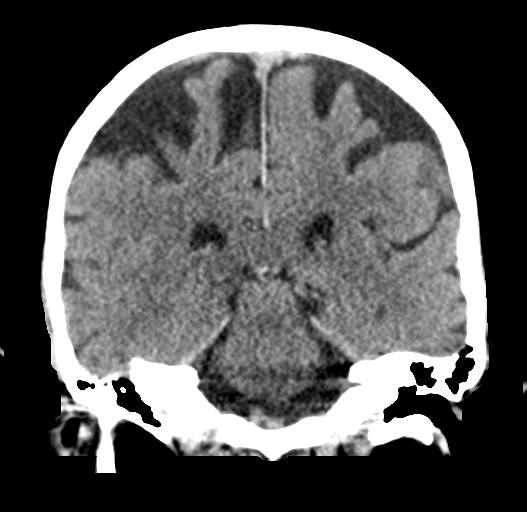

[Series 5: head wo sagittal · sagittal · 0.33mm/px · 3 of 54 slices shown]
[im 18/54  brain]
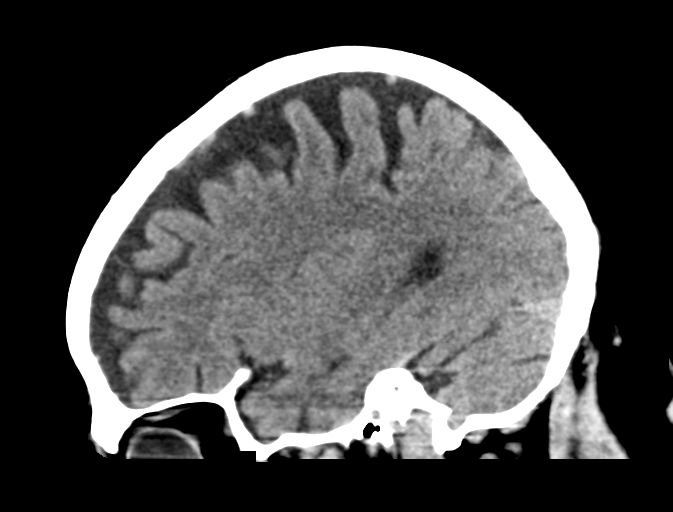
[im 27/54  brain]
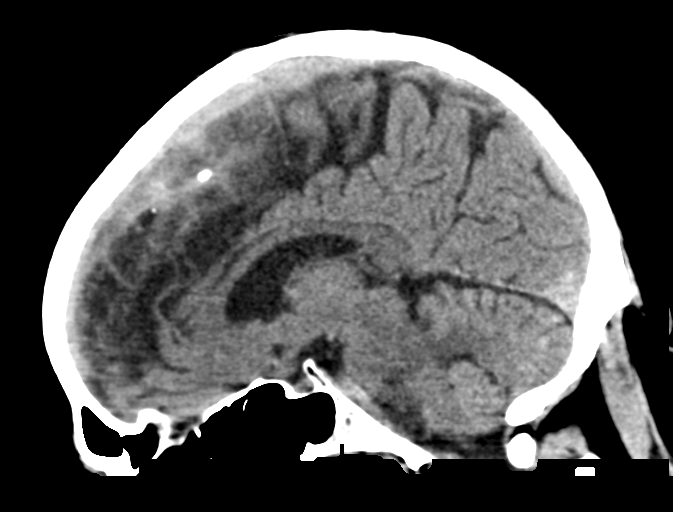
[im 36/54  brain]
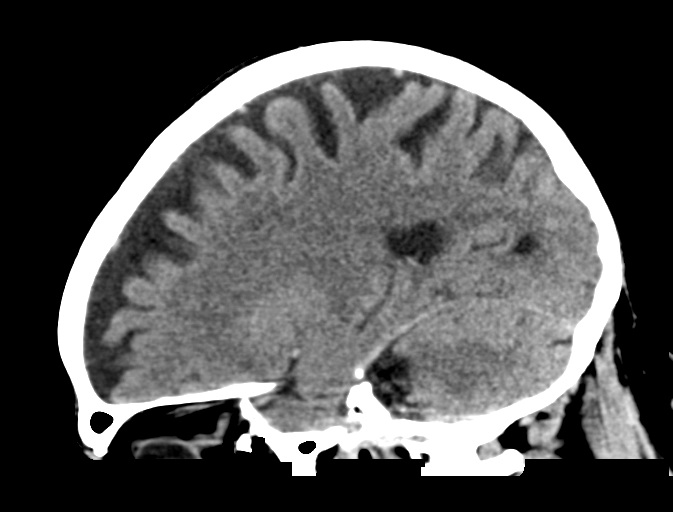

[16 of 47 positions shown; findings below may reference images not displayed]

FINDINGS: Brain: Age related volume loss. No evidence of old or acute focal
infarction, mass lesion, hemorrhage, hydrocephalus or extra-axial
collection.

Vascular: There is atherosclerotic calcification of the major
vessels at the base of the brain.

Skull: Negative

Sinuses/Orbits: Clear/normal

Other: None
IMPRESSION: No acute or focal finding.  Age related volume loss.

## 2021-03-15 DIAGNOSIS — Z9981 Dependence on supplemental oxygen: Secondary | ICD-10-CM | POA: Diagnosis not present

## 2021-03-15 DIAGNOSIS — F17210 Nicotine dependence, cigarettes, uncomplicated: Secondary | ICD-10-CM | POA: Diagnosis not present

## 2021-03-15 DIAGNOSIS — Z5181 Encounter for therapeutic drug level monitoring: Secondary | ICD-10-CM | POA: Diagnosis not present

## 2021-03-15 DIAGNOSIS — Z7982 Long term (current) use of aspirin: Secondary | ICD-10-CM | POA: Diagnosis not present

## 2021-03-15 DIAGNOSIS — Z7901 Long term (current) use of anticoagulants: Secondary | ICD-10-CM | POA: Diagnosis not present

## 2021-03-15 DIAGNOSIS — E782 Mixed hyperlipidemia: Secondary | ICD-10-CM | POA: Diagnosis not present

## 2021-03-15 DIAGNOSIS — I480 Paroxysmal atrial fibrillation: Secondary | ICD-10-CM | POA: Diagnosis not present

## 2021-03-15 DIAGNOSIS — I110 Hypertensive heart disease with heart failure: Secondary | ICD-10-CM | POA: Diagnosis not present

## 2021-03-15 DIAGNOSIS — J449 Chronic obstructive pulmonary disease, unspecified: Secondary | ICD-10-CM | POA: Diagnosis not present

## 2021-03-15 DIAGNOSIS — I509 Heart failure, unspecified: Secondary | ICD-10-CM | POA: Diagnosis not present

## 2021-03-15 DIAGNOSIS — R911 Solitary pulmonary nodule: Secondary | ICD-10-CM | POA: Diagnosis not present

## 2021-03-15 DIAGNOSIS — I25118 Atherosclerotic heart disease of native coronary artery with other forms of angina pectoris: Secondary | ICD-10-CM | POA: Diagnosis not present

## 2021-03-15 DIAGNOSIS — Z7951 Long term (current) use of inhaled steroids: Secondary | ICD-10-CM | POA: Diagnosis not present

## 2021-03-30 ENCOUNTER — Ambulatory Visit (INDEPENDENT_AMBULATORY_CARE_PROVIDER_SITE_OTHER): Payer: Medicare (Managed Care) | Admitting: Pharmacist

## 2021-03-30 ENCOUNTER — Other Ambulatory Visit: Payer: Self-pay

## 2021-03-30 ENCOUNTER — Telehealth: Payer: Self-pay | Admitting: *Deleted

## 2021-03-30 DIAGNOSIS — I480 Paroxysmal atrial fibrillation: Secondary | ICD-10-CM | POA: Diagnosis not present

## 2021-03-30 LAB — POCT INR: INR: 3.2 — AB (ref 2–3)

## 2021-03-30 NOTE — Telephone Encounter (Signed)
Addressed via [place] encounter, 03/30/21. See encounter for details.  Thank you, Darius Bump

## 2021-03-30 NOTE — Progress Notes (Signed)
Subjective:    Christopher Armstrong is a 80 y.o. male here for follow-up of chronic anticoagulation for atrial fibrillation. Bleeding signs/symptoms:  None Thromboembolic signs/symptoms:  None  Missed Coumadin doses: None Medication changes: no Dietary changes: no Bacterial/viral infection: no Other concerns: no    Objective:    Current warfarin (COUMADIN) dose: 5mg  daily Lab Results  Component Value Date   INR 3.2 (A) 03/30/2021   INR 2.8 03/02/2021   INR 2.8 12/01/2020   INR 1.7 (H) 05/09/2020       Assessment:    Supratherapeutic INR for goal of 2-3    Plan:    Dose: no change in the setting of a very stable patient, INR only slight above range, no changes in diet/medications. Will however shorten frequency of INR check instead of monthly, to be sure next level fluctuates back to within range. Next INR: 1 week , home health RN to call office on 04/06/21 with new INR

## 2021-03-30 NOTE — Telephone Encounter (Signed)
Angie from [facility] called with INR results this morning of 3.2.  Pt stated to her that he takes 5mg  every day.

## 2021-03-31 ENCOUNTER — Telehealth (INDEPENDENT_AMBULATORY_CARE_PROVIDER_SITE_OTHER): Payer: Medicare (Managed Care) | Admitting: Osteopathic Medicine

## 2021-03-31 VITALS — BP 148/72 | HR 60 | Temp 97.8°F | Wt 189.0 lb

## 2021-03-31 DIAGNOSIS — I480 Paroxysmal atrial fibrillation: Secondary | ICD-10-CM | POA: Diagnosis not present

## 2021-03-31 DIAGNOSIS — J449 Chronic obstructive pulmonary disease, unspecified: Secondary | ICD-10-CM

## 2021-03-31 MED ORDER — AMBULATORY NON FORMULARY MEDICATION: 99 refills | Status: DC

## 2021-03-31 NOTE — Progress Notes (Signed)
Attempted to contact the patient at 10:05 am. No answer, unable to leave a vm msg. Phone just keeps ringing, then disconnects.

## 2021-03-31 NOTE — Progress Notes (Signed)
Telemedicine Visit via  Audio only - telephone (patient preference /  technical difficulty with MyChart video application)  I connected with Christopher Armstrong on 03/31/21 at 10:39 AM  by phone or  telemedicine application as noted above  I verified that I am speaking with or regarding  the correct patient using two identifiers.  Participants: Myself, Dr [name] DO Patient: Christopher Armstrong Patient proxy if applicable: none Other, if applicable: none  Patient is at home I am in office at [facility]    I discussed the limitations of evaluation and management  by telemedicine and the availability of in person appointments.  The participant(s) above expressed understanding and  agreed to proceed with this appointment via telemedicine.       History of Present Illness: Christopher Armstrong is a 80 y.o. male who would like to discuss coughing and INR machine   Guaifenesin: caused some queasy feelings. He's not taking it with food. It really helps the mucus though.   Warfarin therapy: would like to get one of the home machines      Observations/Objective: BP (!) 148/72   Pulse 60   Temp 97.8 F (36.6 C)   Wt 189 lb (85.7 kg)   BMI 28.74 kg/m  BP Readings from Last 3 Encounters:  03/31/21 (!) 148/72  12/01/20 (!) 200/71  10/26/20 (!) 184/62   Exam: Normal Speech.  NAD  Lab and Radiology Results Results for orders placed or performed in visit on 03/30/21 (from the past 72 hour(s))  POCT INR     Status: Abnormal   Collection Time: 03/30/21 10:00 AM  Result Value Ref Range   INR 3.2 (A) 2 - 3   No results found.     Assessment and Plan: 80 y.o. male with The primary encounter diagnosis was Paroxysmal atrial fibrillation ([place]). A diagnosis of COPD (chronic obstructive pulmonary disease) with chronic bronchitis (HCC) was also pertinent to this visit.  Will try to arrange home machine if covered Advised take guaifenesin w/ food and e/ 8 oz water,  and/or can cut tablet in half to 100 mg dose at a time   PDMP not reviewed this encounter. No orders of the defined types were placed in this encounter.  Meds ordered this encounter  Medications   AMBULATORY NON FORMULARY MEDICATION    Sig: Home INR machine, Dx atrial fibrillation and long term anticoagulation with coumadin    Dispense:  1 Units    Refill:  99   There are no Patient Instructions on file for this visit.  Instructions sent via MyChart.   Follow Up Instructions: No follow-ups on file.    I discussed the assessment and treatment plan with the patient. The patient was provided an opportunity to ask questions and all were answered. The patient agreed with the plan and demonstrated an understanding of the instructions.   The patient was advised to call back or seek an in-person evaluation if any new concerns, if symptoms worsen or if the condition fails to improve as anticipated.  15 minutes of non-face-to-face time was provided during this encounter.      . . . . . . . . . . . . . Marland Kitchen                   Historical information moved to improve visibility of documentation.  Past Medical History:  Diagnosis Date   BPH (benign prostatic hyperplasia)    CHF (congestive heart failure) ([place])  06/23/2019   COPD (chronic obstructive pulmonary disease) with chronic bronchitis ([place]) 08/26/2018   Coronary artery disease of native artery of native heart with stable angina pectoris ([place]) 08/26/2018   Essential hypertension 08/26/2018   History of coronary artery stent placement 08/26/2018   Mixed hyperlipidemia 08/26/2018   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   Tobacco use disorder, severe, dependence 08/26/2018   Past Surgical History:  Procedure Laterality Date   BLADDER TUMOR EXCISION     CORONARY ANGIOPLASTY WITH STENT PLACEMENT     PROSTATE SURGERY     TONSILLECTOMY     Social History   Tobacco Use   Smoking status: Every Day    Packs/day: 0.50     Years: 65.00    Pack years: 32.50    Types: Cigarettes   Smokeless tobacco: Never  Substance Use Topics   Alcohol use: Not Currently    Alcohol/week: 0.0 standard drinks    Comment: 0   family history includes Fibromyalgia in his sister; Heart failure in his mother; High blood pressure in his mother and sister; Lung cancer in his father.  Medications: Current Outpatient Medications  Medication Sig Dispense Refill   albuterol (VENTOLIN HFA) 108 (90 Base) MCG/ACT inhaler Inhale 1-2 puffs into the lungs every 6 (six) hours as needed for wheezing or shortness of breath. 18 g 99   AMBULATORY NON FORMULARY MEDICATION Home INR machine, Dx atrial fibrillation and long term anticoagulation with coumadin 1 Units 99   aspirin EC 81 MG tablet Take 1 tablet (81 mg total) by mouth daily. 90 tablet 3   budesonide-formoterol (SYMBICORT) 160-4.5 MCG/ACT inhaler Inhale 2 puffs into the lungs 2 (two) times daily. 3 Inhaler 99   carvedilol (COREG) 12.5 MG tablet Take 1 tablet (12.5 mg total) by mouth 2 (two) times daily. 270 tablet 3   diltiazem (CARDIZEM) 30 MG tablet TAKE 1 TABLET BY MOUTH AS NEEDED 90 tablet 2   furosemide (LASIX) 40 MG tablet Take 0.5-1 tablets (20-40 mg total) by mouth daily. 90 tablet 1   guaiFENesin 200 MG tablet Take 1-2 tablets (200-400 mg total) by mouth every 6 (six) hours as needed for cough or to loosen phlegm. 90 tablet 1   ipratropium-albuterol (DUONEB) 0.5-2.5 (3) MG/3ML SOLN USE 1 VIAL IN NEBULIZER 5 TIMES DAILY 150 mL 11   lisinopril (ZESTRIL) 40 MG tablet Take 1 tablet (40 mg total) by mouth daily. 90 tablet 1   magnesium hydroxide (MILK OF MAGNESIA) 400 MG/5ML suspension Take 30 mLs by mouth as needed.     nitroGLYCERIN (NITROSTAT) 0.4 MG SL tablet Place 1 tablet (0.4 mg total) under the tongue every 5 (five) minutes as needed for chest pain. 28 tablet 1   OXYGEN Inhale into the lungs daily.     warfarin (COUMADIN) 5 MG tablet Take 1 tablet (5 mg total) by mouth daily  at 4 PM. 90 tablet 1   No current facility-administered medications for this visit.   Allergies  Allergen Reactions   Clopidogrel Other (See Comments)    "Purple blood pockets clots in mouth" Blood blisters in roof of mouth, urinary retention    Ticagrelor Shortness Of Breath and Other (See Comments)   Atorvastatin Other (See Comments)   Simvastatin    Statins Nausea And Vomiting and Other (See Comments)   Finasteride Other (See Comments)   Terazosin     Other reaction(s): Orthostatic hypotension     If phone visit, billing and coding can please add appropriate modifier if needed

## 2021-04-06 ENCOUNTER — Telehealth: Payer: Self-pay

## 2021-04-06 ENCOUNTER — Other Ambulatory Visit: Payer: Self-pay

## 2021-04-06 ENCOUNTER — Ambulatory Visit (INDEPENDENT_AMBULATORY_CARE_PROVIDER_SITE_OTHER): Payer: Medicare (Managed Care) | Admitting: Pharmacist

## 2021-04-06 DIAGNOSIS — I480 Paroxysmal atrial fibrillation: Secondary | ICD-10-CM | POA: Diagnosis not present

## 2021-04-06 LAB — POCT INR: INR: 3.3 — AB (ref 2–3)

## 2021-04-06 NOTE — Telephone Encounter (Signed)
[facility] called and gave today's INR/PT results:  INR 3.3 PT 39.4

## 2021-04-06 NOTE — Progress Notes (Signed)
Subjective:    Christopher Armstrong is a 80 y.o. male here for follow-up of chronic anticoagulation for atrial fibrillation. Bleeding signs/symptoms:  None Thromboembolic signs/symptoms:  None  Missed Coumadin doses: None Medication changes: no Dietary changes: no Bacterial/viral infection: no Other concerns: no   Objective:    Current warfarin (COUMADIN) dose: 5mg  daily Lab Results  Component Value Date   INR 3.3 (A) 04/06/2021   INR 3.2 (A) 03/30/2021   INR 2.8 03/02/2021   INR 1.7 (H) 05/09/2020       Assessment:    Supratherapeutic INR for goal of 2-3    Plan:    Dose: Take 1/2 tablet (2.5mg ) on Thursdays, 5mg  daily all other days. Next INR: 2 weeks ~04/20/21

## 2021-04-10 DIAGNOSIS — J42 Unspecified chronic bronchitis: Secondary | ICD-10-CM | POA: Diagnosis not present

## 2021-04-11 ENCOUNTER — Telehealth: Payer: Self-pay | Admitting: Osteopathic Medicine

## 2021-04-11 NOTE — Telephone Encounter (Signed)
Contacted patient regarding an update for home INR machine. Patient was given the contact info for [facility]) at [phone] to request an update. Patient is aware to contact the clinic for any other inquiries as needed.

## 2021-04-11 NOTE — Telephone Encounter (Signed)
Patient called yesterday and today to inquire about his PCP checking on getting him a home INR machine that he can purchase since his insurance only covers him so many visits into the office for this. If someone could please call him back

## 2021-04-13 ENCOUNTER — Telehealth: Payer: Self-pay

## 2021-04-13 ENCOUNTER — Other Ambulatory Visit: Payer: Self-pay

## 2021-04-13 ENCOUNTER — Ambulatory Visit (INDEPENDENT_AMBULATORY_CARE_PROVIDER_SITE_OTHER): Payer: Medicare (Managed Care) | Admitting: Pharmacist

## 2021-04-13 DIAGNOSIS — I480 Paroxysmal atrial fibrillation: Secondary | ICD-10-CM | POA: Diagnosis not present

## 2021-04-13 DIAGNOSIS — R911 Solitary pulmonary nodule: Secondary | ICD-10-CM | POA: Diagnosis not present

## 2021-04-13 DIAGNOSIS — Z5181 Encounter for therapeutic drug level monitoring: Secondary | ICD-10-CM | POA: Diagnosis not present

## 2021-04-13 DIAGNOSIS — I110 Hypertensive heart disease with heart failure: Secondary | ICD-10-CM | POA: Diagnosis not present

## 2021-04-13 DIAGNOSIS — Z7982 Long term (current) use of aspirin: Secondary | ICD-10-CM | POA: Diagnosis not present

## 2021-04-13 DIAGNOSIS — E782 Mixed hyperlipidemia: Secondary | ICD-10-CM | POA: Diagnosis not present

## 2021-04-13 DIAGNOSIS — J449 Chronic obstructive pulmonary disease, unspecified: Secondary | ICD-10-CM | POA: Diagnosis not present

## 2021-04-13 DIAGNOSIS — I25118 Atherosclerotic heart disease of native coronary artery with other forms of angina pectoris: Secondary | ICD-10-CM | POA: Diagnosis not present

## 2021-04-13 DIAGNOSIS — I509 Heart failure, unspecified: Secondary | ICD-10-CM | POA: Diagnosis not present

## 2021-04-13 DIAGNOSIS — Z7901 Long term (current) use of anticoagulants: Secondary | ICD-10-CM | POA: Diagnosis not present

## 2021-04-13 DIAGNOSIS — F17210 Nicotine dependence, cigarettes, uncomplicated: Secondary | ICD-10-CM | POA: Diagnosis not present

## 2021-04-13 DIAGNOSIS — Z7951 Long term (current) use of inhaled steroids: Secondary | ICD-10-CM | POA: Diagnosis not present

## 2021-04-13 DIAGNOSIS — Z9981 Dependence on supplemental oxygen: Secondary | ICD-10-CM | POA: Diagnosis not present

## 2021-04-13 LAB — POCT INR: INR: 2.8 (ref 2–3)

## 2021-04-13 NOTE — Telephone Encounter (Signed)
[name] with [facility] called and left a message.   [name]'s INR results.  PT 33 INR 2.8  [name] with [facility]

## 2021-04-13 NOTE — Progress Notes (Signed)
Subjective:    Christopher Armstrong is a 80 y.o. male here for follow-up of chronic anticoagulation for atrial fibrillation. Bleeding signs/symptoms:  None Thromboembolic signs/symptoms:  None  Missed Coumadin doses: None Medication changes: no Dietary changes: no Bacterial/viral infection: no Other concerns: no  Objective:    Current warfarin (COUMADIN) dose: warfarin 2.5mg  on Thursdays (1/2 tablet), 5mg  all other days (1 tablet) Lab Results  Component Value Date   INR 3.3 (A) 04/06/2021   INR 3.2 (A) 03/30/2021   INR 2.8 03/02/2021   INR 1.7 (H) 05/09/2020       Assessment:    Therapeutic INR for goal of 2-3    Plan:    Dose: no change Next INR: 2 weeks July 14, discussed w/ Christopher Armstrong and left VM for Christopher Armstrong w/Home health.

## 2021-04-14 DIAGNOSIS — Z7982 Long term (current) use of aspirin: Secondary | ICD-10-CM | POA: Diagnosis not present

## 2021-04-14 DIAGNOSIS — J449 Chronic obstructive pulmonary disease, unspecified: Secondary | ICD-10-CM | POA: Diagnosis not present

## 2021-04-14 DIAGNOSIS — I110 Hypertensive heart disease with heart failure: Secondary | ICD-10-CM | POA: Diagnosis not present

## 2021-04-14 DIAGNOSIS — Z7951 Long term (current) use of inhaled steroids: Secondary | ICD-10-CM | POA: Diagnosis not present

## 2021-04-14 DIAGNOSIS — E782 Mixed hyperlipidemia: Secondary | ICD-10-CM | POA: Diagnosis not present

## 2021-04-14 DIAGNOSIS — I25118 Atherosclerotic heart disease of native coronary artery with other forms of angina pectoris: Secondary | ICD-10-CM | POA: Diagnosis not present

## 2021-04-14 DIAGNOSIS — I509 Heart failure, unspecified: Secondary | ICD-10-CM | POA: Diagnosis not present

## 2021-04-14 DIAGNOSIS — I480 Paroxysmal atrial fibrillation: Secondary | ICD-10-CM | POA: Diagnosis not present

## 2021-04-14 DIAGNOSIS — Z9981 Dependence on supplemental oxygen: Secondary | ICD-10-CM | POA: Diagnosis not present

## 2021-04-14 DIAGNOSIS — F17210 Nicotine dependence, cigarettes, uncomplicated: Secondary | ICD-10-CM | POA: Diagnosis not present

## 2021-04-14 DIAGNOSIS — R911 Solitary pulmonary nodule: Secondary | ICD-10-CM | POA: Diagnosis not present

## 2021-04-14 DIAGNOSIS — Z5181 Encounter for therapeutic drug level monitoring: Secondary | ICD-10-CM | POA: Diagnosis not present

## 2021-04-14 DIAGNOSIS — Z7901 Long term (current) use of anticoagulants: Secondary | ICD-10-CM | POA: Diagnosis not present

## 2021-04-18 DIAGNOSIS — J42 Unspecified chronic bronchitis: Secondary | ICD-10-CM | POA: Diagnosis not present

## 2021-04-27 ENCOUNTER — Telehealth: Payer: Self-pay

## 2021-04-27 LAB — POCT INR: INR: 2.7 (ref 2–3)

## 2021-04-27 NOTE — Telephone Encounter (Signed)
Otila Kluver from [facility] [facility] called with PT INR results:  PT 32.5 INR 2.7  Callback number: [phone]

## 2021-04-28 ENCOUNTER — Other Ambulatory Visit: Payer: Self-pay

## 2021-04-28 ENCOUNTER — Ambulatory Visit (INDEPENDENT_AMBULATORY_CARE_PROVIDER_SITE_OTHER): Payer: Medicare (Managed Care) | Admitting: Pharmacist

## 2021-04-28 DIAGNOSIS — I480 Paroxysmal atrial fibrillation: Secondary | ICD-10-CM

## 2021-04-28 NOTE — Progress Notes (Signed)
Subjective:    Christopher Armstrong is a 80 y.o. male here for follow-up of chronic anticoagulation for atrial fibrillation. Bleeding signs/symptoms:  None Thromboembolic signs/symptoms:  None  Missed Coumadin doses: None Medication changes: no Dietary changes: no Bacterial/viral infection: no Other concerns: no   Objective:    Current warfarin (COUMADIN) dose: 1/2 tablet (2.5mg ) on Thursdays, 5mg  all other days Lab Results  Component Value Date   INR 2.7 04/27/2021   INR 2.8 04/13/2021   INR 3.3 (A) 04/06/2021   INR 1.7 (H) 05/09/2020       Assessment:    Therapeutic INR for goal of 2-3    Plan:    Dose: no change Next INR: 1 month

## 2021-05-03 DIAGNOSIS — Z0000 Encounter for general adult medical examination without abnormal findings: Secondary | ICD-10-CM | POA: Diagnosis not present

## 2021-05-04 DIAGNOSIS — J42 Unspecified chronic bronchitis: Secondary | ICD-10-CM | POA: Diagnosis not present

## 2021-05-05 ENCOUNTER — Telehealth: Payer: Self-pay

## 2021-05-05 ENCOUNTER — Encounter: Payer: Self-pay | Admitting: Medical-Surgical

## 2021-05-05 DIAGNOSIS — Z7901 Long term (current) use of anticoagulants: Secondary | ICD-10-CM | POA: Diagnosis not present

## 2021-05-05 NOTE — Telephone Encounter (Signed)
Erroneous encounter

## 2021-05-05 NOTE — Telephone Encounter (Signed)
Christopher Armstrong left a VM and stated he would like to talk to [facility] regarding his INR result. Pt did not say what his INR result was.   Callback number [phone].

## 2021-05-05 NOTE — Telephone Encounter (Signed)
Christopher Armstrong regarding his INR. Result of 2.1 today. He is taking Warfarin as prescribed but is worried that his INR dropped too fast. No changes in his dietary habits. Advised to continue his current regimen of Warfarin. Will forward this note to [facility] for her review on Monday and any recommendations she may have. Patient verbalized understanding and is agreeable to the plan.   Clearnce Sorrel, DNP, APRN, FNP-BC [facility]

## 2021-05-08 ENCOUNTER — Other Ambulatory Visit: Payer: Self-pay | Admitting: Osteopathic Medicine

## 2021-05-08 ENCOUNTER — Other Ambulatory Visit: Payer: Self-pay | Admitting: Adult Health

## 2021-05-09 DIAGNOSIS — J42 Unspecified chronic bronchitis: Secondary | ICD-10-CM | POA: Diagnosis not present

## 2021-05-12 ENCOUNTER — Telehealth (INDEPENDENT_AMBULATORY_CARE_PROVIDER_SITE_OTHER): Payer: Medicare (Managed Care) | Admitting: Osteopathic Medicine

## 2021-05-12 ENCOUNTER — Telehealth: Payer: Self-pay

## 2021-05-12 DIAGNOSIS — U071 COVID-19: Secondary | ICD-10-CM | POA: Diagnosis not present

## 2021-05-12 NOTE — Telephone Encounter (Signed)
Pt's sister called again and stated that pt has very bad diarrhea today now as well.  We literally have no openings for him to do a virtual appointment with anyone and he doesn't leave his home.  Please advise on antiviral.  Sister stated that the neighborhood [facility] has it in stock.

## 2021-05-12 NOTE — Progress Notes (Signed)
Telemedicine Visit via  Audio only - telephone (patient preference /  technical difficulty with MyChart video application)  I connected with Christopher Armstrong on 05/12/21 at 3:00 PM by phone or  telemedicine application as noted above  I verified that I am speaking with or regarding  the correct patient using two identifiers.  Participants: Myself, Dr [name] DO Patient: Christopher Armstrong Patient proxy if applicable: Sister, Christopher Armstrong Other, if applicable: none  Patient is at home I am in office at [facility]    I discussed the limitations of evaluation and management  by telemedicine and the availability of in person appointments.  The participant(s) above expressed understanding and  agreed to proceed with this appointment via telemedicine.       History of Present Illness: Christopher Armstrong is a 80 y.o. male who would like to discuss COVID.  Patient recently tested positive for COVID, yesterday after feeling a bit under the weather and fatigue.  Today he reports significant chest congestion and diarrhea.  Requesting antivirals.  He is unvaccinated.  He has significant COPD.  He reports mild to moderate shortness of breath and mild to moderate cough.  He reports multiple loose nonbloody bowel movements today        Observations/Objective: There were no vitals taken for this visit. BP Readings from Last 3 Encounters:  03/31/21 (!) 148/72  12/01/20 (!) 200/71  10/26/20 (!) 184/62   Exam: Normal Speech.  NAD  Lab and Radiology Results No results found for this or any previous visit (from the past 72 hour(s)). No results found.     Assessment and Plan: 80 y.o. male with The encounter diagnosis was COVID-19.  COVID-19 positive home test and unvaccinated patient with severe COPD.  No labs on file for this patient over the past year, I do not feel comfortable writing antivirals or other medications without in person evaluation.  Patient states he  would rather stay home, will consider going to ER/urgent care.  Patient's sister is reluctant to take him because she does not want to get sick and he is reluctant to wear a mask, so she does not want to take him in her car.  I advised that they may need to call 911 for ambulance transport but he needs to be evaluated in emergency room/urgent care given his high risk and no recent labs, particularly in light of his recent diarrhea problems which can certainly affect electrolytes/kidney function and may impact whether he would be a candidate for p.o. antivirals or if he needs alternative treatment.  He would also be a candidate for monoclonal antibody therapy if this is available.  PDMP not reviewed this encounter. No orders of the defined types were placed in this encounter.  No orders of the defined types were placed in this encounter.  There are no Patient Instructions on file for this visit.  Instructions sent via MyChart.   Follow Up Instructions: No follow-ups on file.    I discussed the assessment and treatment plan with the patient. The patient was provided an opportunity to ask questions and all were answered. The patient agreed with the plan and demonstrated an understanding of the instructions.   The patient was advised to call back or seek an in-person evaluation if any new concerns, if symptoms worsen or if the condition fails to improve as anticipated.  21 minutes of non-face-to-face time was provided during this encounter.      . . . . . . . . . . . . . Marland Kitchen  Historical information moved to improve visibility of documentation.  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   Tobacco use disorder, severe, dependence 08/26/2018   Past Surgical History:  Procedure Laterality Date   BLADDER TUMOR EXCISION     CORONARY ANGIOPLASTY WITH STENT PLACEMENT     PROSTATE SURGERY     TONSILLECTOMY     Social History   Tobacco Use   Smoking status: Every Day    Packs/day: 0.50    Years: 65.00    Pack years: 32.50    Types: Cigarettes   Smokeless tobacco: Never  Substance Use Topics   Alcohol use: Not Currently    Alcohol/week: 0.0 standard drinks    Comment: 0   family history includes Fibromyalgia in his sister; Heart failure in his mother; High blood pressure in his mother and sister; Lung cancer in his father.  Medications: Current Outpatient Medications  Medication Sig Dispense Refill   albuterol (VENTOLIN HFA) 108 (90 Base) MCG/ACT inhaler Inhale 1-2 puffs into the lungs every 6 (six) hours as needed for wheezing or shortness of breath. 18 g 99   AMBULATORY NON FORMULARY MEDICATION Home INR machine, Dx atrial fibrillation and long term anticoagulation with coumadin 1 Units 99   aspirin EC 81 MG tablet Take 1 tablet (81 mg total) by mouth daily. 90 tablet 3   budesonide-formoterol (SYMBICORT) 160-4.5 MCG/ACT inhaler Inhale 2 puffs into the lungs 2 (two) times daily. 3 Inhaler 99   carvedilol (COREG) 12.5 MG tablet TAKE 1 & 1/2 (ONE & ONE-HALF) TABLETS BY MOUTH TWICE DAILY 45 tablet 0   diltiazem (CARDIZEM) 30 MG tablet TAKE 1 TABLET BY MOUTH AS NEEDED 90 tablet 2   furosemide (LASIX) 40 MG tablet Take 0.5-1 tablets (20-40 mg total) by mouth daily. 90 tablet 1   guaiFENesin 200 MG tablet Take 1-2 tablets (200-400 mg total) by mouth every 6 (six) hours as needed for cough or to loosen phlegm. 90 tablet 1   ipratropium-albuterol (DUONEB) 0.5-2.5 (3) MG/3ML SOLN USE 1 VIAL IN NEBULIZER 5 TIMES  DAILY 150 mL 11   lisinopril (ZESTRIL) 40 MG tablet Take 1 tablet by mouth once daily 90 tablet 0   magnesium hydroxide (MILK OF MAGNESIA) 400 MG/5ML suspension Take 30 mLs by mouth as needed.     nitroGLYCERIN (NITROSTAT) 0.4 MG SL tablet Place 1 tablet (0.4 mg total) under the tongue every 5 (five) minutes as needed for chest pain. 28 tablet 1   OXYGEN Inhale into the lungs daily.     warfarin (COUMADIN) 5 MG tablet Take 1 tablet (5 mg total) by mouth daily at 4 PM. 90 tablet 1   No current facility-administered medications for this visit.   Allergies  Allergen Reactions   Clopidogrel Other (See Comments)    "Purple blood pockets clots in mouth" Blood blisters in roof of mouth, urinary retention    Ticagrelor Shortness Of Breath and Other (See Comments)   Atorvastatin Other (See Comments)   Simvastatin    Statins Nausea And Vomiting and Other (See Comments)   Finasteride Other (See Comments)   Terazosin  Other reaction(s): Orthostatic hypotension     If phone visit, billing and coding can please add appropriate modifier if needed

## 2021-05-12 NOTE — Telephone Encounter (Signed)
Pt left a vm msg stating he tested positive for Covid yesterday. Per pt, has a fever and congestion. Requesting if provider can send in an antiviral rx.

## 2021-05-12 NOTE — Telephone Encounter (Signed)
See virtual visit

## 2021-05-14 DIAGNOSIS — Z23 Encounter for immunization: Secondary | ICD-10-CM | POA: Diagnosis not present

## 2021-05-14 DIAGNOSIS — R1310 Dysphagia, unspecified: Secondary | ICD-10-CM | POA: Diagnosis not present

## 2021-05-14 DIAGNOSIS — E782 Mixed hyperlipidemia: Secondary | ICD-10-CM | POA: Diagnosis not present

## 2021-05-14 DIAGNOSIS — J449 Chronic obstructive pulmonary disease, unspecified: Secondary | ICD-10-CM | POA: Diagnosis not present

## 2021-05-14 DIAGNOSIS — R1314 Dysphagia, pharyngoesophageal phase: Secondary | ICD-10-CM | POA: Diagnosis not present

## 2021-05-14 DIAGNOSIS — R069 Unspecified abnormalities of breathing: Secondary | ICD-10-CM | POA: Diagnosis not present

## 2021-05-14 DIAGNOSIS — I480 Paroxysmal atrial fibrillation: Secondary | ICD-10-CM | POA: Diagnosis not present

## 2021-05-14 DIAGNOSIS — I959 Hypotension, unspecified: Secondary | ICD-10-CM | POA: Diagnosis not present

## 2021-05-14 DIAGNOSIS — F17210 Nicotine dependence, cigarettes, uncomplicated: Secondary | ICD-10-CM | POA: Diagnosis not present

## 2021-05-14 DIAGNOSIS — E860 Dehydration: Secondary | ICD-10-CM | POA: Diagnosis not present

## 2021-05-14 DIAGNOSIS — R5383 Other fatigue: Secondary | ICD-10-CM | POA: Diagnosis not present

## 2021-05-14 DIAGNOSIS — E876 Hypokalemia: Secondary | ICD-10-CM | POA: Diagnosis not present

## 2021-05-14 DIAGNOSIS — Z79899 Other long term (current) drug therapy: Secondary | ICD-10-CM | POA: Diagnosis not present

## 2021-05-14 DIAGNOSIS — I10 Essential (primary) hypertension: Secondary | ICD-10-CM | POA: Diagnosis not present

## 2021-05-14 DIAGNOSIS — R600 Localized edema: Secondary | ICD-10-CM | POA: Diagnosis not present

## 2021-05-14 DIAGNOSIS — R609 Edema, unspecified: Secondary | ICD-10-CM | POA: Diagnosis not present

## 2021-05-14 DIAGNOSIS — U071 COVID-19: Secondary | ICD-10-CM | POA: Diagnosis not present

## 2021-05-14 DIAGNOSIS — J9811 Atelectasis: Secondary | ICD-10-CM | POA: Diagnosis not present

## 2021-05-14 DIAGNOSIS — R0602 Shortness of breath: Secondary | ICD-10-CM | POA: Diagnosis not present

## 2021-05-14 DIAGNOSIS — Z7982 Long term (current) use of aspirin: Secondary | ICD-10-CM | POA: Diagnosis not present

## 2021-05-14 DIAGNOSIS — Z7901 Long term (current) use of anticoagulants: Secondary | ICD-10-CM | POA: Diagnosis not present

## 2021-05-14 DIAGNOSIS — R062 Wheezing: Secondary | ICD-10-CM | POA: Diagnosis not present

## 2021-05-14 DIAGNOSIS — R1110 Vomiting, unspecified: Secondary | ICD-10-CM | POA: Diagnosis not present

## 2021-05-14 DIAGNOSIS — R059 Cough, unspecified: Secondary | ICD-10-CM | POA: Diagnosis not present

## 2021-05-14 DIAGNOSIS — R918 Other nonspecific abnormal finding of lung field: Secondary | ICD-10-CM | POA: Diagnosis not present

## 2021-05-14 DIAGNOSIS — J441 Chronic obstructive pulmonary disease with (acute) exacerbation: Secondary | ICD-10-CM | POA: Diagnosis not present

## 2021-05-14 DIAGNOSIS — I25119 Atherosclerotic heart disease of native coronary artery with unspecified angina pectoris: Secondary | ICD-10-CM | POA: Diagnosis not present

## 2021-05-14 DIAGNOSIS — R531 Weakness: Secondary | ICD-10-CM | POA: Diagnosis not present

## 2021-05-14 DIAGNOSIS — Z9981 Dependence on supplemental oxygen: Secondary | ICD-10-CM | POA: Diagnosis not present

## 2021-05-14 DIAGNOSIS — J439 Emphysema, unspecified: Secondary | ICD-10-CM | POA: Diagnosis not present

## 2021-05-15 ENCOUNTER — Telehealth: Payer: Self-pay | Admitting: Osteopathic Medicine

## 2021-05-15 DIAGNOSIS — I480 Paroxysmal atrial fibrillation: Secondary | ICD-10-CM | POA: Diagnosis not present

## 2021-05-15 DIAGNOSIS — J449 Chronic obstructive pulmonary disease, unspecified: Secondary | ICD-10-CM | POA: Diagnosis not present

## 2021-05-15 DIAGNOSIS — E782 Mixed hyperlipidemia: Secondary | ICD-10-CM | POA: Diagnosis not present

## 2021-05-15 DIAGNOSIS — I110 Hypertensive heart disease with heart failure: Secondary | ICD-10-CM | POA: Diagnosis not present

## 2021-05-15 DIAGNOSIS — Z7982 Long term (current) use of aspirin: Secondary | ICD-10-CM | POA: Diagnosis not present

## 2021-05-15 DIAGNOSIS — Z9981 Dependence on supplemental oxygen: Secondary | ICD-10-CM | POA: Diagnosis not present

## 2021-05-15 DIAGNOSIS — I25118 Atherosclerotic heart disease of native coronary artery with other forms of angina pectoris: Secondary | ICD-10-CM | POA: Diagnosis not present

## 2021-05-15 DIAGNOSIS — F17210 Nicotine dependence, cigarettes, uncomplicated: Secondary | ICD-10-CM | POA: Diagnosis not present

## 2021-05-15 DIAGNOSIS — Z7951 Long term (current) use of inhaled steroids: Secondary | ICD-10-CM | POA: Diagnosis not present

## 2021-05-15 DIAGNOSIS — I509 Heart failure, unspecified: Secondary | ICD-10-CM | POA: Diagnosis not present

## 2021-05-15 DIAGNOSIS — R911 Solitary pulmonary nodule: Secondary | ICD-10-CM | POA: Diagnosis not present

## 2021-05-15 DIAGNOSIS — I10 Essential (primary) hypertension: Secondary | ICD-10-CM | POA: Diagnosis not present

## 2021-05-15 DIAGNOSIS — Z7901 Long term (current) use of anticoagulants: Secondary | ICD-10-CM | POA: Diagnosis not present

## 2021-05-15 DIAGNOSIS — R0602 Shortness of breath: Secondary | ICD-10-CM | POA: Diagnosis not present

## 2021-05-15 DIAGNOSIS — Z5181 Encounter for therapeutic drug level monitoring: Secondary | ICD-10-CM | POA: Diagnosis not present

## 2021-05-15 DIAGNOSIS — E860 Dehydration: Secondary | ICD-10-CM | POA: Diagnosis not present

## 2021-05-15 DIAGNOSIS — J441 Chronic obstructive pulmonary disease with (acute) exacerbation: Secondary | ICD-10-CM | POA: Diagnosis not present

## 2021-05-15 DIAGNOSIS — U071 COVID-19: Secondary | ICD-10-CM | POA: Diagnosis not present

## 2021-05-15 NOTE — Telephone Encounter (Signed)
Burna Forts called. She states that Mr. Christopher Armstrong is in hospital with [facility]. She also wanted to know if we got his INR number- 1.6 because he has not heard anything back from [place].  Thank you.

## 2021-05-15 NOTE — Telephone Encounter (Signed)
Hospital should be monitoring his INR and adjusting meds appropriately while he is there. Plan for follow up in office pending discharge condition

## 2021-05-16 DIAGNOSIS — J441 Chronic obstructive pulmonary disease with (acute) exacerbation: Secondary | ICD-10-CM | POA: Diagnosis not present

## 2021-05-16 DIAGNOSIS — R0602 Shortness of breath: Secondary | ICD-10-CM | POA: Diagnosis not present

## 2021-05-16 DIAGNOSIS — E860 Dehydration: Secondary | ICD-10-CM | POA: Diagnosis not present

## 2021-05-16 DIAGNOSIS — J439 Emphysema, unspecified: Secondary | ICD-10-CM | POA: Diagnosis not present

## 2021-05-16 DIAGNOSIS — U071 COVID-19: Secondary | ICD-10-CM | POA: Diagnosis not present

## 2021-05-17 DIAGNOSIS — I480 Paroxysmal atrial fibrillation: Secondary | ICD-10-CM | POA: Diagnosis not present

## 2021-05-17 DIAGNOSIS — J80 Acute respiratory distress syndrome: Secondary | ICD-10-CM | POA: Diagnosis not present

## 2021-05-17 DIAGNOSIS — R0602 Shortness of breath: Secondary | ICD-10-CM | POA: Diagnosis not present

## 2021-05-17 DIAGNOSIS — U071 COVID-19: Secondary | ICD-10-CM | POA: Diagnosis not present

## 2021-05-17 DIAGNOSIS — I4891 Unspecified atrial fibrillation: Secondary | ICD-10-CM | POA: Diagnosis not present

## 2021-05-17 DIAGNOSIS — I25118 Atherosclerotic heart disease of native coronary artery with other forms of angina pectoris: Secondary | ICD-10-CM | POA: Diagnosis not present

## 2021-05-17 DIAGNOSIS — R1314 Dysphagia, pharyngoesophageal phase: Secondary | ICD-10-CM | POA: Diagnosis not present

## 2021-05-17 DIAGNOSIS — Z7901 Long term (current) use of anticoagulants: Secondary | ICD-10-CM | POA: Diagnosis not present

## 2021-05-17 DIAGNOSIS — R197 Diarrhea, unspecified: Secondary | ICD-10-CM | POA: Diagnosis not present

## 2021-05-17 DIAGNOSIS — K920 Hematemesis: Secondary | ICD-10-CM | POA: Diagnosis not present

## 2021-05-17 DIAGNOSIS — J449 Chronic obstructive pulmonary disease, unspecified: Secondary | ICD-10-CM | POA: Diagnosis not present

## 2021-05-17 DIAGNOSIS — F17200 Nicotine dependence, unspecified, uncomplicated: Secondary | ICD-10-CM | POA: Diagnosis not present

## 2021-05-18 DIAGNOSIS — U071 COVID-19: Secondary | ICD-10-CM | POA: Diagnosis not present

## 2021-05-18 DIAGNOSIS — J80 Acute respiratory distress syndrome: Secondary | ICD-10-CM | POA: Diagnosis not present

## 2021-05-18 DIAGNOSIS — I480 Paroxysmal atrial fibrillation: Secondary | ICD-10-CM | POA: Diagnosis not present

## 2021-05-18 DIAGNOSIS — R1314 Dysphagia, pharyngoesophageal phase: Secondary | ICD-10-CM | POA: Diagnosis not present

## 2021-05-18 DIAGNOSIS — I10 Essential (primary) hypertension: Secondary | ICD-10-CM | POA: Diagnosis not present

## 2021-05-18 DIAGNOSIS — J441 Chronic obstructive pulmonary disease with (acute) exacerbation: Secondary | ICD-10-CM | POA: Diagnosis not present

## 2021-05-19 DIAGNOSIS — I480 Paroxysmal atrial fibrillation: Secondary | ICD-10-CM | POA: Diagnosis not present

## 2021-05-19 DIAGNOSIS — I10 Essential (primary) hypertension: Secondary | ICD-10-CM | POA: Diagnosis not present

## 2021-05-19 DIAGNOSIS — R1310 Dysphagia, unspecified: Secondary | ICD-10-CM | POA: Diagnosis not present

## 2021-05-19 DIAGNOSIS — R1314 Dysphagia, pharyngoesophageal phase: Secondary | ICD-10-CM | POA: Diagnosis not present

## 2021-05-19 DIAGNOSIS — U071 COVID-19: Secondary | ICD-10-CM | POA: Diagnosis not present

## 2021-05-19 DIAGNOSIS — J441 Chronic obstructive pulmonary disease with (acute) exacerbation: Secondary | ICD-10-CM | POA: Diagnosis not present

## 2021-05-19 LAB — POCT INR: INR: 2.9 (ref 2–3)

## 2021-05-22 ENCOUNTER — Ambulatory Visit (INDEPENDENT_AMBULATORY_CARE_PROVIDER_SITE_OTHER): Payer: Medicare (Managed Care) | Admitting: Pharmacist

## 2021-05-22 ENCOUNTER — Telehealth: Payer: Self-pay | Admitting: Osteopathic Medicine

## 2021-05-22 ENCOUNTER — Other Ambulatory Visit: Payer: Self-pay

## 2021-05-22 DIAGNOSIS — I480 Paroxysmal atrial fibrillation: Secondary | ICD-10-CM

## 2021-05-22 NOTE — Telephone Encounter (Signed)
Needs hospital follow up appt - ok to be virtual and can use any open slot

## 2021-05-22 NOTE — Telephone Encounter (Signed)
Home INR checked:  05/12/21 - 1.6 05/05/21 - 2.1  Recently hospitalized

## 2021-05-22 NOTE — Progress Notes (Signed)
Subjective:    Christopher Armstrong is a 80 y.o. male here for follow-up of chronic anticoagulation for atrial fibrillation. Bleeding signs/symptoms:  None Thromboembolic signs/symptoms:  None  Missed Coumadin doses: Patient was hospitalized 05/19/21, warfarin held temporarily for heme positive emesis. Cleared to restart by GI. Medication changes: no Dietary changes: yes - brief hospital stay Bacterial/viral infection: yes - covid positive Other concerns: no   Objective:    Current warfarin (COUMADIN) dose: warfarin '5mg'$  daily, except 2.'5mg'$  on Thursdays Lab Results  Component Value Date   INR 2.9 05/19/2021   INR 2.7 04/27/2021   INR 2.8 04/13/2021   INR 1.7 (H) 05/09/2020       Assessment:    Therapeutic INR for goal of 2-3    Plan:    Dose: no change Next INR: 1 week on Friday using home INR machine, test 05/26/21.

## 2021-05-22 NOTE — Telephone Encounter (Signed)
Have you received any results for this patient?

## 2021-05-22 NOTE — Telephone Encounter (Signed)
[name]'s sister, Christopher Armstrong called. She has scheduled a virtual appointment for Christopher Armstrong on Thursday, 8/11.  She wants to know if Dr. Sheppard Coil has gotten the results from [name]'s Barium swallow and other lab results since being discharged from [facility] on August 5th.  Thank you.

## 2021-05-22 NOTE — Telephone Encounter (Signed)
Pt's sister left a vm on triage line asking for a referral for meals on wheels.  She has already contacted [place] and they stated that they will cover 2 meals a day for 1 week. She is also needing to get wheelchair.

## 2021-05-23 NOTE — Telephone Encounter (Signed)
Appointment has been made already.

## 2021-05-24 ENCOUNTER — Telehealth: Payer: Self-pay | Admitting: Osteopathic Medicine

## 2021-05-24 DIAGNOSIS — J440 Chronic obstructive pulmonary disease with acute lower respiratory infection: Secondary | ICD-10-CM

## 2021-05-24 DIAGNOSIS — R5381 Other malaise: Secondary | ICD-10-CM

## 2021-05-24 NOTE — Telephone Encounter (Signed)
Patients sister walked in and needs referral to [place] for [place] the nurse has already been to set patient up and wants to get Social Services out to him. Patient also needs referral sent to [facility] for Meals on Wheels to be started. His sister will be going out of town for a week and there will be no one to help him with this. - CF

## 2021-05-24 NOTE — Telephone Encounter (Signed)
Ok to pend orders and il'll sign He needs HFU appointment though sometime next few weeks

## 2021-05-24 NOTE — Telephone Encounter (Signed)
Pls review orders for referrals. Unsure if correct referrals were selected. Thanks in advance.

## 2021-05-25 ENCOUNTER — Telehealth (INDEPENDENT_AMBULATORY_CARE_PROVIDER_SITE_OTHER): Payer: Medicare (Managed Care) | Admitting: Osteopathic Medicine

## 2021-05-25 ENCOUNTER — Encounter: Payer: Self-pay | Admitting: Osteopathic Medicine

## 2021-05-25 VITALS — Wt 180.0 lb

## 2021-05-25 DIAGNOSIS — J440 Chronic obstructive pulmonary disease with acute lower respiratory infection: Secondary | ICD-10-CM | POA: Diagnosis not present

## 2021-05-25 DIAGNOSIS — U071 COVID-19: Secondary | ICD-10-CM

## 2021-05-25 DIAGNOSIS — R5381 Other malaise: Secondary | ICD-10-CM

## 2021-05-25 NOTE — Progress Notes (Signed)
Telemedicine Visit via  Audio only - telephone (patient preference /  technical difficulty with MyChart video application)  I connected with Christopher Armstrong on 05/25/21 at 11:50 AM  by phone or  telemedicine application as noted above  I verified that I am speaking with or regarding  the correct patient using two identifiers.  Participants: Myself, Dr [name] DO Patient: Christopher Armstrong Patient proxy if applicable: none Other, if applicable: none  Patient is at home I am in office at [facility]    I discussed the limitations of evaluation and management  by telemedicine and the availability of in person appointments.  The participant(s) above expressed understanding and  agreed to proceed with this appointment via telemedicine.       History of Present Illness: Christopher Armstrong is a 80 y.o. male who would like to discuss  Chief Complaint  Patient presents with   Covid Positive    X 2 weeks   Fatigue   Anorexia   Patient tested positive for COVID 2 weeks ago, given symptoms and underlying COPD, unvaccinated status, he was advised to seek medical attention in person.  He ended up being admitted to the hospital.  He has continued to recover well with the exception of severe fatigue.  He reports no unusual shortness of breath above his usual COPD baseline.  No fever.  His sister had called the office earlier this week to request referrals for home health and Meals on Wheels      Observations/Objective: Wt 180 lb (81.6 kg)   BMI 27.37 kg/m  BP Readings from Last 3 Encounters:  03/31/21 (!) 148/72  12/01/20 (!) 200/71  10/26/20 (!) 184/62   Exam: Normal Speech.  NAD  Lab and Radiology Results No results found for this or any previous visit (from the past 72 hour(s)). No results found.  Has hard     Assessment and Plan: 80 y.o. male with The primary encounter diagnosis was COVID-19. Diagnoses of Debility and Chronic obstructive pulmonary  disease with acute lower respiratory infection ([place]) -recent positive COVID, recovering were also pertinent to this visit.  Overall, patient is stable is much as I can tell through virtual visit.  Has some unmet ADL/self-care needs, home health referral was placed earlier today.  Patient inquires about help with activities of daily living such as bathing, dressing, cleaning.  I advised him that home health nursing/physical therapy will not be able to help with assisting these activities and he should reach out to the [place] to see if they have any benefits or he will need to pay for the services out-of-pocket.  We will connect with social work as needed.   PDMP not reviewed this encounter. No orders of the defined types were placed in this encounter.  No orders of the defined types were placed in this encounter.  There are no Patient Instructions on file for this visit.  Instructions sent via MyChart.   Follow Up Instructions: Return if symptoms worsen or fail to improve, to check up on chronic conditions 3-6 mos..    I discussed the assessment and treatment plan with the patient. The patient was provided an opportunity to ask questions and all were answered. The patient agreed with the plan and demonstrated an understanding of the instructions.   The patient was advised to call back or seek an in-person evaluation if any new concerns, if symptoms worsen or if the condition fails to improve as anticipated.  15No minutes of  non-face-to-face time was provided during this encounter.      . . . . . . . . . . . . . Marland Kitchen                   Historical information moved to improve visibility of documentation.  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   Tobacco use disorder, severe, dependence 08/26/2018   Past Surgical History:  Procedure Laterality Date   BLADDER TUMOR EXCISION     CORONARY ANGIOPLASTY WITH STENT PLACEMENT     PROSTATE SURGERY     TONSILLECTOMY     Social History   Tobacco Use   Smoking status: Every Day    Packs/day: 0.50    Years: 65.00    Pack years: 32.50    Types: Cigarettes   Smokeless tobacco: Never  Substance Use Topics   Alcohol use: Not Currently    Alcohol/week: 0.0 standard drinks    Comment: 0   family history includes Fibromyalgia in his sister; Heart failure in his mother; High blood pressure in his mother and sister; Lung cancer in his father.  Medications: Current Outpatient Medications  Medication Sig Dispense Refill   albuterol (VENTOLIN HFA) 108 (90 Base) MCG/ACT inhaler Inhale 1-2 puffs into the lungs every 6 (six) hours as needed for wheezing or shortness of breath. 18 g 99   AMBULATORY NON FORMULARY MEDICATION Home INR machine, Dx atrial fibrillation and long term anticoagulation with coumadin 1 Units 99   aspirin EC 81 MG tablet Take 1 tablet (81 mg total) by mouth daily. 90 tablet 3   budesonide-formoterol (SYMBICORT) 160-4.5 MCG/ACT inhaler Inhale 2 puffs into the lungs 2 (two) times daily. 3 Inhaler 99   carvedilol (COREG) 12.5 MG tablet TAKE 1 & 1/2 (ONE & ONE-HALF) TABLETS BY MOUTH TWICE DAILY 45 tablet 0   diltiazem (CARDIZEM) 30 MG tablet TAKE 1 TABLET BY MOUTH AS NEEDED 90 tablet 2   furosemide (LASIX) 40 MG tablet Take 0.5-1 tablets (20-40 mg total) by mouth daily. 90 tablet 1   guaiFENesin 200 MG tablet Take 1-2 tablets (200-400 mg total) by mouth every 6 (six) hours as needed for cough or to loosen phlegm. 90 tablet 1   ipratropium-albuterol (DUONEB) 0.5-2.5  (3) MG/3ML SOLN USE 1 VIAL IN NEBULIZER 5 TIMES DAILY 150 mL 11   lisinopril (ZESTRIL) 40 MG tablet Take 1 tablet by mouth once daily 90 tablet 0   magnesium hydroxide (MILK OF MAGNESIA) 400 MG/5ML suspension Take 30 mLs by mouth as needed.     nitroGLYCERIN (NITROSTAT) 0.4 MG SL tablet Place 1 tablet (0.4 mg total) under the tongue every 5 (five) minutes as needed for chest pain. 28 tablet 1   OXYGEN Inhale into the lungs daily.     warfarin (COUMADIN) 5 MG tablet Take 1 tablet (5 mg total) by mouth daily at 4 PM. 90 tablet 1   No current facility-administered medications for this visit.   Allergies  Allergen Reactions   Clopidogrel Other (See Comments)    "Purple blood pockets clots in mouth" Blood blisters in roof  of mouth, urinary retention    Ticagrelor Shortness Of Breath and Other (See Comments)   Atorvastatin Other (See Comments)   Simvastatin    Statins Nausea And Vomiting and Other (See Comments)   Finasteride Other (See Comments)   Terazosin     Other reaction(s): Orthostatic hypotension     If phone visit, billing and coding can please add appropriate modifier if needed

## 2021-05-26 ENCOUNTER — Ambulatory Visit (INDEPENDENT_AMBULATORY_CARE_PROVIDER_SITE_OTHER): Payer: Medicare (Managed Care) | Admitting: Pharmacist

## 2021-05-26 ENCOUNTER — Other Ambulatory Visit: Payer: Self-pay

## 2021-05-26 DIAGNOSIS — J440 Chronic obstructive pulmonary disease with acute lower respiratory infection: Secondary | ICD-10-CM

## 2021-05-26 DIAGNOSIS — I480 Paroxysmal atrial fibrillation: Secondary | ICD-10-CM

## 2021-05-26 DIAGNOSIS — R5381 Other malaise: Secondary | ICD-10-CM

## 2021-05-26 NOTE — Progress Notes (Signed)
Subjective:    Christopher Armstrong is a 80 y.o. male here for follow-up of chronic anticoagulation for atrial fibrillation. Bleeding signs/symptoms:  None Thromboembolic signs/symptoms:  None  Missed Coumadin doses: None Medication changes: no Dietary changes: no Bacterial/viral infection: no Other concerns: no   Objective:    Current warfarin (COUMADIN) dose: warfarin '5mg'$  except 2.'5mg'$  on Thursdays Lab Results  Component Value Date   INR 2.9 05/19/2021   INR 2.7 04/27/2021   INR 2.8 04/13/2021   INR 1.7 (H) 05/09/2020       Assessment:    No new  INR for goal of 2-3    Plan:    Dose: no change, patient was unable to collect INR today - tried twice and both readings said "error." The INR machine company advised him not to check a 3rd time and use up more strips. Will allow 1 grace week, no dose change. Next INR: 1 week

## 2021-05-26 NOTE — Progress Notes (Signed)
Per [name], previous orders was missing additional information. Order for Christopher Armstrong re-order with social worker information included. Rx for meal on wheels for [name] and referral pended for provider.

## 2021-05-30 ENCOUNTER — Other Ambulatory Visit: Payer: Self-pay

## 2021-05-30 ENCOUNTER — Ambulatory Visit (INDEPENDENT_AMBULATORY_CARE_PROVIDER_SITE_OTHER): Payer: Medicare (Managed Care) | Admitting: Osteopathic Medicine

## 2021-05-30 ENCOUNTER — Encounter: Payer: Self-pay | Admitting: Osteopathic Medicine

## 2021-05-30 VITALS — BP 147/68 | HR 75

## 2021-05-30 DIAGNOSIS — R5381 Other malaise: Secondary | ICD-10-CM

## 2021-05-30 DIAGNOSIS — I480 Paroxysmal atrial fibrillation: Secondary | ICD-10-CM | POA: Diagnosis not present

## 2021-05-30 DIAGNOSIS — I5032 Chronic diastolic (congestive) heart failure: Secondary | ICD-10-CM | POA: Diagnosis not present

## 2021-05-30 DIAGNOSIS — I10 Essential (primary) hypertension: Secondary | ICD-10-CM

## 2021-05-30 DIAGNOSIS — J449 Chronic obstructive pulmonary disease, unspecified: Secondary | ICD-10-CM | POA: Diagnosis not present

## 2021-05-30 LAB — POCT INR: INR: 6.8 — AB (ref 2.0–3.0)

## 2021-05-30 MED ORDER — AMBULATORY NON FORMULARY MEDICATION: 99 refills | Status: DC

## 2021-05-30 MED ORDER — CARVEDILOL 12.5 MG PO TABS: 18.7500 mg | ORAL_TABLET | Freq: Two times a day (BID) | ORAL | 3 refills | Status: DC

## 2021-05-30 MED ORDER — LISINOPRIL 40 MG PO TABS: 40.0000 mg | ORAL_TABLET | Freq: Every day | ORAL | 3 refills | Status: DC

## 2021-05-30 NOTE — Progress Notes (Signed)
Christopher Armstrong is a 80 y.o. male who presents to  [place] at [facility] accompanied by his sister today, 05/30/21, seeking care for the following:  COPD follow-up Follow up recent COVID illness Debility d/t above - needs home health services and getting set up with [place] services for home health and possibly assisted living      [place] with other pertinent findings:  The primary encounter diagnosis was COPD (chronic obstructive pulmonary disease) with chronic bronchitis ([place]). Diagnoses of Debility, White coat syndrome with hypertension, Paroxysmal atrial fibrillation (HCC), and Chronic diastolic congestive heart failure ([place]) were also pertinent to this visit.    Patient Instructions  Orders placed for manual wheelchair and motorized wheelchair  Will see how you do with physical therapy  Will send whatever the [place] needs - they may have more resources!  Refilled medications If swallowing problems, let me know and will get you in with GI  INR today HIGH - SKIP WARFARIN TONIGHT and I'll consult w/ Lysle Morales  I'll check up on Meals on Wheels    Orders Placed This Encounter  Procedures   POCT INR    Meds ordered this encounter  Medications   AMBULATORY NON FORMULARY MEDICATION    Sig: Motorized wheelchair per patient preference and insurance coverage. Fax to [facility]    Dispense:  1 Units    Refill:  99   carvedilol (COREG) 12.5 MG tablet    Sig: Take 1.5 tablets (18.75 mg total) by mouth 2 (two) times daily with a meal.    Dispense:  270 tablet    Refill:  3   lisinopril (ZESTRIL) 40 MG tablet    Sig: Take 1 tablet (40 mg total) by mouth daily.    Dispense:  90 tablet    Refill:  3     See below for relevant physical exam findings  See below for recent lab and imaging results reviewed  Medications, allergies, PMH, PSH, SocH, FamH reviewed below    Follow-up instructions: Return in about 4 weeks (around  06/27/2021) for VIRTUAL *OR* IN-OFFICE VISIT CHECK UP ON COPD AND OTHER MEDICAL ISSUES.                                        Exam:  BP (!) 147/68 (BP Location: Left Arm, Patient Position: Sitting, Cuff Size: Large)   Pulse 75   SpO2 96%  Constitutional: VS see above. General Appearance: alert, well-developed, well-nourished, NAD Neck: No masses, trachea midline.  Respiratory: Normal respiratory effort. no wheeze, (+)diffuse rhonchi, no rales Cardiovascular: S1/S2 normal, + murmur, no rub/gallop auscultated. RRR at this time no irreg/irreg Musculoskeletal: Symmetric and independent movement of all extremities Neurological: Normal balance/coordination. No tremor. Psychiatric: Normal judgment/insight. Normal mood and affect. Oriented x3.   Current Meds  Medication Sig   albuterol (VENTOLIN HFA) 108 (90 Base) MCG/ACT inhaler Inhale 1-2 puffs into the lungs every 6 (six) hours as needed for wheezing or shortness of breath.   AMBULATORY NON FORMULARY MEDICATION Home INR machine, Dx atrial fibrillation and long term anticoagulation with coumadin   AMBULATORY NON FORMULARY MEDICATION Motorized wheelchair per patient preference and insurance coverage. Fax to [facility]   aspirin EC 81 MG tablet Take 1 tablet (81 mg total) by mouth daily.   budesonide-formoterol (SYMBICORT) 160-4.5 MCG/ACT inhaler Inhale 2 puffs into the lungs  2 (two) times daily.   diltiazem (CARDIZEM) 30 MG tablet TAKE 1 TABLET BY MOUTH AS NEEDED   furosemide (LASIX) 40 MG tablet Take 0.5-1 tablets (20-40 mg total) by mouth daily.   guaiFENesin 200 MG tablet Take 1-2 tablets (200-400 mg total) by mouth every 6 (six) hours as needed for cough or to loosen phlegm.   ipratropium-albuterol (DUONEB) 0.5-2.5 (3) MG/3ML SOLN USE 1 VIAL IN NEBULIZER 5 TIMES DAILY   magnesium hydroxide (MILK OF MAGNESIA) 400 MG/5ML suspension Take 30 mLs by mouth as needed.   nitroGLYCERIN (NITROSTAT) 0.4 MG  SL tablet Place 1 tablet (0.4 mg total) under the tongue every 5 (five) minutes as needed for chest pain.   OXYGEN Inhale into the lungs daily.   warfarin (COUMADIN) 5 MG tablet Take 1 tablet (5 mg total) by mouth daily at 4 PM.   [DISCONTINUED] carvedilol (COREG) 12.5 MG tablet TAKE 1 & 1/2 (ONE & ONE-HALF) TABLETS BY MOUTH TWICE DAILY   [DISCONTINUED] lisinopril (ZESTRIL) 40 MG tablet Take 1 tablet by mouth once daily    Allergies  Allergen Reactions   Clopidogrel Other (See Comments)    "Purple blood pockets clots in mouth" Blood blisters in roof of mouth, urinary retention    Ticagrelor Shortness Of Breath and Other (See Comments)   Atorvastatin Other (See Comments)   Simvastatin    Statins Nausea And Vomiting and Other (See Comments)   Finasteride Other (See Comments)   Terazosin     Other reaction(s): Orthostatic hypotension    Patient Active Problem List   Diagnosis Date Noted   Hypertrophy of prostate without urinary obstruction and other lower urinary tract symptoms (LUTS) 02/01/2021   Tobacco use disorder 02/01/2021   DNR no code (do not resuscitate) 05/17/2020   Dizziness 04/26/2020   Shortness of breath 11/05/2019   Hyperglycemia 11/04/2019   Community acquired pneumonia of right lower lobe of lung 11/04/2019   Bradycardia 11/04/2019   Bilateral lower extremity edema 11/04/2019   CHF (congestive heart failure) ([place]) 06/23/2019   COPD (chronic obstructive pulmonary disease) with chronic bronchitis ([place]) 08/26/2018   Coronary artery disease of native artery of native heart with stable angina pectoris ([place]) 08/26/2018   Mixed hyperlipidemia 08/26/2018   Essential hypertension 08/26/2018   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   Tobacco use disorder, severe, dependence 08/26/2018   History of coronary artery stent placement 08/26/2018   White coat syndrome with hypertension 08/26/2018   Eyelid abnormality 08/26/2018   Lung nodule 08/26/2018   Abnormal findings on diagnostic  imaging of lung 08/26/2018    Family History  Problem Relation Age of Onset   High blood pressure Mother    Heart failure Mother    Lung cancer Father    High blood pressure Sister    Fibromyalgia Sister     Social History   Tobacco Use  Smoking Status Every Day   Packs/day: 0.50   Years: 65.00   Pack years: 32.50   Types: Cigarettes  Smokeless Tobacco Never    Past Surgical History:  Procedure Laterality Date   BLADDER TUMOR EXCISION     CORONARY ANGIOPLASTY WITH STENT PLACEMENT     PROSTATE SURGERY     TONSILLECTOMY       There is no immunization history on file for this patient.  Recent Results (from the past 2160 hour(s))  POCT INR     Status: None   Collection Time: 03/02/21  1:00 PM  Result Value Ref Range   INR 2.8  2 - 3  POCT INR     Status: Abnormal   Collection Time: 03/30/21 10:00 AM  Result Value Ref Range   INR 3.2 (A) 2 - 3  POCT INR     Status: Abnormal   Collection Time: 04/06/21 11:00 AM  Result Value Ref Range   INR 3.3 (A) 2 - 3  POCT INR     Status: None   Collection Time: 04/13/21 11:00 AM  Result Value Ref Range   INR 2.8 2 - 3  POCT INR     Status: None   Collection Time: 04/27/21 10:00 AM  Result Value Ref Range   INR 2.7 2 - 3  POCT INR     Status: None   Collection Time: 05/19/21 11:00 AM  Result Value Ref Range   INR 2.9 2 - 3  POCT INR     Status: Abnormal   Collection Time: 05/30/21 11:18 AM  Result Value Ref Range   INR 6.8 (A) 2.0 - 3.0    No results found.     All questions at time of visit were answered - patient instructed to contact office with any additional concerns or updates. ER/RTC precautions were reviewed with the patient as applicable.   Please note: manual typing as well as voice recognition software may have been used to produce this document - typos may escape review. Please contact Dr. Sheppard Coil for any needed clarifications.   Total encounter time on date of service, 05/30/21, was 40 minutes  spent addressing problems/issues as noted above in [place], including time spent in discussion with patient regarding the HPI, ROS, confirming history, reviewing Assessment & Plan, as well as time spent on coordination of care, record review.

## 2021-05-30 NOTE — Patient Instructions (Addendum)
Orders placed for manual wheelchair and motorized wheelchair  Will see how you do with physical therapy  Will send whatever the [place] needs - they may have more resources!  Refilled medications If swallowing problems, let me know and will get you in with GI  INR today HIGH - SKIP WARFARIN TONIGHT and I'll consult w/ Lysle Morales  I'll check up on Meals on Wheels

## 2021-06-01 ENCOUNTER — Ambulatory Visit (INDEPENDENT_AMBULATORY_CARE_PROVIDER_SITE_OTHER): Payer: Medicare (Managed Care) | Admitting: Pharmacist

## 2021-06-01 DIAGNOSIS — I480 Paroxysmal atrial fibrillation: Secondary | ICD-10-CM | POA: Diagnosis not present

## 2021-06-01 LAB — POCT INR
INR: 4.4 — AB (ref 0.9–1.1)
INR: 4.4 — AB (ref 2–3)

## 2021-06-01 NOTE — Progress Notes (Signed)
Subjective:    Christopher Armstrong is a 80 y.o. male here for follow-up of chronic anticoagulation for atrial fibrillation. Bleeding signs/symptoms:  None Thromboembolic signs/symptoms:  None  Missed Coumadin doses: Held dose x2, as instructed on 8/16, 8/17.  Medication changes: no Dietary changes: no Bacterial/viral infection: yes - recent covid Other concerns: no    Objective:    Current warfarin (COUMADIN) dose: previously '5mg'$  daily except 2.'5mg'$  on Thursdays. Supratherapeutic, held x2 doses. Lab Results  Component Value Date   INR 6.8 (A) 05/30/2021   INR 2.9 05/19/2021   INR 2.7 04/27/2021   INR 1.7 (H) 05/09/2020       Assessment:    Supratherapeutic INR for goal of 2-3    Plan:    Dose: HOLD tonights dose 06/01/21, then resume starting Friday 06/02/21 at 2.'5mg'$  (1/2 tablet) once daily. Next INR: 1 week

## 2021-06-02 ENCOUNTER — Ambulatory Visit: Payer: Medicare (Managed Care)

## 2021-06-05 DIAGNOSIS — J42 Unspecified chronic bronchitis: Secondary | ICD-10-CM | POA: Diagnosis not present

## 2021-06-08 ENCOUNTER — Ambulatory Visit (INDEPENDENT_AMBULATORY_CARE_PROVIDER_SITE_OTHER): Payer: Medicare (Managed Care) | Admitting: Pharmacist

## 2021-06-08 ENCOUNTER — Encounter: Payer: Self-pay | Admitting: Osteopathic Medicine

## 2021-06-08 DIAGNOSIS — I480 Paroxysmal atrial fibrillation: Secondary | ICD-10-CM

## 2021-06-08 LAB — POCT INR: INR: 1.9 — AB (ref 2–3)

## 2021-06-08 NOTE — Progress Notes (Signed)
Subjective:    Christopher Armstrong is a 80 y.o. male here for follow-up of chronic anticoagulation for atrial fibrillation. Bleeding signs/symptoms:  None Thromboembolic signs/symptoms:  None  Missed Coumadin doses: None Medication changes: no Dietary changes: no Bacterial/viral infection: no Other concerns: no    Objective:    Current warfarin (COUMADIN) dose: Held dose x2, then resumed at 1/2 tablet (2.'5mg'$ ) daily. Lab Results  Component Value Date   INR 1.9 (A) 06/08/2021   INR 4.4 (A) 06/01/2021   INR 6.8 (A) 05/30/2021   INR 1.7 (H) 05/09/2020       Assessment:    Subtherapeutic INR for goal of 2-3    Plan:    Dose: Take '5mg'$  (1 tablet) on Thursdays, and 2.'5mg'$  (1/2 tablet) on all other days. Next INR: 1 week

## 2021-06-09 ENCOUNTER — Telehealth: Payer: Self-pay | Admitting: *Deleted

## 2021-06-09 NOTE — Telephone Encounter (Signed)
Helene Kelp, nurse from [facility], left vm wanting VO that you will continue to write orders for the pt.  Please advise.

## 2021-06-12 ENCOUNTER — Telehealth: Payer: Self-pay | Admitting: Osteopathic Medicine

## 2021-06-12 MED ORDER — AMBULATORY NON FORMULARY MEDICATION: 99 refills | Status: DC

## 2021-06-12 NOTE — Telephone Encounter (Signed)
Pt needs a Order for Wheelchair with CPT code and for Manual or electric. Please send to [place] and Phone [phone].  Fax [phone]

## 2021-06-13 NOTE — Telephone Encounter (Signed)
Verbal orders also given for social work eval.

## 2021-06-15 ENCOUNTER — Ambulatory Visit (INDEPENDENT_AMBULATORY_CARE_PROVIDER_SITE_OTHER): Payer: Medicare (Managed Care) | Admitting: Pharmacist

## 2021-06-15 DIAGNOSIS — I480 Paroxysmal atrial fibrillation: Secondary | ICD-10-CM

## 2021-06-15 LAB — POCT INR: INR: 2.2 (ref 2–3)

## 2021-06-15 NOTE — Progress Notes (Signed)
Subjective:    Christopher Armstrong is a 80 y.o. male here for follow-up of chronic anticoagulation for atrial fibrillation. Bleeding signs/symptoms:  None Thromboembolic signs/symptoms:  None  Missed Coumadin doses: None Medication changes: no Dietary changes: no Bacterial/viral infection: no Other concerns: no    Objective:    Current warfarin (COUMADIN) dose: Warfarin '5mg'$  on Thursdays, 2.'5mg'$  all other days. Lab Results  Component Value Date   INR 2.2 06/15/2021   INR 1.9 (A) 06/08/2021   INR 4.4 (A) 06/01/2021   INR 4.4 (A) 06/01/2021   INR 1.7 (H) 05/09/2020       Assessment:    Therapeutic INR for goal of 2-3    Plan:    Dose: no change Next INR: 1 week

## 2021-06-16 DIAGNOSIS — Z9981 Dependence on supplemental oxygen: Secondary | ICD-10-CM | POA: Diagnosis not present

## 2021-06-16 DIAGNOSIS — J449 Chronic obstructive pulmonary disease, unspecified: Secondary | ICD-10-CM | POA: Diagnosis not present

## 2021-06-16 DIAGNOSIS — Z7951 Long term (current) use of inhaled steroids: Secondary | ICD-10-CM | POA: Diagnosis not present

## 2021-06-16 DIAGNOSIS — Z7901 Long term (current) use of anticoagulants: Secondary | ICD-10-CM | POA: Diagnosis not present

## 2021-06-16 DIAGNOSIS — Z955 Presence of coronary angioplasty implant and graft: Secondary | ICD-10-CM | POA: Diagnosis not present

## 2021-06-16 DIAGNOSIS — Z7982 Long term (current) use of aspirin: Secondary | ICD-10-CM | POA: Diagnosis not present

## 2021-06-16 DIAGNOSIS — F17210 Nicotine dependence, cigarettes, uncomplicated: Secondary | ICD-10-CM | POA: Diagnosis not present

## 2021-06-16 DIAGNOSIS — E782 Mixed hyperlipidemia: Secondary | ICD-10-CM | POA: Diagnosis not present

## 2021-06-16 DIAGNOSIS — Z5181 Encounter for therapeutic drug level monitoring: Secondary | ICD-10-CM | POA: Diagnosis not present

## 2021-06-16 DIAGNOSIS — Z8701 Personal history of pneumonia (recurrent): Secondary | ICD-10-CM | POA: Diagnosis not present

## 2021-06-16 DIAGNOSIS — R911 Solitary pulmonary nodule: Secondary | ICD-10-CM | POA: Diagnosis not present

## 2021-06-16 DIAGNOSIS — Z79899 Other long term (current) drug therapy: Secondary | ICD-10-CM | POA: Diagnosis not present

## 2021-06-16 DIAGNOSIS — I25118 Atherosclerotic heart disease of native coronary artery with other forms of angina pectoris: Secondary | ICD-10-CM | POA: Diagnosis not present

## 2021-06-22 ENCOUNTER — Ambulatory Visit (INDEPENDENT_AMBULATORY_CARE_PROVIDER_SITE_OTHER): Payer: Medicare (Managed Care) | Admitting: Pharmacist

## 2021-06-22 ENCOUNTER — Other Ambulatory Visit: Payer: Self-pay

## 2021-06-22 DIAGNOSIS — I480 Paroxysmal atrial fibrillation: Secondary | ICD-10-CM | POA: Diagnosis not present

## 2021-06-22 LAB — POCT INR: INR: 2.4 (ref 2–3)

## 2021-06-22 NOTE — Progress Notes (Signed)
Subjective:    Christopher Armstrong is a 80 y.o. male here for follow-up of chronic anticoagulation for atrial fibrillation. Bleeding signs/symptoms:  None Thromboembolic signs/symptoms:  None  Missed Coumadin doses: None Medication changes: no Dietary changes: no Bacterial/viral infection: no Other concerns: no   Objective:    Current warfarin (COUMADIN) dose: Warfarin '5mg'$  on Thursdays, 2.'5mg'$  all other days. Lab Results  Component Value Date   INR 2.2 06/15/2021   INR 1.9 (A) 06/08/2021   INR 4.4 (A) 06/01/2021   INR 4.4 (A) 06/01/2021   INR 1.7 (H) 05/09/2020       Assessment:    Therapeutic INR for goal of 2-3    Plan:    Dose: no change Next INR: 1 week

## 2021-06-27 ENCOUNTER — Telehealth (INDEPENDENT_AMBULATORY_CARE_PROVIDER_SITE_OTHER): Payer: Medicare (Managed Care) | Admitting: Osteopathic Medicine

## 2021-06-27 ENCOUNTER — Encounter: Payer: Self-pay | Admitting: Osteopathic Medicine

## 2021-06-27 DIAGNOSIS — J449 Chronic obstructive pulmonary disease, unspecified: Secondary | ICD-10-CM

## 2021-06-27 DIAGNOSIS — I25118 Atherosclerotic heart disease of native coronary artery with other forms of angina pectoris: Secondary | ICD-10-CM | POA: Diagnosis not present

## 2021-06-27 DIAGNOSIS — R5381 Other malaise: Secondary | ICD-10-CM | POA: Diagnosis not present

## 2021-06-27 DIAGNOSIS — I10 Essential (primary) hypertension: Secondary | ICD-10-CM

## 2021-06-27 DIAGNOSIS — F17200 Nicotine dependence, unspecified, uncomplicated: Secondary | ICD-10-CM | POA: Diagnosis not present

## 2021-06-27 NOTE — Progress Notes (Signed)
Telemedicine Visit via  Audio only - telephone (patient preference /  technical difficulty with MyChart video application)  I connected with Christopher Armstrong on 06/27/21 at 10:53 AM  by phone or  telemedicine application as noted above  I verified that I am speaking with or regarding  the correct patient using two identifiers.  Participants: Myself, Dr [name] DO Patient: Christopher Armstrong Patient proxy if applicable: NONE Other, if applicable: NONE  Patient is at home I am in office at [facility]    I discussed the limitations of evaluation and management  by telemedicine and the availability of in person appointments.  The participant(s) above expressed understanding and  agreed to proceed with this appointment via telemedicine.       History of Present Illness: VEDH BRITO is a 80 y.o. male who would like to discuss follow up COPD, home health, wheelchair.   Patient states someone has come out to his home to take some measurements for him motorized wheelchair, was told maybe a couple of months before he would be able to get the chair itself  Has not heard back yet about Meals on Wheels.  He notes appetite is good and has gained some weight back since his most recent realization/COVID illness  COPD is stable, no concerns with increased cough, shortness of breath, fever.          Observations/Objective: There were no vitals taken for this visit. BP Readings from Last 3 Encounters:  05/30/21 (!) 147/68  03/31/21 (!) 148/72  12/01/20 (!) 200/71   Exam: Normal Speech.  NAD  Lab and Radiology Results No results found for this or any previous visit (from the past 72 hour(s)). No results found.     Assessment and Plan: 80 y.o. male with The primary encounter diagnosis was COPD (chronic obstructive pulmonary disease) with chronic bronchitis ([place]). Diagnoses of Coronary artery disease of native artery of native heart with stable angina  pectoris ([place]), White coat syndrome with hypertension, Tobacco use disorder, severe, dependence, and Debility were also pertinent to this visit.  Chronic conditions are stable Continue current medications Patient not eligible for social worker consult, not under [facility], he has upcoming appoint with the [place], would recommend discuss with regarding other community resources that he might be able to utilize. The patient was encouraged to reach out to Meals on Wheels directly, I am not familiar with any paperwork that would be necessary from [place] for him to obtain this service, but will be happy to help depending what they need from [place] if anything  PDMP not reviewed this encounter. No orders of the defined types were placed in this encounter.  No orders of the defined types were placed in this encounter.  There are no Patient Instructions on file for this visit.  Instructions sent via MyChart.   Follow Up Instructions: Return in about 3 months (around 09/26/2021) for ESTABLISH W/ DR MATTHEWS ([id number]), in office if possible .    I discussed the assessment and treatment plan with the patient. The patient was provided an opportunity to ask questions and all were answered. The patient agreed with the plan and demonstrated an understanding of the instructions.   The patient was advised to call back or seek an in-person evaluation if any new concerns, if symptoms worsen or if the condition fails to improve as anticipated.  21 minutes of non-face-to-face time was provided during this encounter.      . . . . . . . . . . . . . Marland Kitchen  Historical information moved to improve visibility of documentation.  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   Tobacco use disorder, severe, dependence 08/26/2018   Past Surgical History:  Procedure Laterality Date   BLADDER TUMOR EXCISION     CORONARY ANGIOPLASTY WITH STENT PLACEMENT     PROSTATE SURGERY     TONSILLECTOMY     Social History   Tobacco Use   Smoking status: Every Day    Packs/day: 0.50    Years: 65.00    Pack years: 32.50    Types: Cigarettes   Smokeless tobacco: Never  Substance Use Topics   Alcohol use: Not Currently    Alcohol/week: 0.0 standard drinks    Comment: 0   family history includes Fibromyalgia in his sister; Heart failure in his mother; High blood pressure in his mother and sister; Lung cancer in his father.  Medications: Current Outpatient Medications  Medication Sig Dispense Refill   albuterol (VENTOLIN HFA) 108 (90 Base) MCG/ACT inhaler Inhale 1-2 puffs into the lungs every 6 (six) hours as needed for wheezing or shortness of breath. 18 g 99   AMBULATORY NON FORMULARY MEDICATION Home INR machine, Dx atrial fibrillation and long term anticoagulation with coumadin 1 Units 99   AMBULATORY NON FORMULARY MEDICATION Meals on Wheels service. Dx: COVID pneumonia, COPD, debility, Afib 1 Units 26   AMBULATORY NON FORMULARY MEDICATION Patient suffers from copd and arial fibrillation and debility d/t recent hospitalization which impairs their ability to perform daily activities like bathing, dressing, grooming, and toileting in the home.  A cane, crutch, or walker will not resolve issue with performing activities of daily living. A wheelchair will allow patient to safely perform daily activities. Patient can safely propel the wheelchair in the home or has a caregiver who can provide assistance. Length of need Lifetime. Accessories: elevating leg rests  (ELRs), wheel locks, extensions and anti-tippers. 1 Units 99   AMBULATORY NON FORMULARY MEDICATION Motorized wheelchair per patient preference and insurance coverage. Patient suffers from copd and arial fibrillation and debility d/t recent hospitalization which impairs their ability to perform daily activities like bathing, dressing, grooming, and toileting in the home.  A cane, crutch, or walker will not resolve issue with performing activities of daily living. A wheelchair will allow patient to safely perform daily activities. Patient can safely propel the wheelchair in the home or has a caregiver who can provide assistance. Length of need Lifetime. Accessories: elevating leg rests (ELRs), wheel locks, extensions and anti-tippers. Fax [phone] 1 Units 99   aspirin EC 81 MG tablet Take 1 tablet (81 mg total) by mouth daily. 90 tablet 3   budesonide-formoterol (SYMBICORT) 160-4.5 MCG/ACT inhaler Inhale 2 puffs into the lungs 2 (two) times daily. 3 Inhaler 99   carvedilol (COREG) 12.5 MG tablet Take 1.5 tablets (18.75 mg total) by mouth 2 (two) times daily with a meal. 270 tablet 3   diltiazem (CARDIZEM) 30 MG tablet TAKE 1 TABLET BY MOUTH AS NEEDED 90 tablet 2   furosemide (LASIX) 40 MG tablet Take 0.5-1 tablets (20-40 mg total) by mouth daily. 90 tablet 1  ipratropium-albuterol (DUONEB) 0.5-2.5 (3) MG/3ML SOLN USE 1 VIAL IN NEBULIZER 5 TIMES DAILY 150 mL 11   lisinopril (ZESTRIL) 40 MG tablet Take 1 tablet (40 mg total) by mouth daily. 90 tablet 3   magnesium hydroxide (MILK OF MAGNESIA) 400 MG/5ML suspension Take 30 mLs by mouth as needed.     nitroGLYCERIN (NITROSTAT) 0.4 MG SL tablet Place 1 tablet (0.4 mg total) under the tongue every 5 (five) minutes as needed for chest pain. 28 tablet 1   OXYGEN Inhale into the lungs daily.     pantoprazole (PROTONIX) 40 MG tablet Take 40 mg by mouth 2 (two) times daily.     guaiFENesin 200 MG tablet Take 1-2 tablets (200-400 mg total) by mouth every 6 (six)  hours as needed for cough or to loosen phlegm. (Patient not taking: Reported on 06/27/2021) 90 tablet 1   warfarin (COUMADIN) 5 MG tablet Take 1 tablet (5 mg total) by mouth daily at 4 PM. 90 tablet 1   No current facility-administered medications for this visit.   Allergies  Allergen Reactions   Clopidogrel Other (See Comments)    "Purple blood pockets clots in mouth" Blood blisters in roof of mouth, urinary retention    Ticagrelor Shortness Of Breath and Other (See Comments)   Atorvastatin Other (See Comments)   Simvastatin    Statins Nausea And Vomiting and Other (See Comments)   Finasteride Other (See Comments)   Terazosin     Other reaction(s): Orthostatic hypotension     If phone visit, billing and coding can please add appropriate modifier if needed

## 2021-06-28 ENCOUNTER — Telehealth: Payer: Medicare (Managed Care) | Admitting: Osteopathic Medicine

## 2021-06-28 ENCOUNTER — Telehealth: Payer: Self-pay

## 2021-06-28 DIAGNOSIS — F17210 Nicotine dependence, cigarettes, uncomplicated: Secondary | ICD-10-CM | POA: Diagnosis not present

## 2021-06-28 DIAGNOSIS — Z9981 Dependence on supplemental oxygen: Secondary | ICD-10-CM | POA: Diagnosis not present

## 2021-06-28 DIAGNOSIS — E782 Mixed hyperlipidemia: Secondary | ICD-10-CM | POA: Diagnosis not present

## 2021-06-28 DIAGNOSIS — I25118 Atherosclerotic heart disease of native coronary artery with other forms of angina pectoris: Secondary | ICD-10-CM | POA: Diagnosis not present

## 2021-06-28 DIAGNOSIS — Z7982 Long term (current) use of aspirin: Secondary | ICD-10-CM | POA: Diagnosis not present

## 2021-06-28 DIAGNOSIS — R911 Solitary pulmonary nodule: Secondary | ICD-10-CM | POA: Diagnosis not present

## 2021-06-28 DIAGNOSIS — Z5181 Encounter for therapeutic drug level monitoring: Secondary | ICD-10-CM | POA: Diagnosis not present

## 2021-06-28 DIAGNOSIS — J449 Chronic obstructive pulmonary disease, unspecified: Secondary | ICD-10-CM | POA: Diagnosis not present

## 2021-06-28 DIAGNOSIS — Z8701 Personal history of pneumonia (recurrent): Secondary | ICD-10-CM | POA: Diagnosis not present

## 2021-06-28 DIAGNOSIS — J42 Unspecified chronic bronchitis: Secondary | ICD-10-CM | POA: Diagnosis not present

## 2021-06-28 DIAGNOSIS — Z7951 Long term (current) use of inhaled steroids: Secondary | ICD-10-CM | POA: Diagnosis not present

## 2021-06-28 DIAGNOSIS — Z79899 Other long term (current) drug therapy: Secondary | ICD-10-CM | POA: Diagnosis not present

## 2021-06-28 DIAGNOSIS — Z955 Presence of coronary angioplasty implant and graft: Secondary | ICD-10-CM | POA: Diagnosis not present

## 2021-06-28 DIAGNOSIS — Z7901 Long term (current) use of anticoagulants: Secondary | ICD-10-CM | POA: Diagnosis not present

## 2021-06-28 NOTE — Telephone Encounter (Signed)
I do not see any reason not to switch him to this, would possibly anticipate him needing patient assistance for this, what you think?  He also should have some sort of assistance through the [place] I believe, may want to consider having him reach out to them as well?  Thanks!

## 2021-06-28 NOTE — Telephone Encounter (Signed)
As requested, contacted the patient. He is requesting if provider would consider switching him from warfarin to apixaban. The recommendation came from [facility] nurse. Per patient, he wants to simplify his INR checks (currently has INR checked wkly). Patient apologized for not bringing up the topic during his visit on 06/27/21. Pls advise, thanks.

## 2021-06-28 NOTE — Telephone Encounter (Signed)
Christopher Armstrong left a message stating he wants Christopher Armstrong to call him back.

## 2021-06-29 ENCOUNTER — Ambulatory Visit (INDEPENDENT_AMBULATORY_CARE_PROVIDER_SITE_OTHER): Payer: Medicare (Managed Care) | Admitting: Pharmacist

## 2021-06-29 DIAGNOSIS — I480 Paroxysmal atrial fibrillation: Secondary | ICD-10-CM

## 2021-06-29 LAB — POCT INR: INR: 1.4 — AB (ref 2–3)

## 2021-06-29 NOTE — Progress Notes (Signed)
Subjective:    Christopher Armstrong is a 80 y.o. male here for follow-up of chronic anticoagulation for atrial fibrillation. Bleeding signs/symptoms:  None Thromboembolic signs/symptoms:  None  Missed Coumadin doses: None Medication changes: no Dietary changes: no Bacterial/viral infection: no Other concerns: no   Objective:    Current warfarin (COUMADIN) dose: Warfarin '5mg'$  on Thurs, 2.'5mg'$  (1/2 tab) all other days Lab Results  Component Value Date   INR 1.4 (A) 06/29/2021   INR 2.4 06/22/2021   INR 2.2 06/15/2021   INR 4.4 (A) 06/01/2021   INR 1.7 (H) 05/09/2020       Assessment:    Subtherapeutic INR for goal of 2-3    Plan:    Dose: Take '5mg'$  on Tues, Thurs, Sat and 2.'5mg'$  (1/2 tab) all other days Patient expressed interest in apixaban. Discussed that at "cheapest" it is usually ~$47 per month with most insurance plans. If he can get connected through the [place] (& fill meds through [place]), it is on their formulary and essentially covered.Patient requested I review cost at his current pharmacy - attempted to call pharmacy but was closed for lunch. Will have ongoing discussion about transition to apixaban. Next INR: 1 week

## 2021-07-03 ENCOUNTER — Other Ambulatory Visit: Payer: Self-pay | Admitting: Osteopathic Medicine

## 2021-07-06 ENCOUNTER — Ambulatory Visit (INDEPENDENT_AMBULATORY_CARE_PROVIDER_SITE_OTHER): Payer: Medicare (Managed Care) | Admitting: Pharmacist

## 2021-07-06 ENCOUNTER — Telehealth: Payer: Self-pay

## 2021-07-06 ENCOUNTER — Other Ambulatory Visit: Payer: Self-pay

## 2021-07-06 DIAGNOSIS — I480 Paroxysmal atrial fibrillation: Secondary | ICD-10-CM

## 2021-07-06 LAB — POCT INR: INR: 1.6 — AB (ref 2–3)

## 2021-07-06 MED ORDER — APIXABAN 5 MG PO TABS: 5.0000 mg | ORAL_TABLET | Freq: Two times a day (BID) | ORAL | 2 refills | Status: DC

## 2021-07-06 NOTE — Progress Notes (Signed)
Subjective:    Christopher Armstrong is a 80 y.o. male here for follow-up of chronic anticoagulation for atrial fibrillation. Bleeding signs/symptoms:  None Thromboembolic signs/symptoms:  None  Missed Coumadin doses: None Medication changes: no Dietary changes: no Bacterial/viral infection: no Other concerns: no    Objective:    Current warfarin (COUMADIN) dose: Warfarin 5mg  on Tues, Thurs, Sat and 2.5mg  on all other days. Lab Results  Component Value Date   INR 1.6 (A) 07/06/2021   INR 1.4 (A) 06/29/2021   INR 2.4 06/22/2021   INR 4.4 (A) 06/01/2021   INR 1.7 (H) 05/09/2020       Assessment:    Subtherapeutic INR for goal of 2-3    Plan:    Dose: STOP warfarin at this time. Patient investigated insurance coverage and is now okay with $47 copay for transition to eliquis. Will begin Eliquis 5mg  BID, counseled patient through transition (safe as patient INR already subtherapeutic), to STOP warfarin as of today and begin Eliquis 5mg  BID when he picks up RX from pharmacy. Also discussed s/sx of bleeding & when to seek medical attention. Patient is in between PCP's right now, I have scheduled a pharmacy follow up for ~3 weeks out and advised him to call us sooner if any issues.  Next INR: Can discontinue INR checks at this time. Patient is thrilled.

## 2021-07-06 NOTE — Telephone Encounter (Signed)
Patient would like to switch from warfarin. Per notes, Pharmacist was working on the issue.   Pt requesting callback concerning medication switch. States he has not heard anything and is aware Dr. Sheppard Coil has left.  Forwarding message to Larinda Buttery for follow-up.

## 2021-07-06 NOTE — Telephone Encounter (Signed)
Received call from Westley Foots requesting PT verbal orders.   1 wk x 2 2 wk x 3 1 wk x 2  Verbal orders given. Documentation being faxed for signature.   Effective date of orders to be retro: 06/30/21.  Orders being sent to Dr. West Pugh due to covering for [facility] today.

## 2021-07-07 ENCOUNTER — Other Ambulatory Visit: Payer: Self-pay | Admitting: Medical-Surgical

## 2021-07-11 DIAGNOSIS — Z8701 Personal history of pneumonia (recurrent): Secondary | ICD-10-CM | POA: Diagnosis not present

## 2021-07-11 DIAGNOSIS — F17210 Nicotine dependence, cigarettes, uncomplicated: Secondary | ICD-10-CM | POA: Diagnosis not present

## 2021-07-11 DIAGNOSIS — J449 Chronic obstructive pulmonary disease, unspecified: Secondary | ICD-10-CM | POA: Diagnosis not present

## 2021-07-11 DIAGNOSIS — Z79899 Other long term (current) drug therapy: Secondary | ICD-10-CM | POA: Diagnosis not present

## 2021-07-11 DIAGNOSIS — Z9981 Dependence on supplemental oxygen: Secondary | ICD-10-CM | POA: Diagnosis not present

## 2021-07-11 DIAGNOSIS — Z955 Presence of coronary angioplasty implant and graft: Secondary | ICD-10-CM | POA: Diagnosis not present

## 2021-07-11 DIAGNOSIS — R911 Solitary pulmonary nodule: Secondary | ICD-10-CM | POA: Diagnosis not present

## 2021-07-11 DIAGNOSIS — Z7951 Long term (current) use of inhaled steroids: Secondary | ICD-10-CM | POA: Diagnosis not present

## 2021-07-11 DIAGNOSIS — E782 Mixed hyperlipidemia: Secondary | ICD-10-CM | POA: Diagnosis not present

## 2021-07-11 DIAGNOSIS — Z5181 Encounter for therapeutic drug level monitoring: Secondary | ICD-10-CM | POA: Diagnosis not present

## 2021-07-11 DIAGNOSIS — Z7901 Long term (current) use of anticoagulants: Secondary | ICD-10-CM | POA: Diagnosis not present

## 2021-07-11 DIAGNOSIS — I25118 Atherosclerotic heart disease of native coronary artery with other forms of angina pectoris: Secondary | ICD-10-CM | POA: Diagnosis not present

## 2021-07-11 DIAGNOSIS — Z7982 Long term (current) use of aspirin: Secondary | ICD-10-CM | POA: Diagnosis not present

## 2021-07-13 DIAGNOSIS — Z79899 Other long term (current) drug therapy: Secondary | ICD-10-CM | POA: Diagnosis not present

## 2021-07-13 DIAGNOSIS — Z7982 Long term (current) use of aspirin: Secondary | ICD-10-CM | POA: Diagnosis not present

## 2021-07-13 DIAGNOSIS — Z955 Presence of coronary angioplasty implant and graft: Secondary | ICD-10-CM | POA: Diagnosis not present

## 2021-07-13 DIAGNOSIS — R911 Solitary pulmonary nodule: Secondary | ICD-10-CM | POA: Diagnosis not present

## 2021-07-13 DIAGNOSIS — Z9981 Dependence on supplemental oxygen: Secondary | ICD-10-CM | POA: Diagnosis not present

## 2021-07-13 DIAGNOSIS — F17210 Nicotine dependence, cigarettes, uncomplicated: Secondary | ICD-10-CM | POA: Diagnosis not present

## 2021-07-13 DIAGNOSIS — I25118 Atherosclerotic heart disease of native coronary artery with other forms of angina pectoris: Secondary | ICD-10-CM | POA: Diagnosis not present

## 2021-07-13 DIAGNOSIS — Z5181 Encounter for therapeutic drug level monitoring: Secondary | ICD-10-CM | POA: Diagnosis not present

## 2021-07-13 DIAGNOSIS — Z7951 Long term (current) use of inhaled steroids: Secondary | ICD-10-CM | POA: Diagnosis not present

## 2021-07-13 DIAGNOSIS — E782 Mixed hyperlipidemia: Secondary | ICD-10-CM | POA: Diagnosis not present

## 2021-07-13 DIAGNOSIS — Z7901 Long term (current) use of anticoagulants: Secondary | ICD-10-CM | POA: Diagnosis not present

## 2021-07-13 DIAGNOSIS — Z8701 Personal history of pneumonia (recurrent): Secondary | ICD-10-CM | POA: Diagnosis not present

## 2021-07-13 DIAGNOSIS — J449 Chronic obstructive pulmonary disease, unspecified: Secondary | ICD-10-CM | POA: Diagnosis not present

## 2021-07-14 DIAGNOSIS — Z79899 Other long term (current) drug therapy: Secondary | ICD-10-CM | POA: Diagnosis not present

## 2021-07-14 DIAGNOSIS — Z7901 Long term (current) use of anticoagulants: Secondary | ICD-10-CM | POA: Diagnosis not present

## 2021-07-14 DIAGNOSIS — Z7951 Long term (current) use of inhaled steroids: Secondary | ICD-10-CM | POA: Diagnosis not present

## 2021-07-14 DIAGNOSIS — Z955 Presence of coronary angioplasty implant and graft: Secondary | ICD-10-CM | POA: Diagnosis not present

## 2021-07-14 DIAGNOSIS — Z8701 Personal history of pneumonia (recurrent): Secondary | ICD-10-CM | POA: Diagnosis not present

## 2021-07-14 DIAGNOSIS — Z7982 Long term (current) use of aspirin: Secondary | ICD-10-CM | POA: Diagnosis not present

## 2021-07-14 DIAGNOSIS — Z5181 Encounter for therapeutic drug level monitoring: Secondary | ICD-10-CM | POA: Diagnosis not present

## 2021-07-14 DIAGNOSIS — R911 Solitary pulmonary nodule: Secondary | ICD-10-CM | POA: Diagnosis not present

## 2021-07-14 DIAGNOSIS — F17210 Nicotine dependence, cigarettes, uncomplicated: Secondary | ICD-10-CM | POA: Diagnosis not present

## 2021-07-14 DIAGNOSIS — Z9981 Dependence on supplemental oxygen: Secondary | ICD-10-CM | POA: Diagnosis not present

## 2021-07-14 DIAGNOSIS — J449 Chronic obstructive pulmonary disease, unspecified: Secondary | ICD-10-CM | POA: Diagnosis not present

## 2021-07-14 DIAGNOSIS — I25118 Atherosclerotic heart disease of native coronary artery with other forms of angina pectoris: Secondary | ICD-10-CM | POA: Diagnosis not present

## 2021-07-14 DIAGNOSIS — E782 Mixed hyperlipidemia: Secondary | ICD-10-CM | POA: Diagnosis not present

## 2021-07-17 DIAGNOSIS — Z5181 Encounter for therapeutic drug level monitoring: Secondary | ICD-10-CM | POA: Diagnosis not present

## 2021-07-17 DIAGNOSIS — Z8701 Personal history of pneumonia (recurrent): Secondary | ICD-10-CM | POA: Diagnosis not present

## 2021-07-17 DIAGNOSIS — Z9981 Dependence on supplemental oxygen: Secondary | ICD-10-CM | POA: Diagnosis not present

## 2021-07-17 DIAGNOSIS — Z955 Presence of coronary angioplasty implant and graft: Secondary | ICD-10-CM | POA: Diagnosis not present

## 2021-07-17 DIAGNOSIS — Z7982 Long term (current) use of aspirin: Secondary | ICD-10-CM | POA: Diagnosis not present

## 2021-07-17 DIAGNOSIS — R911 Solitary pulmonary nodule: Secondary | ICD-10-CM | POA: Diagnosis not present

## 2021-07-17 DIAGNOSIS — F17210 Nicotine dependence, cigarettes, uncomplicated: Secondary | ICD-10-CM | POA: Diagnosis not present

## 2021-07-17 DIAGNOSIS — I25118 Atherosclerotic heart disease of native coronary artery with other forms of angina pectoris: Secondary | ICD-10-CM | POA: Diagnosis not present

## 2021-07-17 DIAGNOSIS — Z7901 Long term (current) use of anticoagulants: Secondary | ICD-10-CM | POA: Diagnosis not present

## 2021-07-17 DIAGNOSIS — E782 Mixed hyperlipidemia: Secondary | ICD-10-CM | POA: Diagnosis not present

## 2021-07-17 DIAGNOSIS — Z79899 Other long term (current) drug therapy: Secondary | ICD-10-CM | POA: Diagnosis not present

## 2021-07-17 DIAGNOSIS — J449 Chronic obstructive pulmonary disease, unspecified: Secondary | ICD-10-CM | POA: Diagnosis not present

## 2021-07-17 DIAGNOSIS — Z7951 Long term (current) use of inhaled steroids: Secondary | ICD-10-CM | POA: Diagnosis not present

## 2021-07-21 DIAGNOSIS — Z955 Presence of coronary angioplasty implant and graft: Secondary | ICD-10-CM | POA: Diagnosis not present

## 2021-07-21 DIAGNOSIS — E782 Mixed hyperlipidemia: Secondary | ICD-10-CM | POA: Diagnosis not present

## 2021-07-21 DIAGNOSIS — J449 Chronic obstructive pulmonary disease, unspecified: Secondary | ICD-10-CM | POA: Diagnosis not present

## 2021-07-21 DIAGNOSIS — Z9981 Dependence on supplemental oxygen: Secondary | ICD-10-CM | POA: Diagnosis not present

## 2021-07-21 DIAGNOSIS — Z7982 Long term (current) use of aspirin: Secondary | ICD-10-CM | POA: Diagnosis not present

## 2021-07-21 DIAGNOSIS — F17210 Nicotine dependence, cigarettes, uncomplicated: Secondary | ICD-10-CM | POA: Diagnosis not present

## 2021-07-21 DIAGNOSIS — Z7901 Long term (current) use of anticoagulants: Secondary | ICD-10-CM | POA: Diagnosis not present

## 2021-07-21 DIAGNOSIS — Z79899 Other long term (current) drug therapy: Secondary | ICD-10-CM | POA: Diagnosis not present

## 2021-07-21 DIAGNOSIS — Z8701 Personal history of pneumonia (recurrent): Secondary | ICD-10-CM | POA: Diagnosis not present

## 2021-07-21 DIAGNOSIS — R911 Solitary pulmonary nodule: Secondary | ICD-10-CM | POA: Diagnosis not present

## 2021-07-21 DIAGNOSIS — I25118 Atherosclerotic heart disease of native coronary artery with other forms of angina pectoris: Secondary | ICD-10-CM | POA: Diagnosis not present

## 2021-07-21 DIAGNOSIS — Z5181 Encounter for therapeutic drug level monitoring: Secondary | ICD-10-CM | POA: Diagnosis not present

## 2021-07-21 DIAGNOSIS — Z7951 Long term (current) use of inhaled steroids: Secondary | ICD-10-CM | POA: Diagnosis not present

## 2021-07-25 ENCOUNTER — Ambulatory Visit (INDEPENDENT_AMBULATORY_CARE_PROVIDER_SITE_OTHER): Payer: Medicare (Managed Care) | Admitting: Pharmacist

## 2021-07-25 ENCOUNTER — Other Ambulatory Visit: Payer: Self-pay

## 2021-07-25 DIAGNOSIS — I10 Essential (primary) hypertension: Secondary | ICD-10-CM

## 2021-07-25 DIAGNOSIS — J449 Chronic obstructive pulmonary disease, unspecified: Secondary | ICD-10-CM

## 2021-07-25 DIAGNOSIS — I480 Paroxysmal atrial fibrillation: Secondary | ICD-10-CM

## 2021-07-25 NOTE — Patient Instructions (Signed)
Visit Information   PATIENT GOALS:   Goals Addressed             This Visit's Progress    Medication Management       Patient Goals/Self-Care Activities Over the next 90 days, patient will:  take medications as prescribed  Follow Up Plan: Telephone follow up appointment with care management team member scheduled for:  3 months         Consent to CCM Services: Christopher Armstrong was given information about Chronic Care Management services including:  CCM service includes personalized support from designated clinical staff supervised by his physician, including individualized plan of care and coordination with other care providers 24/7 contact phone numbers for assistance for urgent and routine care needs. Service will only be billed when office clinical staff spend 20 minutes or more in a month to coordinate care. Only one practitioner may furnish and bill the service in a calendar month. The patient may stop CCM services at any time (effective at the end of the month) by phone call to the office staff. The patient will be responsible for cost sharing (co-pay) of up to 20% of the service fee (after annual deductible is met).  Patient agreed to services and verbal consent obtained.   The patient verbalized understanding of instructions, educational materials, and care plan provided today and agreed to receive a mailed copy of patient instructions, educational materials, and care plan.   Telephone follow up appointment with care management team member scheduled for: 3 months  Christopher Armstrong   CLINICAL CARE PLAN: Patient Care Plan: Medication Management     Problem Identified: HTN, COPD, Afib      Long-Range Goal: Disease Progression Prevention   Start Date: 07/25/2021  This Visit's Progress: On track  Priority: High  Note:   Current Barriers:  None at present  Pharmacist Clinical Goal(s):  Over the next 90 days, patient will maintain control of chronic conditions as  evidenced by medication fill history, lab values, and vital signs  through collaboration with PharmD and provider.   Interventions: 1:1 collaboration with [name], DO regarding development and update of comprehensive plan of care as evidenced by provider attestation and co-signature Inter-disciplinary care team collaboration (see longitudinal plan of care) Comprehensive medication review performed; medication list updated in electronic medical record  Hypertension:  Controlled; current treatment:lisinopril 321m daily, diltiazem 390mPRN, coreg 12.21m421mID; furosemide 54m621m Current home readings: SBP 145-155 per home health nurse, and SBP 120s in AM  Denies hypotensive/hypertensive symptoms  Recommended continue current regimen,  Chronic Obstructive Pulmonary Disease:  Controlled; current treatment:symbicort BID, duonebs 5 times daily;   0 exacerbations requiring treatment in the last 6 months   Recommended continue current regimen, and  Atrial Fibrillation:  Uncontrolled/controlled; current rate/rhythm control: diltiazem 30mg521m, coreg 18.721mg 24m anticoagulant treatment: apixaban 21mg BI44mRecommended continue current regimen  Patient Goals/Self-Care Activities Over the next 90 days, patient will:  take medications as prescribed  Follow Up Plan: Telephone follow up appointment with care management team member scheduled for:  3 months

## 2021-07-25 NOTE — Progress Notes (Signed)
Chronic Care Management Pharmacy Note  07/25/2021 Name:  Christopher Armstrong MRN:  [id number] DOB:  12-Dec-1940  Summary: addressed HTN, COPD, Afib. Patient recently transitioned from warfarin to apixaban, doing well. Currently filled RX from [facility] but is attempting to get connected through [place], in which apixaban would be even more affordable.  Recommendations/Changes made from today's visit: none  Plan: f/u with pharmacist in 3 months  Subjective: Christopher Armstrong is an 80 y.o. year old male who is a primary patient of [name], DO.  The CCM team was consulted for assistance with disease management and care coordination needs.    Engaged with patient by telephone for initial visit in response to provider referral for pharmacy case management and/or care coordination services.   Consent to Services:  The patient was given information about Chronic Care Management services, agreed to services, and gave verbal consent prior to initiation of services.  Please see initial visit note for detailed documentation.   Patient Care Team: [name], DO as PCP - General (Osteopathic Medicine) Darius Bump, [facility] as Pharmacist (Pharmacist)   Objective:  Lab Results  Component Value Date   CREATININE 0.90 05/09/2020   CREATININE 1.00 04/26/2020   CREATININE 1.14 07/14/2019    Hepatic Function Latest Ref Rng & Units 05/09/2020 04/26/2020 06/23/2019  Total Protein 6.1 - 8.1 g/dL 7.0 7.1 6.9  AST 10 - 35 U/L 18 23 16   ALT 9 - 46 U/L 13 18 18   Total Bilirubin 0.2 - 1.2 mg/dL 0.6 0.8 0.5    Lab Results  Component Value Date/Time   TSH 1.37 05/09/2020 11:13 AM   TSH 1.59 09/25/2018 11:54 AM    CBC Latest Ref Rng & Units 05/09/2020 04/26/2020 06/23/2019  WBC 3.8 - 10.8 Thousand/uL 9.7 8.5 14.0(H)  Hemoglobin 13.2 - 17.1 g/dL 13.8 13.8 14.5  Hematocrit 38.5 - 50.0 % 41.9 41.8 43.3  Platelets 140 - 400 Thousand/uL 147 152 137(L)    Social History   Tobacco Use  Smoking  Status Every Day   Packs/day: 0.50   Years: 65.00   Pack years: 32.50   Types: Cigarettes  Smokeless Tobacco Never   BP Readings from Last 3 Encounters:  05/30/21 (!) 147/68  03/31/21 (!) 148/72  12/01/20 (!) 200/71   Pulse Readings from Last 3 Encounters:  05/30/21 75  03/31/21 60  12/01/20 92   Wt Readings from Last 3 Encounters:  05/25/21 180 lb (81.6 kg)  03/31/21 189 lb (85.7 kg)  07/19/20 189 lb (85.7 kg)    Assessment: Review of patient past medical history, allergies, medications, health status, including review of consultants reports, laboratory and other test data, was performed as part of comprehensive evaluation and provision of chronic care management services.   SDOH:  (Social Determinants of Health) assessments and interventions performed:    CCM Care Plan  Allergies  Allergen Reactions   Clopidogrel Other (See Comments)    "Purple blood pockets clots in mouth" Blood blisters in roof of mouth, urinary retention    Ticagrelor Shortness Of Breath and Other (See Comments)   Atorvastatin Other (See Comments)   Simvastatin    Statins Nausea And Vomiting and Other (See Comments)   Finasteride Other (See Comments)   Terazosin     Other reaction(s): Orthostatic hypotension    Medications Reviewed Today     Reviewed by Darius Bump, [facility] (Pharmacist) on 07/25/21 at [place] List Status: <None>   Medication Order Taking? Sig Documenting Provider  Last Dose Status Informant  albuterol (VENTOLIN HFA) 108 (90 Base) MCG/ACT inhaler [id number] Yes Inhale 1-2 puffs into the lungs every 6 (six) hours as needed for wheezing or shortness of breath. [name], DO Taking Active   AMBULATORY NON [place] [id number] Yes Home INR machine, Dx atrial fibrillation and long term anticoagulation with coumadin [name], DO Taking Active   AMBULATORY NON [place] [id number] Yes Meals on Wheels service. Dx: COVID pneumonia, COPD, debility,  Afib [name], DO Taking Active   AMBULATORY NON [place] [id number] Yes Patient suffers from copd and arial fibrillation and debility d/t recent hospitalization which impairs their ability to perform daily activities like bathing, dressing, grooming, and toileting in the home.  A cane, crutch, or walker will not resolve issue with performing activities of daily living. A wheelchair will allow patient to safely perform daily activities. Patient can safely propel the wheelchair in the home or has a caregiver who can provide assistance. Length of need Lifetime. Accessories: elevating leg rests (ELRs), wheel locks, extensions and anti-tippers. [name], DO Taking Active   AMBULATORY NON [place] [id number] Yes Motorized wheelchair per patient preference and insurance coverage. Patient suffers from copd and arial fibrillation and debility d/t recent hospitalization which impairs their ability to perform daily activities like bathing, dressing, grooming, and toileting in the home.  A cane, crutch, or walker will not resolve issue with performing activities of daily living. A wheelchair will allow patient to safely perform daily activities. Patient can safely propel the wheelchair in the home or has a caregiver who can provide assistance. Length of need Lifetime. Accessories: elevating leg rests (ELRs), wheel locks, extensions and anti-tippers. Fax [phone] [name], DO Taking Active   apixaban (ELIQUIS) 5 MG TABS tablet [id number] Yes Take 1 tablet (5 mg total) by mouth 2 (two) times daily. [name], DO Taking Active   aspirin EC 81 MG tablet [id number] Yes Take 1 tablet (81 mg total) by mouth daily. Lelon Perla, MD Taking Active   budesonide-formoterol [facility]) 160-4.5 MCG/ACT inhaler [id number] Yes Inhale 2 puffs into the lungs 2 (two) times daily. [name], DO Taking Active   carvedilol (COREG) 12.5 MG tablet [id number] Yes Take 1.5  tablets (18.75 mg total) by mouth 2 (two) times daily with a meal.  Patient taking differently: Take 12.5 mg by mouth 2 (two) times daily with a meal.   [name], DO Taking Active   diltiazem (CARDIZEM) 30 MG tablet [id number] Yes TAKE 1 TABLET BY MOUTH AS NEEDED Lendon Colonel, NP Taking Active   furosemide (LASIX) 40 MG tablet [id number] Yes Take 0.5-1 tablets (20-40 mg total) by mouth daily. [name], DO Taking Active   guaiFENesin 200 MG tablet [id number] No Take 1-2 tablets (200-400 mg total) by mouth every 6 (six) hours as needed for cough or to loosen phlegm.  Patient not taking: No sig reported   [name], DO Not Taking Active   ipratropium-albuterol (DUONEB) 0.5-2.5 (3) MG/3ML SOLN [id number] Yes USE 1 VIAL IN NEBULIZER 5 TIMES DAILY [name], DO Taking Active   lisinopril (ZESTRIL) 40 MG tablet [id number] Yes Take 1 tablet (40 mg total) by mouth daily. [name], DO Taking Active   magnesium hydroxide (MILK OF MAGNESIA) 400 MG/5ML suspension [id number] Yes Take 30 mLs by mouth as needed. [provider] Taking Active   nitroGLYCERIN (NITROSTAT) 0.4 MG SL tablet [id number] Yes Place 1 tablet (0.4 mg total) under the tongue every 5 (five) minutes as  needed for chest pain. [name], DO Taking Active   OXYGEN [id number] Yes Inhale into the lungs daily. [provider] Taking Active            Med Note Carnella [place] Mar 02, 2020 10:30 AM)              Patient Active Problem List   Diagnosis Date Noted   Hypertrophy of prostate without urinary obstruction and other lower urinary tract symptoms (LUTS) 02/01/2021   Tobacco use disorder 02/01/2021   DNR no code (do not resuscitate) 05/17/2020   Dizziness 04/26/2020   Shortness of breath 11/05/2019   Hyperglycemia 11/04/2019   Community acquired pneumonia of right lower lobe of lung 11/04/2019   Bradycardia 11/04/2019   Bilateral lower extremity  edema 11/04/2019   CHF (congestive heart failure) ([place]) 06/23/2019   COPD (chronic obstructive pulmonary disease) with chronic bronchitis ([place]) 08/26/2018   Coronary artery disease of native artery of native heart with stable angina pectoris ([place]) 08/26/2018   Mixed hyperlipidemia 08/26/2018   Essential hypertension 08/26/2018   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  Conditions to be addressed/monitored: Atrial Fibrillation, HTN, and COPD  Care Plan : Medication Management  Updates made by Darius Bump, [place] since 07/25/2021 12:00 AM     Problem: HTN, COPD, Afib      Long-Range Goal: Disease Progression Prevention   Start Date: 07/25/2021  This Visit's Progress: On track  Priority: High  Note:   Current Barriers:  None at present  Pharmacist Clinical Goal(s):  Over the next 90 days, patient will maintain control of chronic conditions as evidenced by medication fill history, lab values, and vital signs  through collaboration with PharmD and provider.   Interventions: 1:1 collaboration with [name], DO regarding development and update of comprehensive plan of care as evidenced by provider attestation and co-signature Inter-disciplinary care team collaboration (see longitudinal plan of care) Comprehensive medication review performed; medication list updated in electronic medical record  Hypertension:  Controlled; current treatment:lisinopril 40mg  daily, diltiazem 30mg  PRN, coreg 12.5mg  BID; furosemide 40mg ,   Current home readings: SBP 145-155 per home health nurse, and SBP 120s in AM  Denies hypotensive/hypertensive  symptoms  Recommended continue current regimen,  Chronic Obstructive Pulmonary Disease:  Controlled; current treatment:symbicort BID, duonebs 5 times daily;   0 exacerbations requiring treatment in the last 6 months   Recommended continue current regimen, and  Atrial Fibrillation:  Uncontrolled/controlled; current rate/rhythm control: diltiazem 30mg  PRN, coreg 18.75mg  BID; anticoagulant treatment: apixaban 5mg  BID  Recommended continue current regimen  Patient Goals/Self-Care Activities Over the next 90 days, patient will:  take medications as prescribed  Follow Up Plan: Telephone follow up appointment with care management team member scheduled for:  3 months      Medication Assistance: None required.  Patient affirms current coverage meets needs.  Patient's preferred pharmacy is:  [facility], [place] [address] [place] [address] Phone: [phone] Fax: [place], [place]. [place] [address]  Phone: [phone] Fax: [place], [place] [address] Phone: [phone] Fax: [phone]  Uses pill box? Yes Pt endorses 100% compliance  Follow Up:  Patient agrees to Care Plan and Follow-up.  Plan: Telephone follow up appointment with care management team member scheduled for:  3 months  Larinda Buttery, PharmD Clinical Pharmacist [facility] [phone]

## 2021-07-29 DIAGNOSIS — R911 Solitary pulmonary nodule: Secondary | ICD-10-CM | POA: Diagnosis not present

## 2021-07-29 DIAGNOSIS — Z7901 Long term (current) use of anticoagulants: Secondary | ICD-10-CM | POA: Diagnosis not present

## 2021-07-29 DIAGNOSIS — F17210 Nicotine dependence, cigarettes, uncomplicated: Secondary | ICD-10-CM | POA: Diagnosis not present

## 2021-07-29 DIAGNOSIS — J449 Chronic obstructive pulmonary disease, unspecified: Secondary | ICD-10-CM | POA: Diagnosis not present

## 2021-07-29 DIAGNOSIS — Z7951 Long term (current) use of inhaled steroids: Secondary | ICD-10-CM | POA: Diagnosis not present

## 2021-07-29 DIAGNOSIS — Z955 Presence of coronary angioplasty implant and graft: Secondary | ICD-10-CM | POA: Diagnosis not present

## 2021-07-29 DIAGNOSIS — Z9981 Dependence on supplemental oxygen: Secondary | ICD-10-CM | POA: Diagnosis not present

## 2021-07-29 DIAGNOSIS — Z5181 Encounter for therapeutic drug level monitoring: Secondary | ICD-10-CM | POA: Diagnosis not present

## 2021-07-29 DIAGNOSIS — Z8701 Personal history of pneumonia (recurrent): Secondary | ICD-10-CM | POA: Diagnosis not present

## 2021-07-29 DIAGNOSIS — I25118 Atherosclerotic heart disease of native coronary artery with other forms of angina pectoris: Secondary | ICD-10-CM | POA: Diagnosis not present

## 2021-07-29 DIAGNOSIS — E782 Mixed hyperlipidemia: Secondary | ICD-10-CM | POA: Diagnosis not present

## 2021-07-29 DIAGNOSIS — Z7982 Long term (current) use of aspirin: Secondary | ICD-10-CM | POA: Diagnosis not present

## 2021-07-29 DIAGNOSIS — Z79899 Other long term (current) drug therapy: Secondary | ICD-10-CM | POA: Diagnosis not present

## 2021-07-31 DIAGNOSIS — J42 Unspecified chronic bronchitis: Secondary | ICD-10-CM | POA: Diagnosis not present

## 2021-08-08 DIAGNOSIS — E782 Mixed hyperlipidemia: Secondary | ICD-10-CM | POA: Diagnosis not present

## 2021-08-08 DIAGNOSIS — Z5181 Encounter for therapeutic drug level monitoring: Secondary | ICD-10-CM | POA: Diagnosis not present

## 2021-08-08 DIAGNOSIS — Z955 Presence of coronary angioplasty implant and graft: Secondary | ICD-10-CM | POA: Diagnosis not present

## 2021-08-08 DIAGNOSIS — Z9981 Dependence on supplemental oxygen: Secondary | ICD-10-CM | POA: Diagnosis not present

## 2021-08-08 DIAGNOSIS — Z7982 Long term (current) use of aspirin: Secondary | ICD-10-CM | POA: Diagnosis not present

## 2021-08-08 DIAGNOSIS — F17210 Nicotine dependence, cigarettes, uncomplicated: Secondary | ICD-10-CM | POA: Diagnosis not present

## 2021-08-08 DIAGNOSIS — Z7901 Long term (current) use of anticoagulants: Secondary | ICD-10-CM | POA: Diagnosis not present

## 2021-08-08 DIAGNOSIS — Z79899 Other long term (current) drug therapy: Secondary | ICD-10-CM | POA: Diagnosis not present

## 2021-08-08 DIAGNOSIS — I25118 Atherosclerotic heart disease of native coronary artery with other forms of angina pectoris: Secondary | ICD-10-CM | POA: Diagnosis not present

## 2021-08-08 DIAGNOSIS — Z7951 Long term (current) use of inhaled steroids: Secondary | ICD-10-CM | POA: Diagnosis not present

## 2021-08-08 DIAGNOSIS — J449 Chronic obstructive pulmonary disease, unspecified: Secondary | ICD-10-CM | POA: Diagnosis not present

## 2021-08-08 DIAGNOSIS — Z8701 Personal history of pneumonia (recurrent): Secondary | ICD-10-CM | POA: Diagnosis not present

## 2021-08-08 DIAGNOSIS — R911 Solitary pulmonary nodule: Secondary | ICD-10-CM | POA: Diagnosis not present

## 2021-08-09 DIAGNOSIS — I25118 Atherosclerotic heart disease of native coronary artery with other forms of angina pectoris: Secondary | ICD-10-CM | POA: Diagnosis not present

## 2021-08-09 DIAGNOSIS — F17210 Nicotine dependence, cigarettes, uncomplicated: Secondary | ICD-10-CM | POA: Diagnosis not present

## 2021-08-09 DIAGNOSIS — Z5181 Encounter for therapeutic drug level monitoring: Secondary | ICD-10-CM | POA: Diagnosis not present

## 2021-08-09 DIAGNOSIS — Z7901 Long term (current) use of anticoagulants: Secondary | ICD-10-CM | POA: Diagnosis not present

## 2021-08-09 DIAGNOSIS — Z9981 Dependence on supplemental oxygen: Secondary | ICD-10-CM | POA: Diagnosis not present

## 2021-08-09 DIAGNOSIS — Z955 Presence of coronary angioplasty implant and graft: Secondary | ICD-10-CM | POA: Diagnosis not present

## 2021-08-09 DIAGNOSIS — Z7982 Long term (current) use of aspirin: Secondary | ICD-10-CM | POA: Diagnosis not present

## 2021-08-09 DIAGNOSIS — Z79899 Other long term (current) drug therapy: Secondary | ICD-10-CM | POA: Diagnosis not present

## 2021-08-09 DIAGNOSIS — E782 Mixed hyperlipidemia: Secondary | ICD-10-CM | POA: Diagnosis not present

## 2021-08-09 DIAGNOSIS — Z8701 Personal history of pneumonia (recurrent): Secondary | ICD-10-CM | POA: Diagnosis not present

## 2021-08-09 DIAGNOSIS — Z7951 Long term (current) use of inhaled steroids: Secondary | ICD-10-CM | POA: Diagnosis not present

## 2021-08-09 DIAGNOSIS — R911 Solitary pulmonary nodule: Secondary | ICD-10-CM | POA: Diagnosis not present

## 2021-08-09 DIAGNOSIS — J449 Chronic obstructive pulmonary disease, unspecified: Secondary | ICD-10-CM | POA: Diagnosis not present

## 2021-08-14 DIAGNOSIS — I480 Paroxysmal atrial fibrillation: Secondary | ICD-10-CM | POA: Diagnosis not present

## 2021-08-14 DIAGNOSIS — I10 Essential (primary) hypertension: Secondary | ICD-10-CM

## 2021-08-14 DIAGNOSIS — J449 Chronic obstructive pulmonary disease, unspecified: Secondary | ICD-10-CM

## 2021-08-15 ENCOUNTER — Other Ambulatory Visit: Payer: Self-pay

## 2021-08-15 ENCOUNTER — Encounter: Payer: Self-pay | Admitting: Family Medicine

## 2021-08-15 ENCOUNTER — Ambulatory Visit (INDEPENDENT_AMBULATORY_CARE_PROVIDER_SITE_OTHER): Payer: Medicare (Managed Care) | Admitting: Family Medicine

## 2021-08-15 DIAGNOSIS — J449 Chronic obstructive pulmonary disease, unspecified: Secondary | ICD-10-CM

## 2021-08-15 MED ORDER — AMBULATORY NON FORMULARY MEDICATION: 99 refills | Status: DC

## 2021-08-15 NOTE — Patient Instructions (Signed)
Nice to see you today! Continue current medications.

## 2021-08-17 NOTE — Progress Notes (Addendum)
Christopher Armstrong - 80 y.o. male MRN [id number]  Date of birth: 10/14/1941  Subjective Chief Complaint  Patient presents with   DME for power wheelchair    HPI Christopher Armstrong is a 80 year old male here today for initial visit with me.  He is transferring care from Dr. Sheppard Coil.  He needs face-to-face visit for power wheelchair.  He has a history of COPD with chronic respiratory failure, CHF and generalized weakness.  A cane, crutch, walker are difficult for him to use due to mobility issues and use of oxygen.  He is unable to ambulate more than 200 feet without stopping.  He is unable to propel himself in a manual wheelchair.  He lives alone and there is no one to propel him in a wheelchair either.    ROS:  A comprehensive ROS was completed and negative except as noted per HPI  Allergies  Allergen Reactions   Clopidogrel Other (See Comments)    "Purple blood pockets clots in mouth" Blood blisters in roof of mouth, urinary retention    Ticagrelor Shortness Of Breath and Other (See Comments)   Atorvastatin Other (See Comments)   Simvastatin    Statins Nausea And Vomiting and Other (See Comments)   Finasteride Other (See Comments)   Terazosin     Other reaction(s): Orthostatic hypotension    Past Medical History:  Diagnosis Date   BPH (benign prostatic hyperplasia)    CHF (congestive heart failure) ([place]) 06/23/2019   COPD (chronic obstructive pulmonary disease) with chronic bronchitis ([place]) 08/26/2018   Coronary artery disease of native artery of native heart with stable angina pectoris ([place]) 08/26/2018   Essential hypertension 08/26/2018   History of coronary artery stent placement 08/26/2018   Mixed hyperlipidemia 08/26/2018   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   Tobacco use disorder, severe, dependence 08/26/2018    Past Surgical History:  Procedure Laterality Date   BLADDER TUMOR EXCISION     CORONARY ANGIOPLASTY WITH STENT PLACEMENT     PROSTATE SURGERY     TONSILLECTOMY      Social  History   Socioeconomic History   Marital status: Single    Spouse name: Not on file   Number of children: 1   Years of education: 12   Highest education level: 12th grade  Occupational History   Occupation: Architect    Comment: retired  Tobacco Use   Smoking status: Every Day    Packs/day: 0.50    Years: 65.00    Pack years: 32.50    Types: Cigarettes   Smokeless tobacco: Never  Vaping Use   Vaping Use: Never used  Substance and Sexual Activity   Alcohol use: Not Currently    Alcohol/week: 0.0 standard drinks    Comment: 0   Drug use: Never   Sexual activity: Not Currently    Partners: Female  Other Topics Concern   Not on file  Social History Narrative   Watch TV   Social Determinants of Health   Financial Resource Strain: Low Risk    Difficulty of Paying Living Expenses: Not hard at all  Food Insecurity: No Food Insecurity   Worried About Charity fundraiser in the Last Year: Never true   Arboriculturist in the Last Year: Never true  Transportation Needs: No Transportation Needs   Lack of Transportation (Medical): No   Lack of Transportation (Non-Medical): No  Physical Activity: Inactive   Days of Exercise per Week: 0 days   Minutes of Exercise per Session: 0  min  Stress: No Stress Concern Present   Feeling of Stress : Not at all  Social Connections: Socially Isolated   Frequency of Communication with Friends and Family: Twice a week   Frequency of Social Gatherings with Friends and Family: Never   Attends Religious Services: Never   Printmaker: No   Attends Music therapist: Never   Marital Status: Divorced    Family History  Problem Relation Age of Onset   High blood pressure Mother    Heart failure Mother    Lung cancer Father    High blood pressure Sister    Fibromyalgia Sister     Health Maintenance  Topic Date Due   COVID-19 Vaccine (1) Never done   Pneumonia Vaccine 5+ Years old (1 - PCV)  Never done   Zoster Vaccines- Shingrix (1 of 2) Never done   Hepatitis C Screening  08/31/2021 (Originally 08/19/1959)   TETANUS/TDAP  10/17/2021 (Originally 08[birth year]19/1962)   HPV VACCINES  Aged Out   INFLUENZA VACCINE  Discontinued     ----------------------------------------------------------------------------------------------------------------------------------------------------------------------------------------------------------------- Physical Exam BP (!) 187/66   Pulse 80   Wt 177 lb (80.3 kg)   SpO2 97% Comment: on 2.5L O2  BMI 26.91 kg/m   Physical Exam Constitutional:      Appearance: Normal appearance.  HENT:     Head: Normocephalic and atraumatic.  Eyes:     General: No scleral icterus. Cardiovascular:     Rate and Rhythm: Normal rate and regular rhythm.  Pulmonary:     Comments: Coarse breath sounds but good air movement bilaterally. Neurological:     Mental Status: He is alert.     Comments: B/L upper extremity strength is 3.5/5 B/L lower extremity strength is 3/5  Psychiatric:        Mood and Affect: Mood normal.        Behavior: Behavior normal.    ------------------------------------------------------------------------------------------------------------------------------------------------------------------------------------------------------------------- Assessment and Plan  COPD (chronic obstructive pulmonary disease) with chronic bronchitis (HCC) He has COPD with chronic respiratory failure and is on chronic O2.  He desaturates into the 80's when off of O2 and with exertion.   He is unable to complete ADLs with use of cane, crutch or walker.  Unable to propel himself with manual wheelchair.  Scooter would not be a good option due to restrictions within his home and difficulty with tiller type steering. I recommend use of power chair and he has the mental capacity and operate this safely and is willing to use this at home.  Prescription updated.   Meds  ordered this encounter  Medications   AMBULATORY NON FORMULARY MEDICATION    Sig: Motorized wheelchair per patient preference and insurance coverage. Patient suffers from copd and atrial fibrillation and debility d/t recent hospitalization which impairs their ability to perform daily activities like bathing, dressing, grooming, and toileting in the home.  A cane, crutch,walker or manual wheelchair will not resolve issue with performing activities of daily living. A  power wheelchair will allow patient to safely perform daily activities. Patient unable to safely propel the wheelchair and does not have a caregiver who can provide assistance. Length of need Lifetime.  Fax [phone]    Dispense:  1 Units    Refill:  99    No follow-ups on file.    This visit occurred during the SARS-CoV-2 public health emergency.  Safety protocols were in place, including screening questions prior to the visit, additional usage of staff PPE, and extensive  cleaning of exam room while observing appropriate contact time as indicated for disinfecting solutions.

## 2021-08-17 NOTE — Assessment & Plan Note (Addendum)
He has COPD with chronic respiratory failure and is on chronic O2.  He desaturates into the 80's when off of O2 and with exertion.   He is unable to complete ADLs with use of cane, crutch or walker.  Unable to propel himself with manual wheelchair.  Scooter would not be a good option due to restrictions within his home and difficulty with tiller type steering. I recommend use of power chair and he has the mental capacity and operate this safely and is willing to use this at home.  Prescription updated.

## 2021-08-22 ENCOUNTER — Telehealth: Payer: Self-pay

## 2021-08-22 ENCOUNTER — Other Ambulatory Visit: Payer: Self-pay | Admitting: Family Medicine

## 2021-08-22 MED ORDER — AMBULATORY NON FORMULARY MEDICATION: 99 refills | Status: DC

## 2021-08-22 NOTE — Telephone Encounter (Signed)
Christopher Armstrong called stating [place] had not received his wheelchair documentation.   Re-faxed the information to [place] @[phone]  Pt has been advised.

## 2021-08-24 DIAGNOSIS — J42 Unspecified chronic bronchitis: Secondary | ICD-10-CM | POA: Diagnosis not present

## 2021-09-13 DIAGNOSIS — F17210 Nicotine dependence, cigarettes, uncomplicated: Secondary | ICD-10-CM | POA: Diagnosis not present

## 2021-09-13 DIAGNOSIS — J449 Chronic obstructive pulmonary disease, unspecified: Secondary | ICD-10-CM | POA: Diagnosis not present

## 2021-09-13 DIAGNOSIS — K068 Other specified disorders of gingiva and edentulous alveolar ridge: Secondary | ICD-10-CM | POA: Diagnosis not present

## 2021-09-13 DIAGNOSIS — Z9981 Dependence on supplemental oxygen: Secondary | ICD-10-CM | POA: Diagnosis not present

## 2021-09-13 DIAGNOSIS — Z79899 Other long term (current) drug therapy: Secondary | ICD-10-CM | POA: Diagnosis not present

## 2021-09-13 DIAGNOSIS — Z7901 Long term (current) use of anticoagulants: Secondary | ICD-10-CM | POA: Diagnosis not present

## 2021-09-13 DIAGNOSIS — I10 Essential (primary) hypertension: Secondary | ICD-10-CM | POA: Diagnosis not present

## 2021-09-13 DIAGNOSIS — Z7951 Long term (current) use of inhaled steroids: Secondary | ICD-10-CM | POA: Diagnosis not present

## 2021-09-13 DIAGNOSIS — I252 Old myocardial infarction: Secondary | ICD-10-CM | POA: Diagnosis not present

## 2021-09-13 DIAGNOSIS — Z888 Allergy status to other drugs, medicaments and biological substances status: Secondary | ICD-10-CM | POA: Diagnosis not present

## 2021-09-13 DIAGNOSIS — Z955 Presence of coronary angioplasty implant and graft: Secondary | ICD-10-CM | POA: Diagnosis not present

## 2021-09-13 DIAGNOSIS — K029 Dental caries, unspecified: Secondary | ICD-10-CM | POA: Diagnosis not present

## 2021-09-13 DIAGNOSIS — Z7982 Long term (current) use of aspirin: Secondary | ICD-10-CM | POA: Diagnosis not present

## 2021-09-13 DIAGNOSIS — I480 Paroxysmal atrial fibrillation: Secondary | ICD-10-CM | POA: Diagnosis not present

## 2021-09-15 ENCOUNTER — Telehealth: Payer: Self-pay

## 2021-09-15 NOTE — Telephone Encounter (Signed)
Transition Care Management Unsuccessful Follow-up Telephone Call  Date of discharge and from where:  09/13/2021 from [facility]  Attempts:  1st Attempt  Reason for unsuccessful TCM follow-up call:  Left voice message

## 2021-09-18 NOTE — Telephone Encounter (Signed)
Transition Care Management Follow-up Telephone Call Date of discharge and from where: 09/13/2021 from [place] How have you been since you were released from the hospital? Pt stated that he is feeling well. Pt stated that he is waiting on information about whether or not he can cut the Eliquis 5mg  in half.  Any questions or concerns? No  Items Reviewed: Did the pt receive and understand the discharge instructions provided? Yes  Medications obtained and verified? Yes  Other? No  Any new allergies since your discharge? No  Dietary orders reviewed? No Do you have support at home? Yes   Functional Questionnaire: (I = Independent and D = Dependent) ADLs: I  Bathing/Dressing- I  Meal Prep- I  Eating- I  Maintaining continence- I  Transferring/Ambulation- I  Managing Meds- I   Follow up appointments reviewed:  PCP Hospital f/u appt confirmed? No   Specialist Hospital f/u appt confirmed? No   Are transportation arrangements needed? No  If their condition worsens, is the pt aware to call PCP or go to the Emergency Dept.? Yes Was the patient provided with contact information for the PCP's office or ED? Yes Was to pt encouraged to call back with questions or concerns? Yes

## 2021-10-03 DIAGNOSIS — J42 Unspecified chronic bronchitis: Secondary | ICD-10-CM | POA: Diagnosis not present

## 2021-10-27 DIAGNOSIS — J42 Unspecified chronic bronchitis: Secondary | ICD-10-CM | POA: Diagnosis not present

## 2021-11-02 ENCOUNTER — Other Ambulatory Visit: Payer: Self-pay

## 2021-11-02 ENCOUNTER — Ambulatory Visit (INDEPENDENT_AMBULATORY_CARE_PROVIDER_SITE_OTHER): Payer: Medicare (Managed Care) | Admitting: Pharmacist

## 2021-11-02 DIAGNOSIS — I10 Essential (primary) hypertension: Secondary | ICD-10-CM

## 2021-11-02 DIAGNOSIS — J449 Chronic obstructive pulmonary disease, unspecified: Secondary | ICD-10-CM

## 2021-11-02 DIAGNOSIS — I480 Paroxysmal atrial fibrillation: Secondary | ICD-10-CM

## 2021-11-02 NOTE — Progress Notes (Signed)
Chronic Care Management Pharmacy Note  11/06/2021 Name:  Christopher Armstrong MRN:  [id number] DOB:  [birth year]  Summary: addressed HTN, COPD, Afib. Patient transitioned from warfarin to apixaban, and fills through [place] for improved affordability. Had a bleeding episode in his mouth 08/2021, otherwise no issues.  He mentions the cardiologist at [facility] clinic is adding a blood pressure medication but was unsure what medicine, SBP 220s at his [place] appointment, but normally 024O systolic at home and attributes his elevated reading to white coat HTN.  Home readings: 153/54 HR 88, 150/57 HR 83, 147/60 HR 79   Recommendations/Changes made from today's visit: none  Plan: f/u with pharmacist in 6 months  Subjective: Christopher Armstrong is an 81 y.o. year old male who is a primary patient of [name], DO.  The CCM team was consulted for assistance with disease management and care coordination needs.    Engaged with patient by telephone for follow up visit in response to provider referral for pharmacy case management and/or care coordination services.   Consent to Services:  The patient was given information about Chronic Care Management services, agreed to services, and gave verbal consent prior to initiation of services.  Please see initial visit note for detailed documentation.   Patient Care Team: [name], DO as PCP - General (Osteopathic Medicine) Darius Bump, [place] as Pharmacist (Pharmacist)   Objective:  Lab Results  Component Value Date   CREATININE 0.90 05/09/2020   CREATININE 1.00 04/26/2020   CREATININE 1.14 07/14/2019    Hepatic Function Latest Ref Rng & Units 05/09/2020 04/26/2020 06/23/2019  Total Protein 6.1 - 8.1 g/dL 7.0 7.1 6.9  AST 10 - 35 U/L 18 23 16   ALT 9 - 46 U/L 13 18 18   Total Bilirubin 0.2 - 1.2 mg/dL 0.6 0.8 0.5    Lab Results  Component Value Date/Time   TSH 1.37 05/09/2020 11:13 AM   TSH 1.59 09/25/2018 11:54 AM    CBC Latest Ref Rng & Units  05/09/2020 04/26/2020 06/23/2019  WBC 3.8 - 10.8 Thousand/uL 9.7 8.5 14.0(H)  Hemoglobin 13.2 - 17.1 g/dL 13.8 13.8 14.5  Hematocrit 38.5 - 50.0 % 41.9 41.8 43.3  Platelets 140 - 400 Thousand/uL 147 152 137(L)    Social History   Tobacco Use  Smoking Status Every Day   Packs/day: 0.50   Years: 65.00   Pack years: 32.50   Types: Cigarettes  Smokeless Tobacco Never   BP Readings from Last 3 Encounters:  08/15/21 (!) 187/66  05/30/21 (!) 147/68  03/31/21 (!) 148/72   Pulse Readings from Last 3 Encounters:  08/15/21 80  05/30/21 75  03/31/21 60   Wt Readings from Last 3 Encounters:  08/15/21 177 lb (80.3 kg)  05/25/21 180 lb (81.6 kg)  03/31/21 189 lb (85.7 kg)    Assessment: Review of patient past medical history, allergies, medications, health status, including review of consultants reports, laboratory and other test data, was performed as part of comprehensive evaluation and provision of chronic care management services.   SDOH:  (Social Determinants of Health) assessments and interventions performed:    CCM Care Plan  Allergies  Allergen Reactions   Clopidogrel Other (See Comments)    "Purple blood pockets clots in mouth" Blood blisters in roof of mouth, urinary retention    Ticagrelor Shortness Of Breath and Other (See Comments)   Atorvastatin Other (See Comments)   Simvastatin    Statins Nausea And Vomiting and Other (See Comments)   Finasteride  Other (See Comments)   Terazosin     Other reaction(s): Orthostatic hypotension    Medications Reviewed Today     Reviewed by Fonnie Mu, CMA (Certified Medical Assistant) on 08/15/21 at [place] List Status: <None>   Medication Order Taking? Sig Documenting Provider Last Dose Status Informant  albuterol (VENTOLIN HFA) 108 (90 Base) MCG/ACT inhaler [id number] Yes Inhale 1-2 puffs into the lungs every 6 (six) hours as needed for wheezing or shortness of breath. [name], DO Taking Active   AMBULATORY  NON [place] [id number] Yes Home INR machine, Dx atrial fibrillation and long term anticoagulation with coumadin [name], DO Taking Active   AMBULATORY NON [place] [id number] Yes Meals on Wheels service. Dx: COVID pneumonia, COPD, debility, Afib [name], DO Taking Active   AMBULATORY NON [place] [id number] Yes Patient suffers from copd and arial fibrillation and debility d/t recent hospitalization which impairs their ability to perform daily activities like bathing, dressing, grooming, and toileting in the home.  A cane, crutch, or walker will not resolve issue with performing activities of daily living. A wheelchair will allow patient to safely perform daily activities. Patient can safely propel the wheelchair in the home or has a caregiver who can provide assistance. Length of need Lifetime. Accessories: elevating leg rests (ELRs), wheel locks, extensions and anti-tippers. [name], DO Taking Active   AMBULATORY NON [place] [id number] Yes Motorized wheelchair per patient preference and insurance coverage. Patient suffers from copd and arial fibrillation and debility d/t recent hospitalization which impairs their ability to perform daily activities like bathing, dressing, grooming, and toileting in the home.  A cane, crutch, or walker will not resolve issue with performing activities of daily living. A wheelchair will allow patient to safely perform daily activities. Patient can safely propel the wheelchair in the home or has a caregiver who can provide assistance. Length of need Lifetime. Accessories: elevating leg rests (ELRs), wheel locks, extensions and anti-tippers. Fax [phone] [name], DO Taking Active   apixaban (ELIQUIS) 5 MG TABS tablet [id number] Yes Take 1 tablet (5 mg total) by mouth 2 (two) times daily. [name], DO Taking Active   aspirin EC 81 MG tablet [id number] Yes Take 1 tablet (81 mg total) by  mouth daily. Lelon Perla, MD Taking Active   budesonide-formoterol [facility]) 160-4.5 MCG/ACT inhaler [id number] Yes Inhale 2 puffs into the lungs 2 (two) times daily. [name], DO Taking Active   carvedilol (COREG) 12.5 MG tablet [id number] Yes Take 1.5 tablets (18.75 mg total) by mouth 2 (two) times daily with a meal.  Patient taking differently: Take 12.5 mg by mouth 2 (two) times daily with a meal.   [name], DO Taking Active   diltiazem (CARDIZEM) 30 MG tablet [id number] Yes TAKE 1 TABLET BY MOUTH AS NEEDED Lendon Colonel, NP Taking Active   furosemide (LASIX) 40 MG tablet [id number] Yes Take 0.5-1 tablets (20-40 mg total) by mouth daily. [name], DO Taking Active   guaiFENesin 200 MG tablet [id number] Yes Take 1-2 tablets (200-400 mg total) by mouth every 6 (six) hours as needed for cough or to loosen phlegm. [name], DO Taking Active   ipratropium-albuterol (DUONEB) 0.5-2.5 (3) MG/3ML [facility] Mech [id number] Yes USE 1 VIAL IN NEBULIZER 5 TIMES DAILY [name], DO Taking Active   lisinopril (ZESTRIL) 40 MG tablet [id number] Yes Take 1 tablet (40 mg total) by mouth daily. [name], DO Taking Active   magnesium hydroxide (MILK OF MAGNESIA) 400  MG/5ML suspension [id number] Yes Take 30 mLs by mouth as needed. [provider] Taking Active   nitroGLYCERIN (NITROSTAT) 0.4 MG SL tablet [id number] Yes Place 1 tablet (0.4 mg total) under the tongue every 5 (five) minutes as needed for chest pain. [name], DO Taking Active   OXYGEN [id number] Yes Inhale into the lungs daily. [provider] Taking Active            Med Note Carnella [place] Mar 02, 2020 10:30 AM)              Patient Active Problem List   Diagnosis Date Noted   Hypertrophy of prostate without urinary obstruction and other lower urinary tract symptoms (LUTS) 02/01/2021   Tobacco use disorder 02/01/2021   DNR no code (do not  resuscitate) 05/17/2020   Dizziness 04/26/2020   Shortness of breath 11/05/2019   Hyperglycemia 11/04/2019   Community acquired pneumonia of right lower lobe of lung 11/04/2019   Bradycardia 11/04/2019   Bilateral lower extremity edema 11/04/2019   CHF (congestive heart failure) ([place]) 06/23/2019   COPD (chronic obstructive pulmonary disease) with chronic bronchitis ([place]) 08/26/2018   Coronary artery disease of native artery of native heart with stable angina pectoris ([place]) 08/26/2018   Mixed hyperlipidemia 08/26/2018   Essential hypertension 08/26/2018   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  Conditions to be addressed/monitored: Atrial Fibrillation, HTN, and COPD  Care Plan : Medication Management  Updates made by Darius Bump, [place] since 11/06/2021 12:00 AM     Problem: HTN, COPD, Afib      Long-Range Goal: Disease Progression Prevention   Start Date: 07/25/2021  Recent Progress: On track  Priority: High  Note:   Current Barriers:  None at present  Pharmacist Clinical Goal(s):  Over the next 180 days, patient will maintain control of chronic conditions as evidenced by medication fill history, lab values, and vital signs  through collaboration with PharmD and provider.   Interventions: 1:1 collaboration with [name], DO regarding development and update of comprehensive plan of care as evidenced by provider attestation and co-signature Inter-disciplinary care team collaboration (see longitudinal plan of care) Comprehensive medication review performed; medication list updated in electronic medical  record  Hypertension:  Controlled; current treatment:lisinopril 40mg  daily, diltiazem 30mg  PRN, coreg 12.5mg  BID; furosemide 40mg ,   Current home readings: SBP 145-155 per home health nurse, and SBP 120s in AM  Denies hypotensive/hypertensive symptoms  Recommended continue current regimen,  Chronic Obstructive Pulmonary Disease:  Controlled; current treatment:symbicort BID, duonebs 5 times daily;   0 exacerbations requiring treatment in the last 6 months   Recommended continue current regimen, and  Atrial Fibrillation:  Controlled; current rate/rhythm control: diltiazem 30mg  PRN, coreg 12.5mg  BID; anticoagulant treatment: apixaban 5mg  BID  Recommended continue current regimen  Patient Goals/Self-Care Activities Over the next 180 days, patient will:  take medications as prescribed  Follow Up Plan: Telephone follow up appointment with care management team member scheduled for:  6 months       Medication Assistance: None required.  Patient affirms current coverage meets needs.  Patient's preferred pharmacy is:  [place]  [place] - [address] [place] [address] Phone: [phone] Fax: [place], [place] [place] [place] [address] Phone: [phone] Fax: [place], [place] - [address] [place] [address] Phone: [phone] Fax: [phone]   Uses pill box? Yes Pt endorses 100% compliance  Follow Up:  Patient agrees to Care Plan and Follow-up.  Plan: Telephone follow up appointment with care management team member scheduled for:  6 months  Larinda Buttery, PharmD Clinical Pharmacist [facility] [phone]

## 2021-11-06 NOTE — Patient Instructions (Signed)
Visit Information  Thank you for taking time to visit with me today. Please don't hesitate to contact me if I can be of assistance to you before our next scheduled telephone appointment.  Following are the goals we discussed today:   Patient Goals/Self-Care Activities Over the next 180 days, patient will:  take medications as prescribed  Follow Up Plan: Telephone follow up appointment with care management team member scheduled for:  6 months   Please call the care guide team at [phone] if you need to cancel or reschedule your appointment.    The patient verbalized understanding of instructions, educational materials, and care plan provided today and agreed to receive a mailed copy of patient instructions, educational materials, and care plan.   Beadie Matsunaga J [place]  

## 2021-11-13 ENCOUNTER — Ambulatory Visit (INDEPENDENT_AMBULATORY_CARE_PROVIDER_SITE_OTHER): Payer: Medicare (Managed Care) | Admitting: Family Medicine

## 2021-11-13 VITALS — BP 148/57 | HR 62 | Ht 69.0 in | Wt 172.5 lb

## 2021-11-13 DIAGNOSIS — Z0000 Encounter for general adult medical examination without abnormal findings: Secondary | ICD-10-CM

## 2021-11-13 NOTE — Patient Instructions (Signed)
[place] Maintenance Summary and Written Plan of Care  Mr. Christopher Armstrong ,  Thank you for allowing me to perform your Medicare Annual Wellness Visit and for your ongoing commitment to your health.   Health Maintenance & Immunization History Health Maintenance  Topic Date Due   COVID-19 Vaccine (1) 11/29/2021 (Originally 02/17/1942)   Zoster Vaccines- Shingrix (1 of 2) 02/11/2022 (Originally 08/19/1960)   Pneumonia Vaccine 88+ Years old (1 - PCV) 11/13/2022 (Originally 08/19/1947)   TETANUS/TDAP  11/13/2022 (Originally 08/19/1960)   Hepatitis C Screening  11/13/2022 (Originally 08/19/1959)   HPV VACCINES  Aged Out   INFLUENZA VACCINE  Discontinued    There is no immunization history on file for this patient.  These are the patient goals that we discussed:  Goals Addressed               This Visit's Progress     Patient Stated (pt-stated)        11/13/2021 AWV Goal: Improved Nutrition/Diet  Patient will verbalize understanding that diet plays an important role in overall health and that a poor diet is a risk factor for many chronic medical conditions.  Over the next year, patient will improve self management of their diet by incorporating more water. Patient will utilize available community resources to help with food acquisition if needed (ex: food pantries, [address], etc) Patient will work with nutrition specialist if a referral was made          This is a list of Health Maintenance Items that are overdue or due now: Pneumococcal vaccine  Influenza vaccine Td vaccine Shingrix vaccine  Patient declined all of the vaccines at this time.   Orders/Referrals Placed Today: No orders of the defined types were placed in this encounter.  (Contact our referral department at [phone] if you have not spoken with someone about your referral appointment within the next 5 days)    Follow-up Plan Follow-up with [name], DO as planned Medicare  wellness visit in one year. AVS printed and mailed to the patient.     Health Maintenance, Male Adopting a healthy lifestyle and getting preventive care are important in promoting health and wellness. Ask your health care provider about: The right schedule for you to have regular tests and exams. Things you can do on your own to prevent diseases and keep yourself healthy. What should I know about diet, weight, and exercise? Eat a healthy diet  Eat a diet that includes plenty of vegetables, fruits, low-fat dairy products, and lean protein. Do not eat a lot of foods that are high in solid fats, added sugars, or sodium. Maintain a healthy weight Body mass index (BMI) is a measurement that can be used to identify possible weight problems. It estimates body fat based on height and weight. Your health care provider can help determine your BMI and help you achieve or maintain a healthy weight. Get regular exercise Get regular exercise. This is one of the most important things you can do for your health. Most adults should: Exercise for at least 150 minutes each week. The exercise should increase your heart rate and make you sweat (moderate-intensity exercise). Do strengthening exercises at least twice a week. This is in addition to the moderate-intensity exercise. Spend less time sitting. Even light physical activity can be beneficial. Watch cholesterol and blood lipids Have your blood tested for lipids and cholesterol at 79 years of age, then have this test every 5 years. You may need to  have your cholesterol levels checked more often if: Your lipid or cholesterol levels are high. You are older than 79 years of age. You are at high risk for heart disease. What should I know about cancer screening? Many types of cancers can be detected early and may often be prevented. Depending on your health history and family history, you may need to have cancer screening at various ages. This may include  screening for: Colorectal cancer. Prostate cancer. Skin cancer. Lung cancer. What should I know about heart disease, diabetes, and high blood pressure? Blood pressure and heart disease High blood pressure causes heart disease and increases the risk of stroke. This is more likely to develop in people who have high blood pressure readings or are overweight. Talk with your health care provider about your target blood pressure readings. Have your blood pressure checked: Every 3-5 years if you are 65-57 years of age. Every year if you are 37 years old or older. If you are between the ages of 55 and 73 and are a current or former smoker, ask your health care provider if you should have a one-time screening for abdominal aortic aneurysm (AAA). Diabetes Have regular diabetes screenings. This checks your fasting blood sugar level. Have the screening done: Once every three years after age 50 if you are at a normal weight and have a low risk for diabetes. More often and at a younger age if you are overweight or have a high risk for diabetes. What should I know about preventing infection? Hepatitis B If you have a higher risk for hepatitis B, you should be screened for this virus. Talk with your health care provider to find out if you are at risk for hepatitis B infection. Hepatitis C Blood testing is recommended for: Everyone born from [birth year] through 1963. Anyone with known risk factors for hepatitis C. Sexually transmitted infections (STIs) You should be screened each year for STIs, including gonorrhea and chlamydia, if: You are sexually active and are younger than 79 years of age. You are older than 79 years of age and your health care provider tells you that you are at risk for this type of infection. Your sexual activity has changed since you were last screened, and you are at increased risk for chlamydia or gonorrhea. Ask your health care provider if you are at risk. Ask your health care  provider about whether you are at high risk for HIV. Your health care provider may recommend a prescription medicine to help prevent HIV infection. If you choose to take medicine to prevent HIV, you should first get tested for HIV. You should then be tested every 3 months for as long as you are taking the medicine. Follow these instructions at home: Alcohol use Do not drink alcohol if your health care provider tells you not to drink. If you drink alcohol: Limit how much you have to 0-2 drinks a day. Know how much alcohol is in your drink. In the [place] one drink equals one 12 oz bottle of beer (355 mL), one 5 oz glass of wine (148 mL), or one 1 oz glass of hard liquor (44 mL). Lifestyle Do not use any products that contain nicotine or tobacco. These products include cigarettes, chewing tobacco, and vaping devices, such as e-cigarettes. If you need help quitting, ask your health care provider. Do not use street drugs. Do not share needles. Ask your health care provider for help if you need support or information about quitting drugs. General instructions  Schedule regular health, dental, and eye exams. Stay current with your vaccines. Tell your health care provider if: You often feel depressed. You have ever been abused or do not feel safe at home. Summary Adopting a healthy lifestyle and getting preventive care are important in promoting health and wellness. Follow your health care provider's instructions about healthy diet, exercising, and getting tested or screened for diseases. Follow your health care provider's instructions on monitoring your cholesterol and blood pressure. This information is not intended to replace advice given to you by your health care provider. Make sure you discuss any questions you have with your health care provider. Document Revised: 02/20/2021 Document Reviewed: 02/20/2021 Elsevier Patient Education  [place].

## 2021-11-13 NOTE — Progress Notes (Signed)
MEDICARE ANNUAL WELLNESS VISIT  11/13/2021  Telephone Visit Disclaimer This Medicare AWV was conducted by telephone due to national recommendations for restrictions regarding the COVID-19 Pandemic (e.g. social distancing).  I verified, using two identifiers, that I am speaking with Christopher Armstrong or their authorized healthcare agent. I discussed the limitations, risks, security, and privacy concerns of performing an evaluation and management service by telephone and the potential availability of an in-person appointment in the future. The patient expressed understanding and agreed to proceed.  Location of Patient: Home Location of Provider (nurse):  In the office.  Subjective:    Christopher Armstrong is a 81 y.o. male patient of [name], DO who had a Medicare Annual Wellness Visit today via telephone. Christopher Armstrong is Retired and lives alone. he has 1 child. he reports that he is socially active and does interact with friends/family regularly. he is minimally physically active and enjoys watching television.  Patient Care Team: [name], DO as PCP - General (Family Medicine) Darius Bump, [facility] as Pharmacist (Pharmacist)  Advanced Directives 11/13/2021 10/17/2020 05/25/2019  Does Patient Have a Medical Advance Directive? Yes Yes No  Type of Advance Directive Living will;Healthcare Power of [place] -  Does patient want to make changes to medical advance directive? No - Patient declined No - Patient declined -  Copy of [place] in Chart? No - copy requested No - copy requested -  Would patient like information on creating a medical advance directive? - No - Patient declined No - Patient declined    Hospital Utilization Over the Past 12 Months: # of hospitalizations or ER visits: 1 # of surgeries: 0  Review of Systems    Patient reports that his overall health is worse compared to last year.  History obtained from chart review and the  patient  Patient Reported Readings (BP, Pulse, CBG, Weight, etc) Weight 172.5 lb Height 95f9 BMI 148/57 Pulse 62  Pain Assessment Pain : No/denies pain     Current Medications & Allergies (verified) Allergies as of 11/13/2021       Reactions   Clopidogrel Other (See Comments)   "Purple blood pockets clots in mouth" Blood blisters in roof of mouth, urinary retention   Ticagrelor Shortness Of Breath, Other (See Comments)   Atorvastatin Other (See Comments)   Simvastatin    Statins Nausea And Vomiting, Other (See Comments)   Finasteride Other (See Comments)   Terazosin    Other reaction(s): Orthostatic hypotension        Medication List        Accurate as of November 13, 2021  8:40 AM. If you have any questions, ask your nurse or doctor.          albuterol 108 (90 Base) MCG/ACT inhaler Commonly known as: VENTOLIN HFA Inhale 1-2 puffs into the lungs every 6 (six) hours as needed for wheezing or shortness of breath.   AMBULATORY NON FORMULARY MEDICATION Home INR machine, Dx atrial fibrillation and long term anticoagulation with coumadin   AMBULATORY NON FORMULARY MEDICATION Motorized wheelchair per patient preference and insurance coverage. Patient suffers from copd and atrial fibrillation and debility d/t recent hospitalization which impairs their ability to perform daily activities like bathing, dressing, grooming, and toileting in the home.  A cane, crutch,walker or manual wheelchair will not resolve issue with performing activities of daily living. A  power wheelchair will allow patient to safely perform daily activities. Patient unable to safely propel  the wheelchair and does not have a caregiver who can provide assistance. Length of need Lifetime.  Fax [phone]   amLODipine 5 MG tablet Commonly known as: NORVASC Take 1 tablet by mouth daily.   apixaban 5 MG Tabs tablet Commonly known as: ELIQUIS Take 1 tablet (5 mg total) by mouth 2 (two) times daily.    aspirin EC 81 MG tablet Take 1 tablet (81 mg total) by mouth daily.   budesonide-formoterol 160-4.5 MCG/ACT inhaler Commonly known as: SYMBICORT Inhale 2 puffs into the lungs 2 (two) times daily.   carvedilol 12.5 MG tablet Commonly known as: COREG Take 1.5 tablets (18.75 mg total) by mouth 2 (two) times daily with a meal. What changed: how much to take   diltiazem 30 MG tablet Commonly known as: CARDIZEM TAKE 1 TABLET BY MOUTH AS NEEDED   furosemide 40 MG tablet Commonly known as: LASIX Take 0.5-1 tablets (20-40 mg total) by mouth daily.   guaiFENesin 200 MG tablet Take 1-2 tablets (200-400 mg total) by mouth every 6 (six) hours as needed for cough or to loosen phlegm.   ipratropium-albuterol 0.5-2.5 (3) MG/3ML Soln Commonly known as: DUONEB USE 1 VIAL IN NEBULIZER 5 TIMES DAILY   lisinopril 40 MG tablet Commonly known as: ZESTRIL Take 1 tablet (40 mg total) by mouth daily.   magnesium hydroxide 400 MG/5ML suspension Commonly known as: MILK OF MAGNESIA Take 30 mLs by mouth as needed.   nitroGLYCERIN 0.4 MG SL tablet Commonly known as: NITROSTAT Place 1 tablet (0.4 mg total) under the tongue every 5 (five) minutes as needed for chest pain.   OXYGEN Inhale into the lungs daily. 2L   pantoprazole 40 MG tablet Commonly known as: PROTONIX Take 40 mg by mouth 2 (two) times daily.        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   Tobacco use disorder, severe, dependence 08/26/2018   Past Surgical History:  Procedure  Laterality Date   BLADDER TUMOR EXCISION     CORONARY ANGIOPLASTY WITH STENT PLACEMENT     PROSTATE SURGERY     TONSILLECTOMY     Family History  Problem Relation Age of Onset   High blood pressure Mother    Heart failure Mother    Lung cancer Father    High blood pressure Sister    Fibromyalgia Sister    Social History   Socioeconomic History   Marital status: Divorced    Spouse name: Not on file   Number of children: 1   Years of education: 12   Highest education level: 12th grade  Occupational History   Occupation: Architect    Comment: retired  Tobacco Use   Smoking status: Every Day    Packs/day: 0.50    Years: 65.00    Pack years: 32.50    Types: Cigarettes   Smokeless tobacco: Never  Vaping Use   Vaping Use: Never used  Substance and Sexual Activity   Alcohol use: Not Currently    Alcohol/week: 0.0 standard drinks    Comment: 0   Drug use: Never   Sexual activity:  Not Currently    Partners: Female  Other Topics Concern   Not on file  Social History Narrative   Lives alone. His child lives in [place]. His sister and brother in law live in [place] in case he needs anything.   Social Determinants of Health   Financial Resource Strain: Low Risk    Difficulty of Paying Living Expenses: Not hard at all  Food Insecurity: No Food Insecurity   Worried About Charity fundraiser in the Last Year: Never true   [place] in the Last Year: Never true  Transportation Needs: No Transportation Needs   Lack of Transportation (Medical): No   Lack of Transportation (Non-Medical): No  Physical Activity: Inactive   Days of Exercise per Week: 0 days   Minutes of Exercise per Session: 0 min  Stress: No Stress Concern Present   Feeling of Stress : Not at all  Social Connections: Socially Isolated   Frequency of Communication with Friends and Family: Three times a week   Frequency of Social Gatherings with Friends and Family: Never   Attends Religious Services:  Never   Marine scientist or Organizations: No   Attends Archivist Meetings: Never   Marital Status: Divorced    Activities of Daily Living In your present state of health, do you have any difficulty performing the following activities: 11/13/2021  Hearing? 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 Left ear is way worse than the right.  Vision? Y  Comment stated that he needs new glasses. goes to the [place].  Difficulty concentrating or making decisions? N  Walking or climbing stairs? Y  Comment he does not climb stairs (severe COPD)  Dressing or bathing? N  Doing errands, shopping? Y  Comment he usually goes with his sister. he is working on getting a wheelchair carrier from the [place] so he can drive again.  Preparing Food and eating ? N  Using the Toilet? N  In the past six months, have you accidently leaked urine? N  Do you have problems with loss of bowel control? N  Managing your Medications? N  Managing your Finances? N  Housekeeping or managing your Housekeeping? N  Some recent data might be hidden    Patient Education/ Literacy How often do you need to have someone help you when you read instructions, pamphlets, or other written materials from your doctor or pharmacy?: 1 - Never What is the last grade level you completed in school?: 12th grade  Exercise Current Exercise Habits: The patient does not participate in regular exercise at present, Exercise limited by: respiratory conditions(s) (severe COPD.)  Diet Patient reports consuming 2 meals a day and 0 snack(s) a day Patient reports that his primary diet is: Regular Patient reports that she does have regular access to food.   Depression Screen PHQ 2/9 Scores 11/13/2021 10/17/2020 10/17/2020 06/23/2019 05/25/2019 08/26/2018  PHQ - 2 Score 0 1 1 1  0 0  PHQ- 9 Score - 1 1 3  - 1     Fall Risk Fall Risk  11/13/2021 10/17/2020 04/26/2020 05/25/2019  Falls in the past year? 0 1 1 0  Number falls in past yr: 0 1 0 -   Injury with Fall? 0 0 1 -  Risk for fall due to : No Fall Risks History of fall(s) Impaired mobility -  Follow up Falls evaluation completed Falls evaluation completed Education provided Falls prevention discussed     Objective:  Christopher Armstrong  seemed alert and oriented and he participated appropriately during our telephone visit.  Blood Pressure Weight BMI  BP Readings from Last 3 Encounters:  11/13/21 (!) 148/57  08/15/21 (!) 187/66  05/30/21 (!) 147/68   Wt Readings from Last 3 Encounters:  11/13/21 172 lb 8 oz (78.2 kg)  08/15/21 177 lb (80.3 kg)  05/25/21 180 lb (81.6 kg)   BMI Readings from Last 1 Encounters:  11/13/21 25.47 kg/m    *Unable to obtain current vital signs, weight, and BMI due to telephone visit type  Hearing/Vision  Christopher Armstrong did not seem to have difficulty with hearing/understanding during the telephone conversation Reports that he has not had a formal eye exam by an eye care professional within the past year Reports that he has not had a formal hearing evaluation within the past year *Unable to fully assess hearing and vision during telephone visit type  Cognitive Function: 6CIT Screen 11/13/2021 10/17/2020 05/25/2019  What Year? 0 points 0 points 0 points  What month? 0 points 0 points 0 points  What time? 0 points 0 points 0 points  Count back from 20 0 points 0 points 0 points  Months in reverse 0 points 0 points 0 points  Repeat phrase 0 points 0 points 2 points  Total Score 0 0 2   (Normal:0-7, Significant for Dysfunction: >8)  Normal Cognitive Function Screening: Yes   Immunization & Health Maintenance Record  There is no immunization history on file for this patient.  Health Maintenance  Topic Date Due   COVID-19 Vaccine (1) 11/29/2021 (Originally 02/17/1942)   Zoster Vaccines- Shingrix (1 of 2) 02/11/2022 (Originally 08/19/1960)   Pneumonia Vaccine 71+ Years old (1 - PCV) 11/13/2022 (Originally 08/19/1947)   TETANUS/TDAP  11/13/2022  (Originally 08/19/1960)   Hepatitis C Screening  11/13/2022 (Originally 08/19/1959)   HPV VACCINES  Aged Out   INFLUENZA VACCINE  Discontinued       Assessment  This is a routine wellness examination for [employer].  Health Maintenance: Due or Overdue There are no preventive care reminders to display for this patient.   Christopher Armstrong does not need a referral for Community Assistance: Care Management:   no Social Work:    no Prescription Assistance:  no Nutrition/Diabetes Education:  no   Plan:  Personalized Goals  Goals Addressed               This Visit's Progress     Patient Stated (pt-stated)        11/13/2021 AWV Goal: Improved Nutrition/Diet  Patient will verbalize understanding that diet plays an important role in overall health and that a poor diet is a risk factor for many chronic medical conditions.  Over the next year, patient will improve self management of their diet by incorporating more water. Patient will utilize available community resources to help with food acquisition if needed (ex: food pantries, [address], etc) Patient will work with nutrition specialist if a referral was made        Personalized Health Maintenance & Screening Recommendations  Pneumococcal vaccine  Influenza vaccine Td vaccine Shingrix vaccine  Patient declined all of the vaccines at this time.   Lung Cancer Screening Recommended: no; declined at this time. (Low Dose CT Chest recommended if Age 34-80 years, 30 pack-year currently smoking OR have quit w/in past 15 years) Hepatitis C Screening recommended: no HIV Screening recommended: no  Advanced Directives: Written information was not prepared per patient's request.  Referrals & Orders No  orders of the defined types were placed in this encounter.   Follow-up Plan Follow-up with [name], DO as planned Medicare wellness visit in one year. AVS printed and mailed to the patient.   I have personally reviewed  and noted the following in the patients chart:   Medical and social history Use of alcohol, tobacco or illicit drugs  Current medications and supplements Functional ability and status Nutritional status Physical activity Advanced directives List of other physicians Hospitalizations, surgeries, and ER visits in previous 12 months Vitals Screenings to include cognitive, depression, and falls Referrals and appointments  In addition, I have reviewed and discussed with Christopher Armstrong certain preventive protocols, quality metrics, and best practice recommendations. A written personalized care plan for preventive services as well as general preventive health recommendations is available and can be mailed to the patient at his request.      Tinnie Gens, RN  11/13/2021

## 2021-11-14 DIAGNOSIS — I480 Paroxysmal atrial fibrillation: Secondary | ICD-10-CM

## 2021-11-14 DIAGNOSIS — J449 Chronic obstructive pulmonary disease, unspecified: Secondary | ICD-10-CM | POA: Diagnosis not present

## 2021-11-14 DIAGNOSIS — I10 Essential (primary) hypertension: Secondary | ICD-10-CM | POA: Diagnosis not present

## 2021-11-15 ENCOUNTER — Other Ambulatory Visit: Payer: Self-pay | Admitting: Osteopathic Medicine

## 2021-12-04 DIAGNOSIS — J42 Unspecified chronic bronchitis: Secondary | ICD-10-CM | POA: Diagnosis not present

## 2022-01-08 DIAGNOSIS — J42 Unspecified chronic bronchitis: Secondary | ICD-10-CM | POA: Diagnosis not present

## 2022-01-15 ENCOUNTER — Ambulatory Visit: Payer: Medicare (Managed Care) | Admitting: Family Medicine

## 2022-01-23 ENCOUNTER — Telehealth: Payer: Medicare (Managed Care) | Admitting: Family Medicine

## 2022-02-20 DIAGNOSIS — J42 Unspecified chronic bronchitis: Secondary | ICD-10-CM | POA: Diagnosis not present

## 2022-03-06 ENCOUNTER — Encounter: Payer: Self-pay | Admitting: Family Medicine

## 2022-03-06 ENCOUNTER — Ambulatory Visit (INDEPENDENT_AMBULATORY_CARE_PROVIDER_SITE_OTHER): Payer: Medicare (Managed Care) | Admitting: Family Medicine

## 2022-03-06 VITALS — BP 166/67 | HR 77 | Ht 69.0 in | Wt 179.6 lb

## 2022-03-06 DIAGNOSIS — F17200 Nicotine dependence, unspecified, uncomplicated: Secondary | ICD-10-CM

## 2022-03-06 DIAGNOSIS — J449 Chronic obstructive pulmonary disease, unspecified: Secondary | ICD-10-CM | POA: Diagnosis not present

## 2022-03-06 DIAGNOSIS — I10 Essential (primary) hypertension: Secondary | ICD-10-CM | POA: Diagnosis not present

## 2022-03-06 DIAGNOSIS — G720 Drug-induced myopathy: Secondary | ICD-10-CM

## 2022-03-06 DIAGNOSIS — D485 Neoplasm of uncertain behavior of skin: Secondary | ICD-10-CM | POA: Insufficient documentation

## 2022-03-06 MED ORDER — NITROGLYCERIN 0.4 MG SL SUBL: 0.4000 mg | SUBLINGUAL_TABLET | SUBLINGUAL | 1 refills | Status: DC | PRN

## 2022-03-06 MED ORDER — FUROSEMIDE 40 MG PO TABS: ORAL_TABLET | ORAL | 3 refills | Status: DC

## 2022-03-06 NOTE — Assessment & Plan Note (Signed)
He is on chronic O2.  Stable at this time with current inhalers/neb treatments.

## 2022-03-06 NOTE — Progress Notes (Signed)
Christopher Armstrong - 81 y.o. male MRN [id number]  Date of birth: 1941/09/16  Subjective Chief Complaint  Patient presents with   warts    HPI Christopher Armstrong is a 81 year old male here today for follow-up visit.  Overall doing well at this time.  No new complaints or concerns.  Continues to see cardiology through the [place].Marland Kitchen  Rate controlled with carvedilol and anticoagulated with Eliquis due to history of atrial fibrillation.  Additionally he is taking lisinopril and amlodipine for blood pressure control.  Blood pressure is elevated here.  Reports he does have better readings at home.  He has not had chest pain or increased shortness of breath.  He is on chronic O2 due to severe COPD.  Stable with Symbicort and albuterol/DuoNebs as needed.  He does have some skin lesions on his hand and face that are changed over the past several months.  Denies pain or bleeding.  ROS:  A comprehensive ROS was completed and negative except as noted per HPI  Allergies  Allergen Reactions   Clopidogrel Other (See Comments)    "Purple blood pockets clots in mouth" Blood blisters in roof of mouth, urinary retention    Ticagrelor Shortness Of Breath and Other (See Comments)   Atorvastatin Other (See Comments)   Simvastatin    Statins Nausea And Vomiting and Other (See Comments)   Finasteride Other (See Comments)   Guaifenesin Other (See Comments)    Hypotension   Terazosin     Other reaction(s): Orthostatic hypotension    Past Medical History:  Diagnosis Date   BPH (benign prostatic hyperplasia)    CHF (congestive heart failure) ([place]) 06/23/2019   COPD (chronic obstructive pulmonary disease) with chronic bronchitis ([place]) 08/26/2018   Coronary artery disease of native artery of native heart with stable angina pectoris ([place]) 08/26/2018   Essential hypertension 08/26/2018   History of coronary artery stent placement 08/26/2018   Mixed hyperlipidemia 08/26/2018   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   Tobacco use disorder,  severe, dependence 08/26/2018    Past Surgical History:  Procedure Laterality Date   BLADDER TUMOR EXCISION     CORONARY ANGIOPLASTY WITH STENT PLACEMENT     PROSTATE SURGERY     TONSILLECTOMY      Social History   Socioeconomic History   Marital status: Divorced    Spouse name: Not on file   Number of children: 1   Years of education: 12   Highest education level: 12th grade  Occupational History   Occupation: Architect    Comment: retired  Tobacco Use   Smoking status: Every Day    Packs/day: 0.50    Years: 65.00    Pack years: 32.50    Types: Cigarettes   Smokeless tobacco: Never  Vaping Use   Vaping Use: Never used  Substance and Sexual Activity   Alcohol use: Not Currently    Alcohol/week: 0.0 standard drinks    Comment: 0   Drug use: Never   Sexual activity: Not Currently    Partners: Female  Other Topics Concern   Not on file  Social History Narrative   Lives alone. His child lives in [place]. His sister and brother in law live in [place] in case he needs anything.   Social Determinants of Health   Financial Resource Strain: Low Risk    Difficulty of Paying Living Expenses: Not hard at all  Food Insecurity: No Food Insecurity   Worried About Charity fundraiser in the Last Year: Never true  Ran Out of Food in the Last Year: Never true  Transportation Needs: No Transportation Needs   Lack of Transportation (Medical): No   Lack of Transportation (Non-Medical): No  Physical Activity: Inactive   Days of Exercise per Week: 0 days   Minutes of Exercise per Session: 0 min  Stress: No Stress Concern Present   Feeling of Stress : Not at all  Social Connections: Socially Isolated   Frequency of Communication with Friends and Family: Three times a week   Frequency of Social Gatherings with Friends and Family: Never   Attends Religious Services: Never   Marine scientist or Organizations: No   Attends Music therapist: Never   Marital  Status: Divorced    Family History  Problem Relation Age of Onset   High blood pressure Mother    Heart failure Mother    Lung cancer Father    High blood pressure Sister    Fibromyalgia Sister     Health Maintenance  Topic Date Due   COVID-19 Vaccine (1) Never done   Zoster Vaccines- Shingrix (1 of 2) Never done   Pneumonia Vaccine 26+ Years old (1 - PCV) 11/13/2022 (Originally 08/19/1947)   TETANUS/TDAP  11/13/2022 (Originally 08/19/1960)   Hepatitis C Screening  11/13/2022 (Originally 08/19/1959)   HPV VACCINES  Aged Out   INFLUENZA VACCINE  Discontinued     ----------------------------------------------------------------------------------------------------------------------------------------------------------------------------------------------------------------- Physical Exam BP (!) 166/67 (BP Location: Left Arm, Patient Position: Sitting, Cuff Size: Normal)   Pulse 77   Ht '5\' 9"'$  (1.753 m)   Wt 179 lb 9.6 oz (81.5 kg)   SpO2 97%   BMI 26.52 kg/m   Physical Exam Constitutional:      Appearance: Normal appearance.  Eyes:     General: No scleral icterus. Cardiovascular:     Rate and Rhythm: Normal rate and regular rhythm.  Pulmonary:     Effort: Pulmonary effort is normal.     Breath sounds: Normal breath sounds.     Comments: O2 via nasal cannula in place. Musculoskeletal:     Cervical back: Neck supple.  Neurological:     General: No focal deficit present.     Mental Status: He is alert.  Psychiatric:        Mood and Affect: Mood normal.        Behavior: Behavior normal.    ------------------------------------------------------------------------------------------------------------------------------------------------------------------------------------------------------------------- Assessment and Plan  Essential hypertension Blood pressure elevated today however reports readings at home are much better.  No increased shortness of breath or chest  pain.  COPD (chronic obstructive pulmonary disease) with chronic bronchitis ([place]) He is on chronic O2.  Stable at this time with current inhalers/neb treatments.  Statin myopathy Has intolerance to all statins.  His cardiologist did recommend injectable medication to control his cholesterol however he declined this.  Tobacco use disorder Continues to smoke regularly.  Declines any assistance with quitting.  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  Referral placed to dermatology.   Meds ordered this encounter  Medications   furosemide (LASIX) 40 MG tablet    Sig: TAKE 1/2 TO 1 (ONE-HALF TO ONE) TABLET BY MOUTH ONCE DAILY    Dispense:  90 tablet    Refill:  3   nitroGLYCERIN (NITROSTAT) 0.4 MG SL tablet    Sig: Place 1 tablet (0.4 mg total) under the tongue every 5 (five) minutes as needed for chest  pain.    Dispense:  20 tablet    Refill:  1    Return in about 6 months (around 09/06/2022) for HTN.    This visit occurred during the SARS-CoV-2 public health emergency.  Safety protocols were in place, including screening questions prior to the visit, additional usage of staff PPE, and extensive cleaning of exam room while observing appropriate contact time as indicated for disinfecting solutions.  She

## 2022-03-06 NOTE — Assessment & Plan Note (Signed)
He has several areas concerning for squamous cell skin cancer including area on the nose with keratin horn.  Referral placed to dermatology.

## 2022-03-06 NOTE — Assessment & Plan Note (Signed)
Has intolerance to all statins.  His cardiologist did recommend injectable medication to control his cholesterol however he declined this.

## 2022-03-06 NOTE — Assessment & Plan Note (Signed)
Continues to smoke regularly.  Declines any assistance with quitting.

## 2022-03-06 NOTE — Assessment & Plan Note (Signed)
Blood pressure elevated today however reports readings at home are much better.  No increased shortness of breath or chest pain.

## 2022-03-16 DIAGNOSIS — J42 Unspecified chronic bronchitis: Secondary | ICD-10-CM | POA: Diagnosis not present

## 2022-04-11 DIAGNOSIS — J42 Unspecified chronic bronchitis: Secondary | ICD-10-CM | POA: Diagnosis not present

## 2022-04-18 DIAGNOSIS — Z0000 Encounter for general adult medical examination without abnormal findings: Secondary | ICD-10-CM | POA: Diagnosis not present

## 2022-04-27 DIAGNOSIS — J42 Unspecified chronic bronchitis: Secondary | ICD-10-CM | POA: Diagnosis not present

## 2022-05-11 ENCOUNTER — Other Ambulatory Visit: Payer: Self-pay

## 2022-05-11 MED ORDER — LISINOPRIL 40 MG PO TABS: 40.0000 mg | ORAL_TABLET | Freq: Every day | ORAL | 3 refills | Status: DC

## 2022-05-18 DIAGNOSIS — J42 Unspecified chronic bronchitis: Secondary | ICD-10-CM | POA: Diagnosis not present

## 2022-05-30 ENCOUNTER — Other Ambulatory Visit: Payer: Self-pay | Admitting: Family Medicine

## 2022-06-12 DIAGNOSIS — J42 Unspecified chronic bronchitis: Secondary | ICD-10-CM | POA: Diagnosis not present

## 2022-06-28 ENCOUNTER — Ambulatory Visit (INDEPENDENT_AMBULATORY_CARE_PROVIDER_SITE_OTHER): Payer: Medicare (Managed Care) | Admitting: Pharmacist

## 2022-06-28 DIAGNOSIS — I10 Essential (primary) hypertension: Secondary | ICD-10-CM

## 2022-06-28 DIAGNOSIS — I480 Paroxysmal atrial fibrillation: Secondary | ICD-10-CM

## 2022-06-28 DIAGNOSIS — J449 Chronic obstructive pulmonary disease, unspecified: Secondary | ICD-10-CM

## 2022-06-28 NOTE — Progress Notes (Signed)
Chronic Care Management Pharmacy Note  06/29/2022 Name:  Christopher Armstrong MRN:  [id number] DOB:  1941/10/08  Summary: addressed HTN, COPD, Afib. Patient obtains eliquis through [place] for improved affordability. He has had several bleeding episodes, care provider at [facility] reduced dose to eliquis 2.'5mg'$  BID.   Patient was notified that symbicort & ventolin no longer available via patient assistance program -> he is requesting prescriptions sent to [facility].  Home BP readings: SBP 140s per patient  Recommendations/Changes made from today's visit:  - no changes - PCP to please send RX for inhalers to patient pharmacy  Plan: f/u with pharmacist in 6 months  Subjective: Christopher Armstrong is an 81 y.o. year old male who is a primary patient of [name], DO.  The CCM team was consulted for assistance with disease management and care coordination needs.    Engaged with patient by telephone for follow up visit in response to provider referral for pharmacy case management and/or care coordination services.   Consent to Services:  The patient was given information about Chronic Care Management services, agreed to services, and gave verbal consent prior to initiation of services.  Please see initial visit note for detailed documentation.   Patient Care Team: [name], DO as PCP - General (Family Medicine) Darius Bump, [facility] as Pharmacist (Pharmacist)   Objective:  Lab Results  Component Value Date   CREATININE 0.90 05/09/2020   CREATININE 1.00 04/26/2020   CREATININE 1.14 07/14/2019       Latest Ref Rng & Units 05/09/2020   11:13 AM 04/26/2020   12:12 PM 06/23/2019    9:57 AM  Hepatic Function  Total Protein 6.1 - 8.1 g/dL 7.0  7.1  6.9   AST 10 - 35 U/L '18  23  16   '$ ALT 9 - 46 U/L '13  18  18   '$ Total Bilirubin 0.2 - 1.2 mg/dL 0.6  0.8  0.5     Lab Results  Component Value Date/Time   TSH 1.37 05/09/2020 11:13 AM   TSH 1.59 09/25/2018 11:54 AM        Latest Ref Rng & Units 05/09/2020   11:13 AM 04/26/2020   12:12 PM 06/23/2019    9:57 AM  CBC  WBC 3.8 - 10.8 Thousand/uL 9.7  8.5  14.0   Hemoglobin 13.2 - 17.1 g/dL 13.8  13.8  14.5   Hematocrit 38.5 - 50.0 % 41.9  41.8  43.3   Platelets 140 - 400 Thousand/uL 147  152  137     Social History   Tobacco Use  Smoking Status Every Day   Packs/day: 0.50   Years: 65.00   Total pack years: 32.50   Types: Cigarettes  Smokeless Tobacco Never   BP Readings from Last 3 Encounters:  03/06/22 (!) 166/67  11/13/21 (!) 148/57  08/15/21 (!) 187/66   Pulse Readings from Last 3 Encounters:  03/06/22 77  11/13/21 62  08/15/21 80   Wt Readings from Last 3 Encounters:  03/06/22 179 lb 9.6 oz (81.5 kg)  11/13/21 172 lb 8 oz (78.2 kg)  08/15/21 177 lb (80.3 kg)    Assessment: Review of patient past medical history, allergies, medications, health status, including review of consultants reports, laboratory and other test data, was performed as part of comprehensive evaluation and provision of chronic care management services.   SDOH:  (Social Determinants of Health) assessments and interventions performed:  SDOH Interventions    [employer]  Visit from 11/13/2021 in [place] Office Visit from 10/17/2020 in [place] Interventions Intervention Not Indicated Intervention Not Indicated  Housing Interventions Intervention Not Indicated Intervention Not Indicated  Transportation Interventions Intervention Not Indicated Intervention Not Indicated  Financial Strain Interventions Intervention Not Indicated Intervention Not Indicated  Physical Activity Interventions Intervention Not Indicated Intervention Not Indicated  Stress Interventions Intervention Not Indicated Intervention Not Indicated  Social Connections Interventions Intervention Not Indicated Intervention Not  Indicated       CCM Care Plan  Allergies  Allergen Reactions   Clopidogrel Other (See Comments)    "Purple blood pockets clots in mouth" Blood blisters in roof of mouth, urinary retention    Ticagrelor Shortness Of Breath and Other (See Comments)   Atorvastatin Other (See Comments)   Simvastatin    Statins Nausea And Vomiting and Other (See Comments)   Finasteride Other (See Comments)   Guaifenesin Other (See Comments)    Hypotension   Terazosin     Other reaction(s): Orthostatic hypotension    Medications Reviewed Today     Reviewed by [name], DO (Physician) on 03/06/22 at 2131  Med List Status: <None>   Medication Order Taking? Sig Documenting Provider Last Dose Status Informant  albuterol (VENTOLIN HFA) 108 (90 Base) MCG/ACT inhaler [id number] Yes Inhale 1-2 puffs into the lungs every 6 (six) hours as needed for wheezing or shortness of breath. [name], DO Taking Active   AMBULATORY NON [place] [id number] Yes Motorized wheelchair per patient preference and insurance coverage. Patient suffers from copd and atrial fibrillation and debility d/t recent hospitalization which impairs their ability to perform daily activities like bathing, dressing, grooming, and toileting in the home.  A cane, crutch,walker or manual wheelchair will not resolve issue with performing activities of daily living. A  power wheelchair will allow patient to safely perform daily activities. Patient unable to safely propel the wheelchair and does not have a caregiver who can provide assistance. Length of need Lifetime.  Fax [phone] [name], DO Taking Active   amLODipine (NORVASC) 5 MG tablet [id number] Yes Take 1 tablet by mouth daily. [provider] Taking Active   apixaban (ELIQUIS) 5 MG TABS tablet [id number] Yes Take 1 tablet (5 mg total) by mouth 2 (two) times daily. [name], DO Taking Active   aspirin EC 81 MG tablet [id number] Yes Take 1 tablet (81  mg total) by mouth daily. Lelon Perla, MD Taking Active   budesonide-formoterol [facility]) 160-4.5 MCG/ACT inhaler [id number] Yes Inhale 2 puffs into the lungs 2 (two) times daily. [name], DO Taking Active   carvedilol (COREG) 12.5 MG tablet [id number]  Take 1.5 tablets (18.75 mg total) by mouth 2 (two) times daily with a meal.  Patient taking differently: Take 12.5 mg by mouth 2 (two) times daily with a meal.   [name], DO  Expired 11/13/21 2359   furosemide (LASIX) 40 MG tablet [id number]  TAKE 1/2 TO 1 (ONE-HALF TO ONE) TABLET BY MOUTH ONCE DAILY [name], DO  Active   ipratropium-albuterol (DUONEB) 0.5-2.5 (3) MG/3ML SOLN [id number] Yes USE 1 VIAL IN NEBULIZER 5 TIMES DAILY [name], DO Taking Active   lisinopril (ZESTRIL) 40 MG tablet [id number] Yes Take 1 tablet (40 mg total) by mouth daily. [name], DO Taking Active   magnesium hydroxide (MILK OF MAGNESIA) 400 MG/5ML suspension [id number] Yes Take 30 mLs by mouth as  needed. [provider] Taking Active   nitroGLYCERIN (NITROSTAT) 0.4 MG SL tablet [id number]  Place 1 tablet (0.4 mg total) under the tongue every 5 (five) minutes as needed for chest pain. [name], DO  Active   OXYGEN [id number] Yes Inhale into the lungs daily. 2L [provider] Taking Active            Med Note Carnella Gu[place] Mar 02, 2020 10:30 AM)              Patient Active Problem List   Diagnosis Date Noted   Neoplasm of uncertain behavior of skin 03/06/2022   Hypertrophy of prostate without urinary obstruction and other lower urinary tract symptoms (LUTS) 02/01/2021   Tobacco use disorder 02/01/2021   DNR no code (do not resuscitate) 05/17/2020   Dizziness 04/26/2020   Shortness of breath 11/05/2019   Hyperglycemia 11/04/2019   Bradycardia 11/04/2019   Bilateral lower extremity edema 11/04/2019   CHF (congestive heart failure) ([place]) 06/23/2019   COPD (chronic obstructive  pulmonary disease) with chronic bronchitis ([place]) 08/26/2018   Coronary artery disease of native artery of native heart with stable angina pectoris ([place]) 08/26/2018   Mixed hyperlipidemia 08/26/2018   Essential hypertension 08/26/2018   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  Conditions to be addressed/monitored: Atrial Fibrillation, HTN, and COPD  Care Plan : Medication Management  Updates made by Darius Bump, [place] since 06/29/2022 12:00 AM     Problem: HTN, COPD, Afib      Long-Range Goal: Disease Progression Prevention   Start Date: 07/25/2021  Recent Progress: On track  Priority: High  Note:   Current Barriers:  None at present  Pharmacist Clinical Goal(s):  Over the next 180 days, patient will maintain control of chronic conditions as evidenced by medication fill history, lab values, and vital signs  through collaboration with PharmD and provider.   Interventions: 1:1 collaboration with [name], DO regarding development and update of comprehensive plan of care as evidenced by provider attestation and co-signature Inter-disciplinary care team collaboration (see longitudinal plan of care) Comprehensive medication review performed; medication list updated in electronic medical record  Hypertension:  Controlled; current treatment:lisinopril '40mg'$  daily, diltiazem '30mg'$  PRN, coreg 12.'5mg'$  BID; furosemide '40mg'$ ,   Current home readings: SBP 145-155 per home health nurse, and SBP 120s in AM  Denies hypotensive/hypertensive symptoms  Recommended continue current regimen,  Chronic Obstructive Pulmonary Disease:  Controlled; current  treatment:symbicort BID, duonebs 5 times daily;   0 exacerbations requiring treatment in the last 6 months   Recommended continue current regimen, and  Atrial Fibrillation:  Controlled; current rate/rhythm control: diltiazem '30mg'$  PRN, coreg 12.'5mg'$  BID; anticoagulant treatment: apixaban 2.'5mg'$  BID  Recommended continue current regimen   Patient Goals/Self-Care Activities Over the next 180 days, patient will:  take medications as prescribed  Follow Up Plan: Telephone follow up appointment with care management team member scheduled for:  6 months        Medication Assistance: None required.  Patient affirms current coverage meets needs.  Patient's preferred pharmacy is:  [facility], [place] [address]  [place] [address] Phone: [phone] Fax: [facility], [place]. [place] [place] [address] Phone: [phone] Fax: [place], [place] [address] Phone: [phone] Fax: [phone]   Uses pill box? Yes Pt endorses 100% compliance  Follow Up:  Patient agrees to Care Plan and Follow-up.  Plan: Telephone follow up appointment with care management team member scheduled for:  6 months  Larinda Buttery, PharmD Clinical Pharmacist [facility] [phone]

## 2022-06-29 NOTE — Patient Instructions (Signed)
Visit Information  Thank you for taking time to visit with me today. Please don't hesitate to contact me if I can be of assistance to you before our next scheduled telephone appointment.  Following are the goals we discussed today:   Patient Goals/Self-Care Activities Over the next 180 days, patient will:  take medications as prescribed  Follow Up Plan: Telephone follow up appointment with care management team member scheduled for:  6 months   Please call the care guide team at [phone] if you need to cancel or reschedule your appointment.    The patient verbalized understanding of instructions, educational materials, and care plan provided today and agreed to receive a mailed copy of patient instructions, educational materials, and care plan.   Christopher Armstrong

## 2022-07-03 ENCOUNTER — Other Ambulatory Visit: Payer: Self-pay

## 2022-07-03 DIAGNOSIS — J449 Chronic obstructive pulmonary disease, unspecified: Secondary | ICD-10-CM

## 2022-07-03 MED ORDER — BUDESONIDE-FORMOTEROL FUMARATE 160-4.5 MCG/ACT IN AERO: 2.0000 | INHALATION_SPRAY | Freq: Two times a day (BID) | RESPIRATORY_TRACT | 5 refills | Status: DC

## 2022-07-03 MED ORDER — ALBUTEROL SULFATE HFA 108 (90 BASE) MCG/ACT IN AERS: 1.0000 | INHALATION_SPRAY | Freq: Four times a day (QID) | RESPIRATORY_TRACT | 5 refills | Status: DC | PRN

## 2022-07-03 NOTE — Telephone Encounter (Signed)
Last prescribed by Dr Alexander.  

## 2022-07-06 ENCOUNTER — Other Ambulatory Visit: Payer: Self-pay | Admitting: Family Medicine

## 2022-07-06 ENCOUNTER — Telehealth: Payer: Self-pay

## 2022-07-06 DIAGNOSIS — J449 Chronic obstructive pulmonary disease, unspecified: Secondary | ICD-10-CM

## 2022-07-06 NOTE — Telephone Encounter (Signed)
[place], [place] [address] [phone].  Per [employer], mailed complete new nebulizer kit with mouthpiece and hoses to patient via regular mail on 07/05/22.

## 2022-07-09 DIAGNOSIS — J42 Unspecified chronic bronchitis: Secondary | ICD-10-CM | POA: Diagnosis not present

## 2022-07-14 DIAGNOSIS — I4891 Unspecified atrial fibrillation: Secondary | ICD-10-CM

## 2022-07-14 DIAGNOSIS — I10 Essential (primary) hypertension: Secondary | ICD-10-CM

## 2022-07-14 DIAGNOSIS — J449 Chronic obstructive pulmonary disease, unspecified: Secondary | ICD-10-CM

## 2022-07-19 ENCOUNTER — Other Ambulatory Visit: Payer: Self-pay

## 2022-07-19 MED ORDER — FLUTICASONE FUROATE-VILANTEROL 100-25 MCG/ACT IN AEPB: 1.0000 | INHALATION_SPRAY | Freq: Every day | RESPIRATORY_TRACT | 0 refills | Status: AC

## 2022-07-24 DIAGNOSIS — I480 Paroxysmal atrial fibrillation: Secondary | ICD-10-CM | POA: Diagnosis not present

## 2022-07-24 DIAGNOSIS — Z7901 Long term (current) use of anticoagulants: Secondary | ICD-10-CM | POA: Diagnosis not present

## 2022-07-24 DIAGNOSIS — R062 Wheezing: Secondary | ICD-10-CM | POA: Diagnosis not present

## 2022-07-24 DIAGNOSIS — Z20822 Contact with and (suspected) exposure to covid-19: Secondary | ICD-10-CM | POA: Diagnosis not present

## 2022-07-24 DIAGNOSIS — J9621 Acute and chronic respiratory failure with hypoxia: Secondary | ICD-10-CM | POA: Diagnosis not present

## 2022-07-24 DIAGNOSIS — J449 Chronic obstructive pulmonary disease, unspecified: Secondary | ICD-10-CM | POA: Diagnosis not present

## 2022-07-24 DIAGNOSIS — F17210 Nicotine dependence, cigarettes, uncomplicated: Secondary | ICD-10-CM | POA: Diagnosis not present

## 2022-07-24 DIAGNOSIS — J9611 Chronic respiratory failure with hypoxia: Secondary | ICD-10-CM | POA: Insufficient documentation

## 2022-07-24 DIAGNOSIS — R600 Localized edema: Secondary | ICD-10-CM | POA: Diagnosis not present

## 2022-07-24 DIAGNOSIS — Z720 Tobacco use: Secondary | ICD-10-CM | POA: Diagnosis not present

## 2022-07-24 DIAGNOSIS — R069 Unspecified abnormalities of breathing: Secondary | ICD-10-CM | POA: Diagnosis not present

## 2022-07-24 DIAGNOSIS — J189 Pneumonia, unspecified organism: Secondary | ICD-10-CM | POA: Diagnosis not present

## 2022-07-24 DIAGNOSIS — E785 Hyperlipidemia, unspecified: Secondary | ICD-10-CM | POA: Diagnosis not present

## 2022-07-24 DIAGNOSIS — R059 Cough, unspecified: Secondary | ICD-10-CM | POA: Diagnosis not present

## 2022-07-24 DIAGNOSIS — K5900 Constipation, unspecified: Secondary | ICD-10-CM | POA: Diagnosis not present

## 2022-07-24 DIAGNOSIS — R0602 Shortness of breath: Secondary | ICD-10-CM | POA: Diagnosis not present

## 2022-07-24 DIAGNOSIS — I10 Essential (primary) hypertension: Secondary | ICD-10-CM | POA: Diagnosis not present

## 2022-07-24 DIAGNOSIS — J441 Chronic obstructive pulmonary disease with (acute) exacerbation: Secondary | ICD-10-CM | POA: Diagnosis not present

## 2022-07-24 DIAGNOSIS — I5032 Chronic diastolic (congestive) heart failure: Secondary | ICD-10-CM | POA: Diagnosis not present

## 2022-07-25 DIAGNOSIS — J441 Chronic obstructive pulmonary disease with (acute) exacerbation: Secondary | ICD-10-CM | POA: Diagnosis not present

## 2022-07-25 DIAGNOSIS — R2243 Localized swelling, mass and lump, lower limb, bilateral: Secondary | ICD-10-CM | POA: Diagnosis not present

## 2022-07-26 DIAGNOSIS — F17210 Nicotine dependence, cigarettes, uncomplicated: Secondary | ICD-10-CM | POA: Diagnosis not present

## 2022-07-26 DIAGNOSIS — J441 Chronic obstructive pulmonary disease with (acute) exacerbation: Secondary | ICD-10-CM | POA: Diagnosis not present

## 2022-07-27 DIAGNOSIS — F17210 Nicotine dependence, cigarettes, uncomplicated: Secondary | ICD-10-CM | POA: Diagnosis not present

## 2022-07-27 DIAGNOSIS — K5710 Diverticulosis of small intestine without perforation or abscess without bleeding: Secondary | ICD-10-CM | POA: Diagnosis not present

## 2022-07-27 DIAGNOSIS — R918 Other nonspecific abnormal finding of lung field: Secondary | ICD-10-CM | POA: Diagnosis not present

## 2022-07-27 DIAGNOSIS — J441 Chronic obstructive pulmonary disease with (acute) exacerbation: Secondary | ICD-10-CM | POA: Diagnosis not present

## 2022-07-27 DIAGNOSIS — J9811 Atelectasis: Secondary | ICD-10-CM | POA: Diagnosis not present

## 2022-07-27 DIAGNOSIS — N281 Cyst of kidney, acquired: Secondary | ICD-10-CM | POA: Diagnosis not present

## 2022-07-27 DIAGNOSIS — N289 Disorder of kidney and ureter, unspecified: Secondary | ICD-10-CM | POA: Diagnosis not present

## 2022-07-27 DIAGNOSIS — K429 Umbilical hernia without obstruction or gangrene: Secondary | ICD-10-CM | POA: Diagnosis not present

## 2022-07-27 DIAGNOSIS — J984 Other disorders of lung: Secondary | ICD-10-CM | POA: Diagnosis not present

## 2022-07-27 DIAGNOSIS — J439 Emphysema, unspecified: Secondary | ICD-10-CM | POA: Diagnosis not present

## 2022-07-28 DIAGNOSIS — J441 Chronic obstructive pulmonary disease with (acute) exacerbation: Secondary | ICD-10-CM | POA: Diagnosis not present

## 2022-07-28 DIAGNOSIS — F17210 Nicotine dependence, cigarettes, uncomplicated: Secondary | ICD-10-CM | POA: Diagnosis not present

## 2022-07-29 DIAGNOSIS — F17210 Nicotine dependence, cigarettes, uncomplicated: Secondary | ICD-10-CM | POA: Diagnosis not present

## 2022-07-29 DIAGNOSIS — J441 Chronic obstructive pulmonary disease with (acute) exacerbation: Secondary | ICD-10-CM | POA: Diagnosis not present

## 2022-08-02 DIAGNOSIS — Z9981 Dependence on supplemental oxygen: Secondary | ICD-10-CM | POA: Diagnosis not present

## 2022-08-02 DIAGNOSIS — Z955 Presence of coronary angioplasty implant and graft: Secondary | ICD-10-CM | POA: Diagnosis not present

## 2022-08-02 DIAGNOSIS — Z7952 Long term (current) use of systemic steroids: Secondary | ICD-10-CM | POA: Diagnosis not present

## 2022-08-02 DIAGNOSIS — Z79899 Other long term (current) drug therapy: Secondary | ICD-10-CM | POA: Diagnosis not present

## 2022-08-02 DIAGNOSIS — Z7982 Long term (current) use of aspirin: Secondary | ICD-10-CM | POA: Diagnosis not present

## 2022-08-02 DIAGNOSIS — R911 Solitary pulmonary nodule: Secondary | ICD-10-CM | POA: Diagnosis not present

## 2022-08-02 DIAGNOSIS — Z9181 History of falling: Secondary | ICD-10-CM | POA: Diagnosis not present

## 2022-08-02 DIAGNOSIS — Z7901 Long term (current) use of anticoagulants: Secondary | ICD-10-CM | POA: Diagnosis not present

## 2022-08-02 DIAGNOSIS — K5900 Constipation, unspecified: Secondary | ICD-10-CM | POA: Diagnosis not present

## 2022-08-02 DIAGNOSIS — Z8616 Personal history of COVID-19: Secondary | ICD-10-CM | POA: Diagnosis not present

## 2022-08-02 DIAGNOSIS — Z7951 Long term (current) use of inhaled steroids: Secondary | ICD-10-CM | POA: Diagnosis not present

## 2022-08-02 DIAGNOSIS — J441 Chronic obstructive pulmonary disease with (acute) exacerbation: Secondary | ICD-10-CM | POA: Diagnosis not present

## 2022-08-02 DIAGNOSIS — I252 Old myocardial infarction: Secondary | ICD-10-CM | POA: Diagnosis not present

## 2022-08-17 DIAGNOSIS — Z7952 Long term (current) use of systemic steroids: Secondary | ICD-10-CM | POA: Diagnosis not present

## 2022-08-17 DIAGNOSIS — I252 Old myocardial infarction: Secondary | ICD-10-CM | POA: Diagnosis not present

## 2022-08-17 DIAGNOSIS — Z9981 Dependence on supplemental oxygen: Secondary | ICD-10-CM | POA: Diagnosis not present

## 2022-08-17 DIAGNOSIS — Z955 Presence of coronary angioplasty implant and graft: Secondary | ICD-10-CM | POA: Diagnosis not present

## 2022-08-17 DIAGNOSIS — R911 Solitary pulmonary nodule: Secondary | ICD-10-CM | POA: Diagnosis not present

## 2022-08-17 DIAGNOSIS — J441 Chronic obstructive pulmonary disease with (acute) exacerbation: Secondary | ICD-10-CM | POA: Diagnosis not present

## 2022-08-17 DIAGNOSIS — Z7901 Long term (current) use of anticoagulants: Secondary | ICD-10-CM | POA: Diagnosis not present

## 2022-08-17 DIAGNOSIS — Z7951 Long term (current) use of inhaled steroids: Secondary | ICD-10-CM | POA: Diagnosis not present

## 2022-08-17 DIAGNOSIS — Z7982 Long term (current) use of aspirin: Secondary | ICD-10-CM | POA: Diagnosis not present

## 2022-08-17 DIAGNOSIS — Z79899 Other long term (current) drug therapy: Secondary | ICD-10-CM | POA: Diagnosis not present

## 2022-08-17 DIAGNOSIS — Z9181 History of falling: Secondary | ICD-10-CM | POA: Diagnosis not present

## 2022-08-17 DIAGNOSIS — Z8616 Personal history of COVID-19: Secondary | ICD-10-CM | POA: Diagnosis not present

## 2022-08-17 DIAGNOSIS — K5900 Constipation, unspecified: Secondary | ICD-10-CM | POA: Diagnosis not present

## 2022-08-20 ENCOUNTER — Other Ambulatory Visit: Payer: Self-pay

## 2022-08-20 DIAGNOSIS — J4489 Other specified chronic obstructive pulmonary disease: Secondary | ICD-10-CM

## 2022-08-20 MED ORDER — IPRATROPIUM-ALBUTEROL 0.5-2.5 (3) MG/3ML IN SOLN: 3.0000 mL | RESPIRATORY_TRACT | 11 refills | Status: DC | PRN

## 2022-08-22 ENCOUNTER — Telehealth: Payer: Self-pay | Admitting: Family Medicine

## 2022-08-22 NOTE — Telephone Encounter (Signed)
Adultl and pediatric specialist from [facility]  came of  behalf of patient . He is needing a Nebulizer wanting a referral if possible [facility] [phone].  Customer service fax is [phone].

## 2022-08-24 ENCOUNTER — Telehealth: Payer: Self-pay

## 2022-08-24 NOTE — Telephone Encounter (Signed)
Christopher Armstrong with [facility] ( did not leave a return number)  Called requesting  "the med " ( did not leave medication name) be faxed  to [phone]  And patient asking for nebulizer order as well.   Panya, are you familiar with anything as above ? If not, I try to figure out how to call her back.

## 2022-08-28 NOTE — Telephone Encounter (Signed)
Contacted Matt @ [facility] concerning neb solution and nebulizer equipment. Catalina Antigua states he will callback after researching the chart. Everardo All that the Rx was faxed thru the system and sent via manual fax to [phone]) with chart notes twice already. I've called [facility] directly and faxed them the requested information. The patient has not received shipment.

## 2022-08-29 ENCOUNTER — Other Ambulatory Visit: Payer: Self-pay

## 2022-08-29 DIAGNOSIS — J4489 Other specified chronic obstructive pulmonary disease: Secondary | ICD-10-CM

## 2022-08-29 MED ORDER — IPRATROPIUM-ALBUTEROL 0.5-2.5 (3) MG/3ML IN SOLN: 3.0000 mL | RESPIRATORY_TRACT | 11 refills | Status: DC | PRN

## 2022-08-29 MED ORDER — RESPIRATORY THERAPY SUPPLIES MISC: 3 refills | Status: DC

## 2022-08-29 NOTE — Telephone Encounter (Signed)
[facility] Rep Rodman Key came to the office after receiving a phone call from [facility] on Tuesday, 11/14 concerning Mr. Renzulli's equipment and refills.   Per [place], faxed Rx for neb solution (30+mL) and nebulizer tubing along with chart notes and insurance information.  (Fax: [phone])

## 2022-08-30 DIAGNOSIS — J449 Chronic obstructive pulmonary disease, unspecified: Secondary | ICD-10-CM | POA: Diagnosis not present

## 2022-08-30 DIAGNOSIS — J42 Unspecified chronic bronchitis: Secondary | ICD-10-CM | POA: Diagnosis not present

## 2022-09-05 DIAGNOSIS — J42 Unspecified chronic bronchitis: Secondary | ICD-10-CM | POA: Diagnosis not present

## 2022-09-17 ENCOUNTER — Telehealth: Payer: Self-pay | Admitting: Family Medicine

## 2022-09-17 NOTE — Telephone Encounter (Signed)
Patient's sister is requesting home health for help bathing, shaving, and personal care. She would like someone to contact her and let her know what she needs to do. [phone]

## 2022-09-17 NOTE — Telephone Encounter (Signed)
Can see if we can get home health aide but this will likely be out of pocket.  We can see if insurance will cover part of this but would need a visit for home health orders.  Virtual visit is fine.

## 2022-09-18 DIAGNOSIS — Z7952 Long term (current) use of systemic steroids: Secondary | ICD-10-CM | POA: Diagnosis not present

## 2022-09-18 DIAGNOSIS — Z9981 Dependence on supplemental oxygen: Secondary | ICD-10-CM | POA: Diagnosis not present

## 2022-09-18 DIAGNOSIS — Z955 Presence of coronary angioplasty implant and graft: Secondary | ICD-10-CM | POA: Diagnosis not present

## 2022-09-18 DIAGNOSIS — Z79899 Other long term (current) drug therapy: Secondary | ICD-10-CM | POA: Diagnosis not present

## 2022-09-18 DIAGNOSIS — Z7951 Long term (current) use of inhaled steroids: Secondary | ICD-10-CM | POA: Diagnosis not present

## 2022-09-18 DIAGNOSIS — J441 Chronic obstructive pulmonary disease with (acute) exacerbation: Secondary | ICD-10-CM | POA: Diagnosis not present

## 2022-09-18 DIAGNOSIS — R911 Solitary pulmonary nodule: Secondary | ICD-10-CM | POA: Diagnosis not present

## 2022-09-18 DIAGNOSIS — K5900 Constipation, unspecified: Secondary | ICD-10-CM | POA: Diagnosis not present

## 2022-09-18 DIAGNOSIS — Z7901 Long term (current) use of anticoagulants: Secondary | ICD-10-CM | POA: Diagnosis not present

## 2022-09-18 DIAGNOSIS — Z8616 Personal history of COVID-19: Secondary | ICD-10-CM | POA: Diagnosis not present

## 2022-09-18 DIAGNOSIS — I252 Old myocardial infarction: Secondary | ICD-10-CM | POA: Diagnosis not present

## 2022-09-18 DIAGNOSIS — Z7982 Long term (current) use of aspirin: Secondary | ICD-10-CM | POA: Diagnosis not present

## 2022-09-18 DIAGNOSIS — Z9181 History of falling: Secondary | ICD-10-CM | POA: Diagnosis not present

## 2022-09-19 ENCOUNTER — Encounter: Payer: Self-pay | Admitting: Family Medicine

## 2022-09-19 ENCOUNTER — Telehealth (INDEPENDENT_AMBULATORY_CARE_PROVIDER_SITE_OTHER): Payer: Medicare (Managed Care) | Admitting: Family Medicine

## 2022-09-19 VITALS — Ht 69.0 in | Wt 179.6 lb

## 2022-09-19 DIAGNOSIS — M79676 Pain in unspecified toe(s): Secondary | ICD-10-CM | POA: Diagnosis not present

## 2022-09-19 DIAGNOSIS — J9611 Chronic respiratory failure with hypoxia: Secondary | ICD-10-CM

## 2022-09-19 DIAGNOSIS — I5032 Chronic diastolic (congestive) heart failure: Secondary | ICD-10-CM

## 2022-09-19 DIAGNOSIS — J4489 Other specified chronic obstructive pulmonary disease: Secondary | ICD-10-CM

## 2022-09-19 NOTE — Progress Notes (Signed)
Christopher Armstrong - 81 y.o. male MRN [id number]  Date of birth: May 11, 1941   This visit type was conducted due to national recommendations for restrictions regarding the COVID-19 Pandemic (e.g. social distancing).  This format is felt to be most appropriate for this patient at this time.  All issues noted in this document were discussed and addressed.  No physical exam was performed (except for noted visual exam findings with Video Visits).  I discussed the limitations of evaluation and management by telemedicine and the availability of in person appointments. The patient expressed understanding and agreed to proceed.  I connected withNAME@ on 09/19/22 at  1:10 PM EST by a video enabled telemedicine application and verified that I am speaking with the correct person using two identifiers.  Present at visit: [name], DO [name]   Patient Location: [facility] [address] [place] [address]   Provider location:   [place] Complaint  Patient presents with   Home Health Orders    HPI  Christopher Armstrong is a 81 y.o. male who presents via audio/video conferencing for a telehealth visit today.  He follows today for history of severe O2 dependent COPD and CHF.  His sister is with him as well.  He reports that he has had increased difficulty with ADLs including grooming, bathing and dressing himself.  He is asking for a home health aide to assist with this.  He currently has home health through another company for physical therapy.  He has been receiving this since his hospitalization in October for COPD exacerbation.  Additionally he has thickened, overgrown toenails that he would like to have treated.  Requesting referral to podiatry.   ROS:  A comprehensive ROS was completed and negative except as noted per HPI  Past Medical History:  Diagnosis Date   BPH (benign prostatic hyperplasia)    CHF (congestive heart failure) ([place]) 06/23/2019   COPD (chronic  obstructive pulmonary disease) with chronic bronchitis 08/26/2018   Coronary artery disease of native artery of native heart with stable angina pectoris ([place]) 08/26/2018   Essential hypertension 08/26/2018   History of coronary artery stent placement 08/26/2018   Mixed hyperlipidemia 08/26/2018   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   Tobacco use disorder, severe, dependence 08/26/2018    Past Surgical History:  Procedure Laterality Date   BLADDER TUMOR EXCISION     CORONARY ANGIOPLASTY WITH STENT PLACEMENT     PROSTATE SURGERY     TONSILLECTOMY      Family History  Problem Relation Age of Onset   High blood pressure Mother    Heart failure Mother    Lung cancer Father    High blood pressure Sister    Fibromyalgia Sister     Social History   Socioeconomic History   Marital status: Divorced    Spouse name: Not on file   Number of children: 1   Years of education: 12   Highest education level: 12th grade  Occupational History   Occupation: Architect    Comment: retired  Tobacco Use   Smoking status: Every Day    Packs/day: 0.50    Years: 65.00    Total pack years: 32.50    Types: Cigarettes   Smokeless tobacco: Never  Vaping Use   Vaping Use: Never used  Substance and Sexual Activity   Alcohol use: Not Currently    Alcohol/week: 0.0 standard drinks of alcohol    Comment: 0   Drug use: Never  Sexual activity: Not Currently    Partners: Female  Other Topics Concern   Not on file  Social History Narrative   Lives alone. His child lives in [place]. His sister and brother in law live in [place] in case he needs anything.   Social Determinants of Health   Financial Resource Strain: Low Risk  (11/13/2021)   Overall Financial Resource Strain (CARDIA)    Difficulty of Paying Living Expenses: Not hard at all  Food Insecurity: No Food Insecurity (11/13/2021)   Hunger Vital Sign    Worried About Running Out of Food in the Last Year: Never true    Ran Out of Food in the  Last Year: Never true  Transportation Needs: No Transportation Needs (11/13/2021)   PRAPARE - Hydrologist (Medical): No    Lack of Transportation (Non-Medical): No  Physical Activity: Inactive (11/13/2021)   Exercise Vital Sign    Days of Exercise per Week: 0 days    Minutes of Exercise per Session: 0 min  Stress: No Stress Concern Present (11/13/2021)   [name]    Feeling of Stress : Not at all  Social Connections: Socially Isolated (11/13/2021)   Social Connection and Isolation Panel [NHANES]    Frequency of Communication with Friends and Family: Three times a week    Frequency of Social Gatherings with Friends and Family: Never    Attends Religious Services: Never    Marine scientist or Organizations: No    Attends Archivist Meetings: Never    Marital Status: Divorced  Human resources officer Violence: Not At Risk (11/13/2021)   Humiliation, Afraid, Rape, and Kick questionnaire    Fear of Current or Ex-Partner: No    Emotionally Abused: No    Physically Abused: No    Sexually Abused: No     Current Outpatient Medications:    albuterol (VENTOLIN HFA) 108 (90 Base) MCG/ACT inhaler, Inhale 1-2 puffs into the lungs every 6 (six) hours as needed for wheezing or shortness of breath., Disp: 18 g, Rfl: 5   AMBULATORY NON FORMULARY MEDICATION, Motorized wheelchair per patient preference and insurance coverage. Patient suffers from copd and atrial fibrillation and debility d/t recent hospitalization which impairs their ability to perform daily activities like bathing, dressing, grooming, and toileting in the home.  A cane, crutch,walker or manual wheelchair will not resolve issue with performing activities of daily living. A  power wheelchair will allow patient to safely perform daily activities. Patient unable to safely propel the wheelchair and does not have a caregiver who can provide  assistance. Length of need Lifetime.  Fax [phone], Disp: 1 Units, Rfl: 99   amLODipine (NORVASC) 5 MG tablet, Take 1 tablet by mouth daily., Disp: , Rfl:    apixaban (ELIQUIS) 5 MG TABS tablet, Take 1 tablet (5 mg total) by mouth 2 (two) times daily. (Patient taking differently: Take 2.5 mg by mouth 2 (two) times daily.), Disp: 60 tablet, Rfl: 2   aspirin EC 81 MG tablet, Take 1 tablet (81 mg total) by mouth daily., Disp: 90 tablet, Rfl: 3   budesonide-formoterol (SYMBICORT) 160-4.5 MCG/ACT inhaler, Inhale 2 puffs into the lungs daily., Disp: , Rfl:    diltiazem (CARDIZEM) 30 MG tablet, Take by mouth., Disp: , Rfl:    furosemide (LASIX) 40 MG tablet, TAKE 1/2 TO 1 (ONE-HALF TO ONE) TABLET BY MOUTH ONCE DAILY, Disp: 90 tablet, Rfl: 3   ipratropium-albuterol (DUONEB) 0.5-2.5 (  3) MG/3ML SOLN, Take 3 mLs by nebulization every 4 (four) hours as needed., Disp: 30 mL, Rfl: 11   lisinopril (ZESTRIL) 40 MG tablet, Take 1 tablet (40 mg total) by mouth daily., Disp: 90 tablet, Rfl: 3   magnesium hydroxide (MILK OF MAGNESIA) 400 MG/5ML suspension, Take 30 mLs by mouth as needed., Disp: , Rfl:    nitroGLYCERIN (NITROSTAT) 0.4 MG SL tablet, Place 1 tablet (0.4 mg total) under the tongue every 5 (five) minutes as needed for chest pain., Disp: 20 tablet, Rfl: 1   OXYGEN, Inhale into the lungs daily. 2L, Disp: , Rfl:    Respiratory Therapy Supplies MISC, Nebulizer tubing, Disp: 3 Units, Rfl: 3   carvedilol (COREG) 12.5 MG tablet, Take 1.5 tablets (18.75 mg total) by mouth 2 (two) times daily with a meal. (Patient taking differently: Take 12.5 mg by mouth 2 (two) times daily with a meal.), Disp: 270 tablet, Rfl: 3  EXAM:  VITALS per patient if applicable: Ht '5\' 9"'$  (1.753 m)   Wt 179 lb 9.6 oz (81.5 kg)   BMI 26.52 kg/m   GENERAL: alert, oriented, appears well and in no acute distress  HEENT: atraumatic, conjunttiva clear, no obvious abnormalities on inspection of external nose and ears  NECK: normal  movements of the head and neck  LUNGS: on inspection no signs of respiratory distress, breathing rate appears normal, no obvious gross SOB, gasping or wheezing  CV: no obvious cyanosis  MS: moves all visible extremities without noticeable abnormality  PSYCH/NEURO: pleasant and cooperative, no obvious depression or anxiety, speech and thought processing grossly intact  ASSESSMENT AND PLAN:  Discussed the following assessment and plan:  Chronic respiratory failure with hypoxia (HCC) He has chronic respiratory failure 2/2 to COPD.  He is O2 Dependent.  He is having increasing difficulty with ADL's including grooming, bathing and dressing himself.  Referral placed for home health aide.  Referral placed to social work and case management for additional resources as well.   Pain around toenail Appears to have significant onychomycosis and nail thickening.  Referral placed to podiatry.      I discussed the assessment and treatment plan with the patient. The patient was provided an opportunity to ask questions and all were answered. The patient agreed with the plan and demonstrated an understanding of the instructions.   The patient was advised to call back or seek an in-person evaluation if the symptoms worsen or if the condition fails to improve as anticipated.    [name], DO

## 2022-09-19 NOTE — Assessment & Plan Note (Signed)
He has chronic respiratory failure 2/2 to COPD.  He is O2 Dependent.  He is having increasing difficulty with ADL's including grooming, bathing and dressing himself.  Referral placed for home health aide.  Referral placed to social work and case management for additional resources as well.

## 2022-09-19 NOTE — Assessment & Plan Note (Signed)
Appears to have significant onychomycosis and nail thickening.  Referral placed to podiatry.

## 2022-09-20 ENCOUNTER — Telehealth: Payer: Self-pay

## 2022-09-20 ENCOUNTER — Other Ambulatory Visit: Payer: Self-pay | Admitting: Osteopathic Medicine

## 2022-09-20 ENCOUNTER — Other Ambulatory Visit: Payer: Self-pay

## 2022-09-20 MED ORDER — CARVEDILOL 12.5 MG PO TABS: 12.5000 mg | ORAL_TABLET | Freq: Two times a day (BID) | ORAL | 0 refills | Status: DC

## 2022-09-20 NOTE — Telephone Encounter (Signed)
A home health agent from [facility] called and states his insurance is not in network can you please make sure this gets to the right place thank you for all you do.

## 2022-09-21 ENCOUNTER — Telehealth: Payer: Self-pay

## 2022-09-21 NOTE — Progress Notes (Signed)
  Chronic Care Management   Note  09/21/2022 Name: Christopher Armstrong MRN: [id number] DOB: October 26, 1940  [name] is a 81 y.o. year old male who is a primary care patient of [name], DO. I reached out to [name] by phone today in response to a referral sent by Mr. Zaylan Kissoon [facility] PCP.  Mr. MAXAMILIAN AMADON  agreedto scheduling an appointment with the CCM RN Case Manager   Follow up plan: Patient agreed to scheduled appointment with RN Case Manager on 10/09/2022(date/time).   Noreene Larsson, [place], [place] [address] Direct Dial: [phone] [email or web address]

## 2022-09-21 NOTE — Progress Notes (Signed)
  Care Coordination  Note  09/21/2022 Name: Christopher Armstrong MRN: [id number] DOB: 1941-03-21  Christopher Armstrong is a 81 y.o. year old male who is a primary care patient of [name], DO. I reached out to Christopher Armstrong by phone today to offer care coordination services.      Christopher Armstrong was given information about Care Coordination services today including:  The Care Coordination services include support from the care team which includes your Nurse Coordinator, Clinical Social Worker, or Pharmacist.  The Care Coordination team is here to help remove barriers to the health concerns and goals most important to you. Care Coordination services are voluntary and the patient may decline or stop services at any time by request to their care team member.   Patient agreed to services and verbal consent obtained.   Follow up plan: Telephone appointment with care coordination team member scheduled for:09/25/2022  Christopher Armstrong, [place], [place] [address] Direct Dial: [phone] [email or web address]

## 2022-09-21 NOTE — Progress Notes (Signed)
  Chronic Care Management   Note  09/21/2022 Name: Christopher Armstrong MRN: [id number] DOB: [birth year]  Christopher Armstrong is a 81 y.o. year old male who is a primary care patient of [name], DO. I reached out to [name] by phone today in response to a referral sent by Christopher Armstrong.  Christopher Armstrong was not successfully contacted today. A HIPAA compliant voice message was left requesting a return call.   Follow up plan: Additional outreach attempts will be made.  Noreene Larsson, [place], [place] [address] Direct Dial: [phone] [email or web address]

## 2022-09-25 ENCOUNTER — Ambulatory Visit: Payer: Self-pay | Admitting: Licensed Clinical Social Worker

## 2022-09-25 NOTE — Telephone Encounter (Signed)
Sent this to [facility]! -Buel Ream

## 2022-09-27 NOTE — Patient Outreach (Signed)
  Care Coordination Late Entry/Documentation  Initial Visit Note   Outreach completed on 09/25/22 Name: Christopher Armstrong MRN: [id number] DOB: [birth year]  Christopher Armstrong is a 81 y.o. year old male who sees Christopher Nutting, DO for primary care. I spoke with  Christopher Armstrong by phone today.  What matters to the patients health and wellness today?  Personal Care Aid    Goals Addressed             This Visit's Progress    Obtain Personal Care Aid   On track    Care Coordination Interventions: Solution-Focused Strategies employed:  Active listening / Reflection utilized  Emotional Support Provided Verbalization of feelings encouraged  Patient reports difficulty completing ADL's and is interested in obtaining an aid. Patient utilizes a shower chair and has installed hand rails and a removable shower head to promote safety while bathing. Still needs assistance due to tub Pt is a English as a second language teacher. [facility] and [facility]s via mail from [place] Patient has hired assistance from a friend to assist with grocery shopping and cleaning home. Pt's sister and brother in law visits him routinely Patient participates in [facility]. He receives food assistance through [employer] 5x weekly and transportation through insurance noting inconsistency with services. LCSW informed him that transportation assistance can be provided through [facility] to medical appts Patient has a motorized wheelchair and lift kit in his vehicle to assist with mobility. Pt is on oxygen 24/7  LCSW will provide resources for aid assistance through [place]            SDOH assessments and interventions completed:  Yes  SDOH Interventions Today    Flowsheet Row Most Recent Value  SDOH Interventions   Food Insecurity Interventions Intervention Not Indicated  Housing Interventions Intervention Not Indicated  Transportation Interventions Intervention Not Indicated        Care Coordination Interventions:  Yes, provided   Follow up plan:  Follow up call scheduled for 1-2 weeks    Encounter Outcome:  Pt. Visit Completed   Christopher Armstrong, MSW, [email or web address] Phone [phone] 9:08 AM

## 2022-09-27 NOTE — Patient Instructions (Signed)
Visit Information  Thank you for taking time to visit with me today. Please don't hesitate to contact me if I can be of assistance to you.   Following are the goals we discussed today:   Goals Addressed             This Visit's Progress    Obtain Personal Care Aid   On track    Care Coordination Interventions: Solution-Focused Strategies employed:  Active listening / Reflection utilized  Emotional Support Provided Verbalization of feelings encouraged  Patient reports difficulty completing ADL's and is interested in obtaining an aid. Patient utilizes a shower chair and has installed hand rails and a removable shower head to promote safety while bathing. Still needs assistance due to tub Pt is a English as a second language teacher. [facility] and [facility] some meds via mail from [place] Patient has hired assistance from a friend to assist with grocery shopping and cleaning home. Pt's sister and brother in law visits him routinely Patient participates in [facility]. He receives food assistance through [employer] 5x weekly and transportation through insurance noting inconsistency with services. LCSW informed him that transportation assistance can be provided through [facility] to medical appts Patient has a motorized wheelchair and lift kit in his vehicle to assist with mobility. Pt is on oxygen 24/7  LCSW will provide resources for aid assistance through [place] next appointment is by telephone on 10/04/22 at 9 AM  Please call the care guide team at [phone] if you need to cancel or reschedule your appointment.   If you are experiencing a Mental Health or [place] or need someone to talk to, please call the Suicide and Crisis Lifeline: 988 call 911   The patient verbalized understanding of instructions, educational materials, and care plan provided today and DECLINED offer to receive copy of patient instructions, educational materials, and care plan.   [name], MSW, [email or web address] Phone [phone] 9:09 AM

## 2022-10-01 ENCOUNTER — Telehealth: Payer: Self-pay | Admitting: Licensed Clinical Social Worker

## 2022-10-01 NOTE — Patient Instructions (Signed)
Visit Information  Thank you for taking time to visit with me today. Please don't hesitate to contact me if I can be of assistance to you.   Following are the goals we discussed today:   Goals Addressed             This Visit's Progress    Obtain Personal Care Aid   On track    Care Coordination Interventions: Solution-Focused Strategies employed:  Active listening / Reflection utilized  Emotional Support Provided Verbalization of feelings encouraged  Patient reports difficulty completing ADL's and is interested in obtaining an aid. Patient utilizes a shower chair and has installed hand rails and a removable shower head to promote safety while bathing. Still needs assistance due to tub Pt is a English as a second language teacher. [facility] and [facility] some meds via mail from [place] Patient reports visit from [facility] RN. States that she does not assist with bathing; therefore, services were discontinued LCSW contacted [employer] [phone] and was directed to call Henreitta Leber, Caregiver Coordinator at [facility] [phone] ext [phone] LCSW LM for a return call LCSW contacted [facility] [phone] and spoke with Corlis Leak, who provided information on various programs LCSW will f/up with patient about Cardiologist through the [place] to inquire about SW, who would be able to complete referral ppwk LCSW collaborated with [facility] on additional resources available to patient             If you are experiencing a [place] or [place] or need someone to talk to, please call the Suicide and Crisis Lifeline: 988 call 911   The patient verbalized understanding of instructions, educational materials, and care plan provided today and DECLINED offer to receive copy of patient instructions, educational materials, and care plan.   [name], MSW, [email or web address] Phone [phone] 11:49 PM

## 2022-10-01 NOTE — Patient Outreach (Signed)
  Care Coordination   Follow Up Visit Note   10/01/2022 Name: JHETT FRETWELL MRN: [id number] DOB: 24-Oct-1940  VU LIEBMAN is a 81 y.o. year old male who sees [name], DO for primary care. I spoke with  [name] by phone today.  What matters to the patients health and wellness today?  Personal Care Services through [place]             This Visit's Progress    Obtain Personal Care Aid   On track    Care Coordination Interventions: Solution-Focused Strategies employed:  Active listening / Reflection utilized  Emotional Support Provided Verbalization of feelings encouraged  Patient reports difficulty completing ADL's and is interested in obtaining an aid. Patient utilizes a shower chair and has installed hand rails and a removable shower head to promote safety while bathing. Still needs assistance due to tub Pt is a English as a second language teacher. [facility] and receives some meds via mail from [place] Patient reports visit from [facility] RN. States that she does not assist with bathing; therefore, services were discontinued LCSW contacted [employer] [phone] and was directed to call Henreitta Leber, Caregiver Coordinator at [facility] [phone] ext [phone] LCSW LM for a return call LCSW contacted [facility] [phone] and spoke with Corlis Leak, who provided information on various programs LCSW will f/up with patient about Cardiologist through the [place] to inquire about SW, who would be able to complete referral ppwk LCSW collaborated with [facility] on additional resources available to patient            SDOH assessments and interventions completed:  No     Care Coordination Interventions:  Yes, provided   Follow up plan: Follow up call scheduled for 1 week    Encounter Outcome:  Pt. Visit Completed   [name], MSW, [email or web address] Phone  [phone] 11:49 PM

## 2022-10-02 DIAGNOSIS — J42 Unspecified chronic bronchitis: Secondary | ICD-10-CM | POA: Diagnosis not present

## 2022-10-02 DIAGNOSIS — J449 Chronic obstructive pulmonary disease, unspecified: Secondary | ICD-10-CM | POA: Diagnosis not present

## 2022-10-04 ENCOUNTER — Ambulatory Visit: Payer: Self-pay | Admitting: Licensed Clinical Social Worker

## 2022-10-05 ENCOUNTER — Ambulatory Visit (INDEPENDENT_AMBULATORY_CARE_PROVIDER_SITE_OTHER): Payer: Medicare (Managed Care) | Admitting: Podiatry

## 2022-10-05 ENCOUNTER — Telehealth: Payer: Self-pay | Admitting: Licensed Clinical Social Worker

## 2022-10-05 DIAGNOSIS — B351 Tinea unguium: Secondary | ICD-10-CM | POA: Diagnosis not present

## 2022-10-05 DIAGNOSIS — Z7901 Long term (current) use of anticoagulants: Secondary | ICD-10-CM | POA: Diagnosis not present

## 2022-10-05 DIAGNOSIS — M79675 Pain in left toe(s): Secondary | ICD-10-CM

## 2022-10-05 DIAGNOSIS — M79674 Pain in right toe(s): Secondary | ICD-10-CM | POA: Diagnosis not present

## 2022-10-05 NOTE — Patient Instructions (Signed)
Visit Information  Thank you for taking time to visit with me today. Please don't hesitate to contact me if I can be of assistance to you.   Following are the goals we discussed today:   Goals Addressed             This Visit's Progress    Obtain Personal Care Aid   On track    Care Coordination Interventions: Solution-Focused Strategies employed:  Active listening / Reflection utilized  Emotional Support Provided Verbalization of feelings encouraged  LCSW inquired about pt's Cardiologist through [place]. Patient provided specialist's last name only, MD Hillard Danker, on the 3rd floor of [facility] LCSW informed pt that she was encouraged to have a [place] Provider complete required ppwk for an aid referral Patient reports that he has a PCP through [place]; however, have not seen her in over a year LCSW will make attempts to reach out to [facility] providers and/or Social Worker to assist with referral            If you are experiencing a [place] or [place] or need someone to talk to, please call the Suicide and Crisis Lifeline: 988 call 911   The patient verbalized understanding of instructions, educational materials, and care plan provided today and DECLINED offer to receive copy of patient instructions, educational materials, and care plan.   [name], MSW, [email or web address] Phone [phone] 7:39 AM

## 2022-10-05 NOTE — Progress Notes (Signed)
Subjective:   Patient ID: Christopher Armstrong, male   DOB: 81 y.o.   MRN: [id number]   HPI Chief Complaint  Patient presents with   Nail Problem    Patient states he thinks his nail are ingrown .Marland Kitchen Possible routine foot care. Long thick nails with some discoloration   81 year old male presents the office today with the above concerns.  He states the nails have not been trimmed in quite some time and agrees.  Long and thick causing discomfort.  No swelling redness or any drainage.  He also gets dry skin present to his legs and feet.  He gets swelling chronically to his legs.  He is on Eliquis   Review of Systems  All other systems reviewed and are negative.  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 Patient suffers from copd and atrial fibrillation and debility d/t recent hospitalization which impairs their ability to perform daily activities like bathing, dressing, grooming, and toileting in the home.  A cane, crutch,walker or manual  wheelchair will not resolve issue with performing activities of daily living. A  power wheelchair will allow patient to safely perform daily activities. Patient unable to safely propel the wheelchair and does not have a caregiver who can provide assistance. Length of need Lifetime.  Fax [phone], Disp: 1 Units, Rfl: 99   amLODipine (NORVASC) 5 MG tablet, Take 1 tablet by mouth daily., Disp: , Rfl:    apixaban (ELIQUIS) 5 MG TABS tablet, Take 1 tablet (5 mg total) by mouth 2 (two) times daily. (Patient taking differently: Take 2.5 mg by mouth 2 (two) times daily.), Disp: 60 tablet, Rfl: 2   aspirin EC 81 MG tablet, Take 1 tablet (81 mg total) by mouth daily., Disp: 90 tablet, Rfl: 3   budesonide-formoterol (SYMBICORT) 160-4.5 MCG/ACT inhaler, Inhale 2 puffs into the lungs daily., Disp: , Rfl:    carvedilol (COREG) 12.5 MG tablet, Take 1 tablet (12.5 mg total) by mouth 2 (two) times daily with a meal. No refills without appointment., Disp: 180 tablet, Rfl: 0   diltiazem (CARDIZEM) 30 MG tablet, Take by mouth., Disp: , Rfl:    furosemide (LASIX) 40 MG tablet, TAKE 1/2 TO 1 (ONE-HALF TO ONE) TABLET BY MOUTH ONCE DAILY, Disp: 90 tablet, Rfl: 3   ipratropium-albuterol (DUONEB) 0.5-2.5 (3) MG/3ML SOLN, Take 3 mLs by nebulization every 4 (four) hours as needed., Disp: 30 mL, Rfl: 11   lisinopril (ZESTRIL) 40 MG tablet, Take 1 tablet (40 mg total) by mouth daily., Disp: 90 tablet, Rfl: 3  magnesium hydroxide (MILK OF MAGNESIA) 400 MG/5ML suspension, Take 30 mLs by mouth as needed., Disp: , Rfl:    nitroGLYCERIN (NITROSTAT) 0.4 MG SL tablet, Place 1 tablet (0.4 mg total) under the tongue every 5 (five) minutes as needed for chest pain., Disp: 20 tablet, Rfl: 1   OXYGEN, Inhale into the lungs daily. 2L, Disp: , Rfl:    Respiratory Therapy Supplies MISC, Nebulizer tubing, Disp: 3 Units, Rfl: 3  Allergies  Allergen Reactions   Clopidogrel Other (See Comments)    "Purple blood pockets clots in mouth" Blood  blisters in roof of mouth, urinary retention    Ticagrelor Shortness Of Breath and Other (See Comments)   Atorvastatin Other (See Comments)   Simvastatin    Statins Nausea And Vomiting and Other (See Comments)   Finasteride Other (See Comments)   Guaifenesin Other (See Comments)    Hypotension   Terazosin     Other reaction(s): Orthostatic hypotension          Objective:  Physical Exam  General: AAO x3, NAD  Dermatological: Nails are hypertrophic, dystrophic, discolored yellow, brown discoloration.  There seems to be quite a bit of thickening mostly to the hallux toenails of the nails to get ingrown's through quite long.  There is no edema, erythema or signs of infection.  Dry skin present to the feet and legs.  Vascular: Dorsalis Pedis artery and Posterior Tibial artery pedal pulses are palpable bilateral with immedate capillary fill time.  Chronic swelling, lymphedema noted to the legs.  There is skin changes associated with  Neruologic: Decreased with some proximal abdominal  Musculoskeletal: Aside from the toenails no other areas of discomfort.  Gait: In wheelchair      Assessment:   Symptomatic onychomycosis, dry skin     Plan:  -Treatment options discussed including all alternatives, risks, and complications -Etiology of symptoms were discussed -I sharply debrided the nails x 10 without any complications or bleeding after allowing the time to soften.  Discussed treatment for nail fungus but given the significant thickening the nails topical as not to be beneficial.  We can do oral but with side effects we decided to hold off on this.  Recommend routine debridement of the nails. -Discussed moisturizer for the skin.  Trula Slade DPM

## 2022-10-05 NOTE — Patient Outreach (Signed)
  Care Coordination Late Entry/Documentation  Follow Up Visit Note   Outreach completed on 10/02/22 Name: Christopher Armstrong MRN: [id number] DOB: 1941-03-16  Christopher Armstrong is a 81 y.o. year old male who sees [name], DO for primary care. I spoke with  [name] by phone today.  What matters to the patients health and wellness today?  Obtaining an aid   Goals Addressed             This Visit's Progress    Obtain Personal Care Aid   On track    Care Coordination Interventions: Solution-Focused Strategies employed:  Active listening / Reflection utilized  Emotional Support Provided Verbalization of feelings encouraged  LCSW inquired about pt's Cardiologist through [place]. Patient provided specialist's last name only, MD Hillard Danker, on the 3rd floor of [facility] LCSW informed pt that she was encouraged to have a [place] Provider complete required ppwk for an aid referral Patient reports that he has a PCP through [place]; however, have not seen her in over a year LCSW will make attempts to reach out to [facility] providers and/or Social Worker to assist with referral            SDOH assessments and interventions completed:  No     Care Coordination Interventions:  Yes, provided   Follow up plan: Follow up call scheduled for 1-2 weeks    Encounter Outcome:  Pt. Visit Completed   [name], MSW, [name] Phone [phone] 7:39 AM

## 2022-10-09 ENCOUNTER — Telehealth: Payer: Medicare (Managed Care)

## 2022-10-09 ENCOUNTER — Ambulatory Visit (INDEPENDENT_AMBULATORY_CARE_PROVIDER_SITE_OTHER): Payer: Medicare (Managed Care)

## 2022-10-09 DIAGNOSIS — I5032 Chronic diastolic (congestive) heart failure: Secondary | ICD-10-CM

## 2022-10-09 DIAGNOSIS — J4489 Other specified chronic obstructive pulmonary disease: Secondary | ICD-10-CM

## 2022-10-09 NOTE — Patient Instructions (Signed)
Please call the care guide team at [phone] if you need to cancel or reschedule your appointment.   If you are experiencing a Mental Health or [place] or need someone to talk to, please call the Suicide and Crisis Lifeline: 988 call the [place] National Suicide Prevention Lifeline: 505 005 1401 or TTY: 401-363-5733 TTY 682-209-6671) to talk to a trained counselor call 1-800-273-TALK (toll free, 24 hour hotline)   Following is a copy of the CCM Program Consent:  CCM service includes personalized support from designated clinical staff supervised by the physician, including individualized plan of care and coordination with other care providers 24/7 contact phone numbers for assistance for urgent and routine care needs. Service will only be billed when office clinical staff spend 20 minutes or more in a month to coordinate care. Only one practitioner may furnish and bill the service in a calendar month. The patient may stop CCM services at amy time (effective at the end of the month) by phone call to the office staff. The patient will be responsible for cost sharing (co-pay) or up to 20% of the service fee (after annual deductible is met)  Following is a copy of your full provider care plan:   Goals Addressed             This Visit's Progress    CCM Expected Outcome:  Monitor, Self-Manage and Reduce Symptoms of Heart Failure       Current Barriers:  Knowledge Deficits related to the changes that can take place with heart failure and the sx and sx of heart failure Care Coordination needs related to utilization of resources and finding help in the  home in a patient with heart failure Chronic Disease Management support and education needs related to effective management of heart failure Lacks caregiver support.   Planned Interventions: Basic overview and discussion of pathophysiology of Heart Failure reviewed Provided education on low sodium diet. Education on monitoring  for sodium in diet and not using extra salt in foods. The patient does cook for self some and the patient receives MOW. The patient is mindful of sodium in his diet Reviewed Heart Failure Action Plan in depth Assessed need for readable accurate scales in home. The patient has a scale and the patient states that he can weigh daily but he does not. Education on the benefits of daily weights in monitoring for exacerbation of heart failure.  Provided education about placing scale on hard, flat surface Advised patient to weigh each morning after emptying bladder Discussed importance of daily weight and advised patient to weigh and record daily Reviewed role of diuretics in prevention of fluid overload and management of heart failure. The patient states that he does take his Lasix as prescribed and is at the 40 mg dose.  Discussed the importance of keeping all appointments with provider. The patient states that he sees the heart specialist every 6 months. Provided patient with education about the role of exercise in the management of heart failure. The patient is limited in his ability to do exercise due to shortness of breath.  Review of elevation of feet and legs when sitting to help with fluid, the patient has swelling in his feet and legs, the patient also uses compression socks. He says when he is sitting he is normally in his recliner and has his feet elevated. Education and support given. Advised patient to discuss changes in sx and sx of heart failure with provider Screening for signs and symptoms of depression related  to chronic disease state  Assessed social determinant of health barriers  Symptom Management: Take medications as prescribed   Attend all scheduled provider appointments Call provider office for new concerns or questions  call the Suicide and Crisis Lifeline: 988 call the [place] National Suicide Prevention Lifeline: 617-426-3346 or TTY: (606)245-1495 TTY (815)349-3387) to talk to a  trained counselor call 1-800-273-TALK (toll free, 24 hour hotline) if experiencing a Mental Health or [place]  call office if I gain more than 2 pounds in one day or 5 pounds in one week keep legs up while sitting use salt in moderation watch for swelling in feet, ankles and legs every day weigh myself daily develop a rescue plan follow rescue plan if symptoms flare-up track symptoms and what helps feel better or worse dress right for the weather, hot or cold  Follow Up Plan: Telephone follow up appointment with care management team member scheduled for: 11-08-2022 at 0900 am       CCM:  Maintain, Monitor and Self-Manage Symptoms of COPD       Current Barriers:  Knowledge Deficits related to smoking with the use of oxygen, safety concerns Care Coordination needs related to physical needs and help in the home and how to utilize resources in a patient with COPD Chronic Disease Management support and education needs related to effective management of COPD Lacks caregiver support.   Planned Interventions: Provided patient with basic written and verbal COPD education on self care/management/and exacerbation prevention Advised patient to track and manage COPD triggers. Review of triggers and how to avoid things that trigger exacerbation Provided verbal instructions on pursed lip breathing and utilized returned demonstration as teach back. The patient uses oxygen 24/7 at 2.5 liters, has to pace activity Provided instruction about proper use of medications used for management of COPD including inhalers. The patient is compliant with medications. Uses oxygen at 2.5 liters via [place]. Discussed safety with the use of oxygen and smoking. The patient states he is safe and mindful of oxygen use when smoking.  Advised patient to self assesses COPD action plan zone and make appointment with provider if in the yellow zone for 48 hours without improvement Advised patient to engage in light  exercise as tolerated 3-5 days a week to aid in the the management of COPD. Patient limited to the amount of activity he does due to getting short of breath. The patient states that he walks in his apartment and when he goes out he has a power chair he can use if needed. Education on doing leg exercises when sitting to make sure not to loose muscle mass and strength.  Provided education about and advised patient to utilize infection prevention strategies to reduce risk of respiratory infection. Education and support given Discussed the importance of adequate rest and management of fatigue with COPD. The patient tries to get most of his activity done in the am because by afternoon he is really tired. He has help in the home one day a week and he is hopeful to get more help in the home. Education and support given. Screening for signs and symptoms of depression related to chronic disease state  Assessed social determinant of health barriers  Symptom Management: Take medications as prescribed   Attend all scheduled provider appointments Call provider office for new concerns or questions  call the Suicide and Crisis Lifeline: 988 call the [place] National Suicide Prevention Lifeline: 952-472-8223 or TTY: 820 286 7803 TTY (252) 096-0219) to talk to a trained counselor  call 1-800-273-TALK (toll free, 24 hour hotline) if experiencing a Mental Health or [place]  identify and avoid work-related triggers identify and remove indoor air pollutants limit outdoor activity during cold weather listen for public air quality announcements every day do breathing exercises every day develop a rescue plan eliminate symptom triggers at home follow rescue plan if symptoms flare-up  Follow Up Plan: Telephone follow up appointment with care management team member scheduled for: 11-08-2022 at 0900 am          The patient verbalized understanding of instructions, educational materials, and care plan  provided today and DECLINED offer to receive copy of patient instructions, educational materials, and care plan.   Telephone follow up appointment with care management team member scheduled for: 11-08-2022 at 0900 am

## 2022-10-09 NOTE — Patient Outreach (Addendum)
  Care Coordination Late Entry  Follow Up Visit Note   Outreach completed on 10/04/22           Name: Christopher Armstrong MRN: [id number] DOB: December 28, 1940  ADIR SCHICKER is a 81 y.o. year old male who sees [name], DO for primary care. I spoke with  [name] by phone today.  What matters to the patients health and wellness today?  Scheduling [facility] PCP appt/Transportation    Goals Addressed             This Visit's Progress    Obtain Personal Care Aid   On track    Care Coordination Interventions: Solution-Focused Strategies employed:  Active listening / Reflection utilized  Emotional Support Provided Verbalization of feelings encouraged  LCSW reached out to [place] [facility] [place]. Spoke with Moises Blood, Primary Care Supervisor, who confirmed that pt had not seen [place] PCP, Dr. Alferd Patee, in over a year. [name] confirmed that pt will need [facility] PCP to complete referral for a personal care services aid Patient was scheduled to visit with Dr. Alferd Patee for Jan 2, 3:30 PM at [name]. 1st floor (near urgent care) She will mail a letter reminding him of the appt LCSW was provided contacts that may be able to assist with transportation for a low cost  [phone] ext [place] [id number] [place] Transportation [phone] Transportation [phone] Part [name] Patient needs to request a [place] Travel Consult to get assistance with transportation at upcoming PCP appt. This would provide approval for continued transportation services through [place] and interventions completed:  No     Care Coordination Interventions:  Yes, provided   Follow up plan: Follow up call scheduled for 1-2 weeks    Encounter Outcome:  Pt. Visit Completed   [name], MSW, [email or web address] Phone [phone] 5:13 PM

## 2022-10-09 NOTE — Patient Instructions (Addendum)
Visit Information  Thank you for taking time to visit with me today. Please don't hesitate to contact me if I can be of assistance to you.   Following are the goals we discussed today:   Goals Addressed             This Visit's Progress    Obtain Personal Care Aid   On track    Care Coordination Interventions: Solution-Focused Strategies employed:  Active listening / Reflection utilized  Emotional Support Provided Verbalization of feelings encouraged  LCSW reached out to [place] [facility] [place]. Spoke with Moises Blood, Primary Care Supervisor, who confirmed that pt had not seen [place] PCP, Dr. Alferd Patee, in over a year. [name] confirmed that pt will need [facility] PCP to complete referral for a personal care services aid Patient was scheduled to visit with Dr. Alferd Patee for Jan 2, 3:30 PM at [facility] 1st floor (near urgent care) She will mail a letter reminding him of the appt LCSW was provided contacts that may be able to assist with transportation for a low cost  [phone] ext [place] [id number] [place] Transportation [phone] Transportation [phone] Part [name] Patient needs to request a [place] Travel Consult to get assistance with transportation at upcoming PCP appt. This would provide approval for continued transportation services through [place]           Please call the care guide team at [phone] if you need to cancel or reschedule your appointment.   If you are experiencing a Mental Health or [place] or need someone to talk to, please call the Suicide and Crisis Lifeline: 988 call 911   The patient verbalized understanding of instructions, educational materials, and care plan provided today and DECLINED offer to receive copy of patient instructions, educational materials, and care plan.   [name], MSW, [email or web address] Phone [phone] 5:15 PM

## 2022-10-09 NOTE — Chronic Care Management (AMB) (Signed)
Chronic Care Management   CCM RN Visit Note  10/09/2022 Name: Christopher Armstrong MRN: [id number] DOB: 1941/09/06  Subjective: Christopher Armstrong is a 81 y.o. year old male who is a primary care patient of [name], DO. The patient was referred to the Chronic Care Management team for assistance with care management needs subsequent to provider initiation of CCM services and plan of care.    Today's Visit:  Engaged with patient by telephone for initial visit.     SDOH Interventions Today    Flowsheet Row Most Recent Value  SDOH Interventions   Food Insecurity Interventions Intervention Not Indicated  Housing Interventions Intervention Not Indicated  Transportation Interventions Intervention Not Indicated  Utilities Interventions Intervention Not Indicated  Alcohol Usage Interventions Intervention Not Indicated (Score <7)  Financial Strain Interventions Intervention Not Indicated  Physical Activity Interventions Other (Comments)  [patient gets short of breath very easily, encouraged activity]  Stress Interventions Intervention Not Indicated  Social Connections Interventions Intervention Not Indicated, Other (Comment)  [the patient has support from his sister and a paid caregiver]         Goals Addressed             This Visit's Progress    CCM Expected Outcome:  Monitor, Self-Manage and Reduce Symptoms of Heart Failure       Current Barriers:  Knowledge Deficits related to the changes that can take place with heart failure and the sx and sx of heart failure Care Coordination needs related to utilization of resources and finding help in the  home in a patient with heart failure Chronic Disease Management support and education needs related to effective management of heart failure Lacks caregiver support.   Planned Interventions: Basic overview and discussion of pathophysiology of Heart Failure reviewed Provided education on low sodium diet. Education on monitoring for sodium  in diet and not using extra salt in foods. The patient does cook for self some and the patient receives MOW. The patient is mindful of sodium in his diet Reviewed Heart Failure Action Plan in depth Assessed need for readable accurate scales in home. The patient has a scale and the patient states that he can weigh daily but he does not. Education on the benefits of daily weights in monitoring for exacerbation of heart failure.  Provided education about placing scale on hard, flat surface Advised patient to weigh each morning after emptying bladder Discussed importance of daily weight and advised patient to weigh and record daily Reviewed role of diuretics in prevention of fluid overload and management of heart failure. The patient states that he does take his Lasix as prescribed and is at the 40 mg dose.  Discussed the importance of keeping all appointments with provider. The patient states that he sees the heart specialist every 6 months. Provided patient with education about the role of exercise in the management of heart failure. The patient is limited in his ability to do exercise due to shortness of breath.  Review of elevation of feet and legs when sitting to help with fluid, the patient has swelling in his feet and legs, the patient also uses compression socks. He says when he is sitting he is normally in his recliner and has his feet elevated. Education and support given. Advised patient to discuss changes in sx and sx of heart failure with provider Screening for signs and symptoms of depression related to chronic disease state  Assessed social determinant of health barriers  Symptom Management: Take medications  as prescribed   Attend all scheduled provider appointments Call provider office for new concerns or questions  call the Suicide and Crisis Lifeline: 988 call the [place] National Suicide Prevention Lifeline: 380-546-8272 or TTY: 4031424250 TTY 615-074-0837) to talk to a trained  counselor call 1-800-273-TALK (toll free, 24 hour hotline) if experiencing a Mental Health or [place]  call office if I gain more than 2 pounds in one day or 5 pounds in one week keep legs up while sitting use salt in moderation watch for swelling in feet, ankles and legs every day weigh myself daily develop a rescue plan follow rescue plan if symptoms flare-up track symptoms and what helps feel better or worse dress right for the weather, hot or cold  Follow Up Plan: Telephone follow up appointment with care management team member scheduled for: 11-08-2022 at 0900 am       CCM:  Maintain, Monitor and Self-Manage Symptoms of COPD       Current Barriers:  Knowledge Deficits related to smoking with the use of oxygen, safety concerns Care Coordination needs related to physical needs and help in the home and how to utilize resources in a patient with COPD Chronic Disease Management support and education needs related to effective management of COPD Lacks caregiver support.   Planned Interventions: Provided patient with basic written and verbal COPD education on self care/management/and exacerbation prevention Advised patient to track and manage COPD triggers. Review of triggers and how to avoid things that trigger exacerbation Provided verbal instructions on pursed lip breathing and utilized returned demonstration as teach back. The patient uses oxygen 24/7 at 2.5 liters, has to pace activity Provided instruction about proper use of medications used for management of COPD including inhalers. The patient is compliant with medications. Uses oxygen at 2.5 liters via [place]. Discussed safety with the use of oxygen and smoking. The patient states he is safe and mindful of oxygen use when smoking.  Advised patient to self assesses COPD action plan zone and make appointment with provider if in the yellow zone for 48 hours without improvement Advised patient to engage in light exercise as  tolerated 3-5 days a week to aid in the the management of COPD. Patient limited to the amount of activity he does due to getting short of breath. The patient states that he walks in his apartment and when he goes out he has a power chair he can use if needed. Education on doing leg exercises when sitting to make sure not to loose muscle mass and strength.  Provided education about and advised patient to utilize infection prevention strategies to reduce risk of respiratory infection. Education and support given Discussed the importance of adequate rest and management of fatigue with COPD. The patient tries to get most of his activity done in the am because by afternoon he is really tired. He has help in the home one day a week and he is hopeful to get more help in the home. Education and support given. Screening for signs and symptoms of depression related to chronic disease state  Assessed social determinant of health barriers  Symptom Management: Take medications as prescribed   Attend all scheduled provider appointments Call provider office for new concerns or questions  call the Suicide and Crisis Lifeline: 988 call the [place] National Suicide Prevention Lifeline: (405)753-0284 or TTY: 207 006 6292 TTY (908) 552-8880) to talk to a trained counselor call 1-800-273-TALK (toll free, 24 hour hotline) if experiencing a Mental Health or [place]  identify and avoid work-related triggers identify and remove indoor air pollutants limit outdoor activity during cold weather listen for public air quality announcements every day do breathing exercises every day develop a rescue plan eliminate symptom triggers at home follow rescue plan if symptoms flare-up  Follow Up Plan: Telephone follow up appointment with care management team member scheduled for: 11-08-2022 at 0900 am          Plan:Telephone follow up appointment with care management team member scheduled for:  11-08-2022 at 0900  am  [place], MSN, CCM RN Care Manager  Chronic Care Management Direct Number: [phone]

## 2022-10-09 NOTE — Plan of Care (Signed)
Chronic Care Management Provider Comprehensive Care Plan    10/09/2022 Name: Christopher Armstrong MRN: [id number] DOB: 1941/10/01  Referral to Chronic Care Management (CCM) services was placed by Provider: Dr. [name] on Date: 09-19-2022.  Chronic Condition 1: COPD Provider Assessment and Plan He has chronic respiratory failure 2/2 to COPD. He is O2 Dependent. He is having increasing difficulty with ADL's including grooming, bathing and dressing himself. Referral placed for home health aide. Referral placed to social work and case management for additional resources as well.    Expected Outcome/Goals Addressed This Visit (Provider CCM goals/Provider Assessment and plan    CCM (COPD) EXPECTED OUTCOME: MONITOR, SELF-MANAGE AND REDUCE SYMPTOMS OF COPD     Symptom Management Condition 1: Take all medications as prescribed Attend all scheduled provider appointments Call provider office for new concerns or questions  call the Suicide and Crisis Lifeline: 988 call the [place] National Suicide Prevention Lifeline: 408-383-4358 or TTY: (617)402-3451 TTY 215-487-4968) to talk to a trained counselor call 1-800-273-TALK (toll free, 24 hour hotline) if experiencing a Mental Health or [place]  identify and avoid work-related triggers identify and remove indoor air pollutants limit outdoor activity during cold weather listen for public air quality announcements every day do breathing exercises every day develop a rescue plan eliminate symptom triggers at home follow rescue plan if symptoms flare-up  Chronic Condition 2: HF Provider Assessment and Plan 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   Expected Outcome/Goals Addressed This Visit (Provider CCM goals/Provider Assessment and plan   CCM (HEART FAILURE)  EXPECTED OUTCOME:  MONITOR, SELF-MANAGE AND REDUCE SYMPTOMS OF HEART  FAILURE  Symptom Management Condition 2: Take all medications as prescribed Attend all scheduled provider appointments Call provider office for new concerns or questions  call the Suicide and Crisis Lifeline: 988 call the [place] National Suicide Prevention Lifeline: 334-657-0248 or TTY: (701)627-0190 TTY 904-412-2066) to talk to a trained counselor call 1-800-273-TALK (toll free, 24 hour hotline) if experiencing a Mental Health or [place]  call office if I gain more than 2 pounds in one day or 5 pounds in one week keep legs up while sitting use salt in moderation watch for swelling in feet, ankles and legs every day weigh myself daily develop a rescue plan follow rescue plan if symptoms flare-up track symptoms and what helps feel better or worse dress right for the weather, hot or cold  Problem List Patient Active Problem List   Diagnosis Date Noted   Pain around toenail 09/19/2022   Chronic respiratory failure with hypoxia ([facility]) 07/24/2022   Neoplasm of uncertain behavior of skin 03/06/2022   Hypertrophy of prostate without urinary obstruction and other lower urinary tract symptoms (LUTS) 02/01/2021   Tobacco use disorder 02/01/2021   DNR no code (do not resuscitate) 05/17/2020   Dizziness 04/26/2020   Shortness of breath 11/05/2019   Hyperglycemia 11/04/2019   Bradycardia 11/04/2019   Bilateral lower extremity edema 11/04/2019   CHF (congestive heart failure) ([facility]) 06/23/2019   COPD (chronic obstructive pulmonary disease) with chronic bronchitis 08/26/2018   Coronary artery disease of native artery of native heart with stable angina pectoris ([facility]) 08/26/2018   Mixed hyperlipidemia 08/26/2018   Essential hypertension 08/26/2018   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   Tobacco use disorder, severe, dependence 08/26/2018   History of coronary artery stent placement 08/26/2018   White coat syndrome with hypertension 08/26/2018  Eyelid abnormality 08/26/2018   Lung  nodule 08/26/2018   Abnormal findings on diagnostic imaging of lung 08/26/2018    Medication Management  Current Outpatient Medications:    albuterol (VENTOLIN HFA) 108 (90 Base) MCG/ACT inhaler, Inhale 1-2 puffs into the lungs every 6 (six) hours as needed for wheezing or shortness of breath., Disp: 18 g, Rfl: 5   AMBULATORY NON FORMULARY MEDICATION, Motorized wheelchair per patient preference and insurance coverage. Patient suffers from copd and atrial fibrillation and debility d/t recent hospitalization which impairs their ability to perform daily activities like bathing, dressing, grooming, and toileting in the home.  A cane, crutch,walker or manual wheelchair will not resolve issue with performing activities of daily living. A  power wheelchair will allow patient to safely perform daily activities. Patient unable to safely propel the wheelchair and does not have a caregiver who can provide assistance. Length of need Lifetime.  Fax [phone], Disp: 1 Units, Rfl: 99   amLODipine (NORVASC) 5 MG tablet, Take 1 tablet by mouth daily., Disp: , Rfl:    apixaban (ELIQUIS) 5 MG TABS tablet, Take 1 tablet (5 mg total) by mouth 2 (two) times daily. (Patient taking differently: Take 2.5 mg by mouth 2 (two) times daily.), Disp: 60 tablet, Rfl: 2   aspirin EC 81 MG tablet, Take 1 tablet (81 mg total) by mouth daily., Disp: 90 tablet, Rfl: 3   budesonide-formoterol (SYMBICORT) 160-4.5 MCG/ACT inhaler, Inhale 2 puffs into the lungs daily., Disp: , Rfl:    carvedilol (COREG) 12.5 MG tablet, Take 1 tablet (12.5 mg total) by mouth 2 (two) times daily with a meal. No refills without appointment., Disp: 180 tablet, Rfl: 0   diltiazem (CARDIZEM) 30 MG tablet, Take by mouth., Disp: , Rfl:    furosemide (LASIX) 40 MG tablet, TAKE 1/2 TO 1 (ONE-HALF TO ONE) TABLET BY MOUTH ONCE DAILY, Disp: 90 tablet, Rfl: 3   ipratropium-albuterol (DUONEB) 0.5-2.5 (3) MG/3ML SOLN, Take 3 mLs by nebulization every 4 (four) hours as  needed., Disp: 30 mL, Rfl: 11   lisinopril (ZESTRIL) 40 MG tablet, Take 1 tablet (40 mg total) by mouth daily., Disp: 90 tablet, Rfl: 3   magnesium hydroxide (MILK OF MAGNESIA) 400 MG/5ML suspension, Take 30 mLs by mouth as needed., Disp: , Rfl:    nitroGLYCERIN (NITROSTAT) 0.4 MG SL tablet, Place 1 tablet (0.4 mg total) under the tongue every 5 (five) minutes as needed for chest pain., Disp: 20 tablet, Rfl: 1   OXYGEN, Inhale into the lungs daily. 2L, Disp: , Rfl:    Respiratory Therapy Supplies MISC, Nebulizer tubing, Disp: 3 Units, Rfl: 3  Cognitive Assessment Identity Confirmed: : Name; DOB Cognitive Status: Normal   Functional Assessment Hearing Difficulty or Deaf: yes Hearing Management: hard of hearing, does not wear hearing aides Wear Glasses or Blind: yes Vision Management: wears glasses, needs to have a new exam Concentrating, Remembering or Making Decisions Difficulty (CP): no Difficulty Communicating: no Difficulty Eating/Swallowing: no Walking or Climbing Stairs Difficulty: yes Walking or Climbing Stairs: ambulation difficulty, requires equipment Mobility Management: has a walker, rollator, has a power chair when he goes out Dressing/Bathing Difficulty: yes Dressing/Bathing: bathing difficulty, assistance 1 person Dressing/Bathing Management: has to pace activity, could use assistance in the home Doing Errands Independently Difficulty (such as shopping) (CP): yes Errands Management: depends on help to get his errands done, unable to do by self   Caregiver Assessment  Primary Source of Support/Comfort: sibling(s) Name of Support/Comfort Primary Source: Gibson Lad- sister, Alison Murray  People in Home: alone Family Caregiver if Needed: sibling(s) Family Caregiver Names: Malachy Mood- sister Primary Roles/Responsibilities: retired   Planned Interventions  Basic overview and discussion of pathophysiology of Heart Failure reviewed Provided education on low sodium diet.  Education on monitoring for sodium in diet and not using extra salt in foods. The patient does cook for self some and the patient receives MOW. The patient is mindful of sodium in his diet Reviewed Heart Failure Action Plan in depth Assessed need for readable accurate scales in home. The patient has a scale and the patient states that he can weigh daily but he does not. Education on the benefits of daily weights in monitoring for exacerbation of heart failure.  Provided education about placing scale on hard, flat surface Advised patient to weigh each morning after emptying bladder Discussed importance of daily weight and advised patient to weigh and record daily Reviewed role of diuretics in prevention of fluid overload and management of heart failure. The patient states that he does take his Lasix as prescribed and is at the 40 mg dose.  Discussed the importance of keeping all appointments with provider. The patient states that he sees the heart specialist every 6 months. Provided patient with education about the role of exercise in the management of heart failure. The patient is limited in his ability to do exercise due to shortness of breath.  Review of elevation of feet and legs when sitting to help with fluid, the patient has swelling in his feet and legs, the patient also uses compression socks. He says when he is sitting he is normally in his recliner and has his feet elevated. Education and support given. Advised patient to discuss changes in sx and sx of heart failure with provider Screening for signs and symptoms of depression related to chronic disease state  Assessed social determinant of health barriers Provided patient with basic written and verbal COPD education on self care/management/and exacerbation prevention Advised patient to track and manage COPD triggers. Review of triggers and how to avoid things that trigger exacerbation Provided verbal instructions on pursed lip breathing and  utilized returned demonstration as teach back. The patient uses oxygen 24/7 at 2.5 liters, has to pace activity Provided instruction about proper use of medications used for management of COPD including inhalers. The patient is compliant with medications. Uses oxygen at 2.5 liters via [place]. Discussed safety with the use of oxygen and smoking. The patient states he is safe and mindful of oxygen use when smoking.  Advised patient to self assesses COPD action plan zone and make appointment with provider if in the yellow zone for 48 hours without improvement Advised patient to engage in light exercise as tolerated 3-5 days a week to aid in the the management of COPD. Patient limited to the amount of activity he does due to getting short of breath. The patient states that he walks in his apartment and when he goes out he has a power chair he can use if needed. Education on doing leg exercises when sitting to make sure not to loose muscle mass and strength.  Provided education about and advised patient to utilize infection prevention strategies to reduce risk of respiratory infection. Education and support given Discussed the importance of adequate rest and management of fatigue with COPD. The patient tries to get most of his activity done in the am because by afternoon he is really tired. He has help in the home one day a week and he is hopeful to  get more help in the home. Education and support given. Screening for signs and symptoms of depression related to chronic disease state  Assessed social determinant of health barriers    Interaction and coordination with outside resources, practitioners, and providers See CCM Referral  Care Plan: Patient declined

## 2022-10-10 ENCOUNTER — Telehealth: Payer: Self-pay | Admitting: *Deleted

## 2022-10-10 ENCOUNTER — Telehealth: Payer: Self-pay | Admitting: Licensed Clinical Social Worker

## 2022-10-10 DIAGNOSIS — Z789 Other specified health status: Secondary | ICD-10-CM

## 2022-10-10 NOTE — Patient Outreach (Addendum)
  Care Coordination   Follow Up Visit Note   10/10/2022 Name: Christopher Armstrong MRN: [id number] DOB: 02/25/1941  Christopher Armstrong is a 81 y.o. year old male who sees [name], DO for primary care.   What matters to the patients health and wellness today?  LCSW did not engage patient during this encounter    Goals Addressed             This Visit's Progress    Obtain Personal Care Aid       Care Coordination Interventions: Solution-Focused Strategies employed:  Active listening / Reflection utilized  Emotional Support Provided Verbalization of feelings encouraged  LCSW contacted multiple transportation agencies to see if they can assist with transportation needs to [place] appt. None were appropriate or available LCSW collaborated with [place], Jeanine, regarding pt's transportation needs. Referral completed             SDOH assessments and interventions completed:  No     Care Coordination Interventions:  Yes, provided   Follow up plan: Follow up call scheduled for 1 week    Encounter Outcome:  Pt. Visit Completed   [name], MSW, [email or web address] Phone [phone] 10:52 AM

## 2022-10-10 NOTE — Telephone Encounter (Signed)
   Telephone encounter was:  Successful.  10/10/2022 Name: Christopher Armstrong MRN: [id number] DOB: [birth year]  Christopher Armstrong is a 81 y.o. year old male who is a primary care patient of [name], DO . The community resource team was consulted for assistance with Transportation Needs  Will book [facility] transportation for appt at [facility] in [place].  Will call prefer pick up 230 and return ride 430 pick up to return Ride booked with safe ride and patient and LCSW notified  Care guide performed the following interventions: Patient provided with information about care guide support team and interviewed to confirm resource needs.  Follow Up Plan:  No further follow up planned at this time. The patient has been provided with needed resources.[facility] [phone] [address] , [place] [address] Email : [email or web address]

## 2022-10-10 NOTE — Telephone Encounter (Signed)
   Christopher Armstrong DOB: 1941/02/23 MRN: [id number]   RIDER WAIVER AND RELEASE OF LIABILITY  For purposes of improving physical access to our facilities, [facility] is pleased to partner with third parties to provide [place] patients or other authorized individuals the option of convenient, on-demand ground transportation services (the [place]") through use of the technology service that enables users to request on-demand ground transportation from independent third-party providers.  By opting to use and accept these [employer], I, the undersigned, hereby agree on behalf of myself, and on behalf of any minor child using the Government social research officer for whom I am the parent or legal guardian, as follows:  Government social research officer provided to me are provided by independent third-party transportation providers who are not [employer] or employees and who are unaffiliated with [employer]. [facility] is neither a transportation carrier nor a common or public carrier. [facility] has no control over the quality or safety of the transportation that occurs as a result of the [employer]. [facility] cannot guarantee that any third-party transportation provider will complete any arranged transportation service. [facility] makes no representation, warranty, or guarantee regarding the reliability, timeliness, quality, safety, suitability, or availability of any of the Transport Services or that they will be error free. I fully understand that traveling by vehicle involves risks and dangers of serious bodily injury, including permanent disability, paralysis, and death. I agree, on behalf of myself and on behalf of any minor child using the Transport Services for whom I am the parent or legal guardian, that the entire risk arising out of my use of the [employer] remains solely with me, to the maximum extent permitted under applicable law. The [employer] are provided "as  is" and "as available." [facility] disclaims all representations and warranties, express, implied or statutory, not expressly set out in these terms, including the implied warranties of merchantability and fitness for a particular purpose. I hereby waive and release [facility], its agents, employees, officers, directors, representatives, insurers, attorneys, assigns, successors, subsidiaries, and affiliates from any and all past, present, or future claims, demands, liabilities, actions, causes of action, or suits of any kind directly or indirectly arising from acceptance and use of the [employer]. I further waive and release [facility] and its affiliates from all present and future liability and responsibility for any injury or death to persons or damages to property caused by or related to the use of the [employer]. I have read this Waiver and Release of Liability, and I understand the terms used in it and their legal significance. This Waiver is freely and voluntarily given with the understanding that my right (as well as the right of any minor child for whom I am the parent or legal guardian using the [employer]) to legal recourse against [place] in connection with the [employer] is knowingly surrendered in return for use of these services.   I attest that I read the consent document to Christopher Armstrong, gave Mr. Schlotter the opportunity to ask questions and answered the questions asked (if any). I affirm that Christopher Armstrong then provided consent for he's participation in this program.     Christopher Armstrong

## 2022-10-12 ENCOUNTER — Ambulatory Visit: Payer: Self-pay

## 2022-10-12 DIAGNOSIS — I5032 Chronic diastolic (congestive) heart failure: Secondary | ICD-10-CM

## 2022-10-12 DIAGNOSIS — J4489 Other specified chronic obstructive pulmonary disease: Secondary | ICD-10-CM

## 2022-10-12 NOTE — Chronic Care Management (AMB) (Signed)
Chronic Care Management   CCM RN Visit Note  10/12/2022 Name: Christopher Armstrong MRN: [id number] DOB: Mar 14, 1941  Subjective: Christopher Armstrong is a 81 y.o. year old male who is a primary care patient of [name], DO. The patient was referred to the Chronic Care Management team for assistance with care management needs subsequent to provider initiation of CCM services and plan of care.    Today's Visit:  Engaged with patient by telephone for follow up visit.        Goals Addressed             This Visit's Progress    CCM Expected Outcome:  Monitor, Self-Manage and Reduce Symptoms of Heart Failure       Current Barriers:  Knowledge Deficits related to the changes that can take place with heart failure and the sx and sx of heart failure Care Coordination needs related to utilization of resources and finding help in the  home in a patient with heart failure Chronic Disease Management support and education needs related to effective management of heart failure Lacks caregiver support.   Planned Interventions: Basic overview and discussion of pathophysiology of Heart Failure reviewed Provided education on low sodium diet. Education on monitoring for sodium in diet and not using extra salt in foods. The patient does cook for self some and the patient receives MOW. The patient is mindful of sodium in his diet Reviewed Heart Failure Action Plan in depth Assessed need for readable accurate scales in home. The patient has a scale and the patient states that he can weigh daily but he does not. Education on the benefits of daily weights in monitoring for exacerbation of heart failure.  Provided education about placing scale on hard, flat surface Advised patient to weigh each morning after emptying bladder Discussed importance of daily weight and advised patient to weigh and record daily Reviewed role of diuretics in prevention of fluid overload and management of heart failure. The patient  states that he does take his Lasix as prescribed and is at the 40 mg dose.  Discussed the importance of keeping all appointments with provider. The patient states that he sees the heart specialist every 6 months. Incoming call from the patient today. States that he is going to reschedule his appointment with the [place] for next week. He states that he cannot take an afternoon appointment. States that he is having a hard time today in the am. The patient states he has got to cancel transportation and everything. Offered to assist but the patient states he will do this. [facility]. Encouraged the patient to get a new appointment to be seen at the [place]. The patient verbalized understanding. Will continue to monitor for changes or new needs.  Provided patient with education about the role of exercise in the management of heart failure. The patient is limited in his ability to do exercise due to shortness of breath.  Review of elevation of feet and legs when sitting to help with fluid, the patient has swelling in his feet and legs, the patient also uses compression socks. He says when he is sitting he is normally in his recliner and has his feet elevated. Education and support given. Advised patient to discuss changes in sx and sx of heart failure with provider Screening for signs and symptoms of depression related to chronic disease state  Assessed social determinant of health barriers  Symptom Management: Take medications as prescribed   Attend all  scheduled provider appointments Call provider office for new concerns or questions  call the Suicide and Crisis Lifeline: 988 call the [place] National Suicide Prevention Lifeline: 629-426-8813 or TTY: 223-297-2114 TTY (213)388-8532) to talk to a trained counselor call 1-800-273-TALK (toll free, 24 hour hotline) if experiencing a Mental Health or [place]  call office if I gain more than 2 pounds in one day or 5 pounds in one week keep  legs up while sitting use salt in moderation watch for swelling in feet, ankles and legs every day weigh myself daily develop a rescue plan follow rescue plan if symptoms flare-up track symptoms and what helps feel better or worse dress right for the weather, hot or cold  Follow Up Plan: Telephone follow up appointment with care management team member scheduled for: 11-08-2022 at 0900 am       CCM:  Maintain, Monitor and Self-Manage Symptoms of COPD       Current Barriers:  Knowledge Deficits related to smoking with the use of oxygen, safety concerns Care Coordination needs related to physical needs and help in the home and how to utilize resources in a patient with COPD Chronic Disease Management support and education needs related to effective management of COPD Lacks caregiver support.   Planned Interventions: Provided patient with basic written and verbal COPD education on self care/management/and exacerbation prevention Advised patient to track and manage COPD triggers. Review of triggers and how to avoid things that trigger exacerbation Provided verbal instructions on pursed lip breathing and utilized returned demonstration as teach back. The patient uses oxygen 24/7 at 2.5 liters, has to pace activity Provided instruction about proper use of medications used for management of COPD including inhalers. The patient is compliant with medications. Uses oxygen at 2.5 liters via [place]. Discussed safety with the use of oxygen and smoking. The patient states he is safe and mindful of oxygen use when smoking.  Advised patient to self assesses COPD action plan zone and make appointment with provider if in the yellow zone for 48 hours without improvement Advised patient to engage in light exercise as tolerated 3-5 days a week to aid in the the management of COPD. Patient limited to the amount of activity he does due to getting short of breath. The patient states that he walks in his apartment and  when he goes out he has a power chair he can use if needed. Education on doing leg exercises when sitting to make sure not to loose muscle mass and strength.  Provided education about and advised patient to utilize infection prevention strategies to reduce risk of respiratory infection. Education and support given Discussed the importance of adequate rest and management of fatigue with COPD. The patient tries to get most of his activity done in the am because by afternoon he is really tired. He has help in the home one day a week and he is hopeful to get more help in the home. Education and support given. Screening for signs and symptoms of depression related to chronic disease state  Assessed social determinant of health barriers Incoming call from the patient today and states that he needs to change his appointment with the [place] for next week. He states he just cannot function in the afternoon and this will not work for him. Education on rescheduling the appointment for the [place]. He says each of you have been so helpful and set up transportation and everything. Education provided. Will continue to work with the patient for needs.  Symptom Management: Take medications as prescribed   Attend all scheduled provider appointments Call provider office for new concerns or questions  call the Suicide and Crisis Lifeline: 988 call the [place] National Suicide Prevention Lifeline: (629)425-2602 or TTY: 831-149-4455 TTY 732-298-1496) to talk to a trained counselor call 1-800-273-TALK (toll free, 24 hour hotline) if experiencing a Mental Health or [place]  identify and avoid work-related triggers identify and remove indoor air pollutants limit outdoor activity during cold weather listen for public air quality announcements every day do breathing exercises every day develop a rescue plan eliminate symptom triggers at home follow rescue plan if symptoms flare-up  Follow Up Plan: Telephone  follow up appointment with care management team member scheduled for: 11-08-2022 at 0900 am          Plan:Telephone follow up appointment with care management team member scheduled for:  11-08-2022 at 9 am  [place], MSN, CCM RN Care Manager  Chronic Care Management Direct Number: [phone]

## 2022-10-12 NOTE — Patient Instructions (Signed)
Please call the care guide team at [phone] if you need to cancel or reschedule your appointment.   If you are experiencing a Mental Health or [place] or need someone to talk to, please call the Suicide and Crisis Lifeline: 988 call the [place] National Suicide Prevention Lifeline: 304-503-9736 or TTY: 901-310-0331 TTY (416)297-5682) to talk to a trained counselor call 1-800-273-TALK (toll free, 24 hour hotline)   Following is a copy of the CCM Program Consent:  CCM service includes personalized support from designated clinical staff supervised by the physician, including individualized plan of care and coordination with other care providers 24/7 contact phone numbers for assistance for urgent and routine care needs. Service will only be billed when office clinical staff spend 20 minutes or more in a month to coordinate care. Only one practitioner may furnish and bill the service in a calendar month. The patient may stop CCM services at amy time (effective at the end of the month) by phone call to the office staff. The patient will be responsible for cost sharing (co-pay) or up to 20% of the service fee (after annual deductible is met)  Following is a copy of your full provider care plan:   Goals Addressed             This Visit's Progress    CCM Expected Outcome:  Monitor, Self-Manage and Reduce Symptoms of Heart Failure       Current Barriers:  Knowledge Deficits related to the changes that can take place with heart failure and the sx and sx of heart failure Care Coordination needs related to utilization of resources and finding help in the  home in a patient with heart failure Chronic Disease Management support and education needs related to effective management of heart failure Lacks caregiver support.   Planned Interventions: Basic overview and discussion of pathophysiology of Heart Failure reviewed Provided education on low sodium diet. Education on monitoring  for sodium in diet and not using extra salt in foods. The patient does cook for self some and the patient receives MOW. The patient is mindful of sodium in his diet Reviewed Heart Failure Action Plan in depth Assessed need for readable accurate scales in home. The patient has a scale and the patient states that he can weigh daily but he does not. Education on the benefits of daily weights in monitoring for exacerbation of heart failure.  Provided education about placing scale on hard, flat surface Advised patient to weigh each morning after emptying bladder Discussed importance of daily weight and advised patient to weigh and record daily Reviewed role of diuretics in prevention of fluid overload and management of heart failure. The patient states that he does take his Lasix as prescribed and is at the 40 mg dose.  Discussed the importance of keeping all appointments with provider. The patient states that he sees the heart specialist every 6 months. Incoming call from the patient today. States that he is going to reschedule his appointment with the [place] for next week. He states that he cannot take an afternoon appointment. States that he is having a hard time today in the am. The patient states he has got to cancel transportation and everything. Offered to assist but the patient states he will do this. Thanked the [facility] for help. Encouraged the patient to get a new appointment to be seen at the [place]. The patient verbalized understanding. Will continue to monitor for changes or new needs.  Provided patient with education about  the role of exercise in the management of heart failure. The patient is limited in his ability to do exercise due to shortness of breath.  Review of elevation of feet and legs when sitting to help with fluid, the patient has swelling in his feet and legs, the patient also uses compression socks. He says when he is sitting he is normally in his recliner and has his feet elevated. Education  and support given. Advised patient to discuss changes in sx and sx of heart failure with provider Screening for signs and symptoms of depression related to chronic disease state  Assessed social determinant of health barriers  Symptom Management: Take medications as prescribed   Attend all scheduled provider appointments Call provider office for new concerns or questions  call the Suicide and Crisis Lifeline: 988 call the [place] National Suicide Prevention Lifeline: 512-808-3811 or TTY: 319-334-0127 TTY 9176632631) to talk to a trained counselor call 1-800-273-TALK (toll free, 24 hour hotline) if experiencing a Mental Health or [place]  call office if I gain more than 2 pounds in one day or 5 pounds in one week keep legs up while sitting use salt in moderation watch for swelling in feet, ankles and legs every day weigh myself daily develop a rescue plan follow rescue plan if symptoms flare-up track symptoms and what helps feel better or worse dress right for the weather, hot or cold  Follow Up Plan: Telephone follow up appointment with care management team member scheduled for: 11-08-2022 at 0900 am       CCM:  Maintain, Monitor and Self-Manage Symptoms of COPD       Current Barriers:  Knowledge Deficits related to smoking with the use of oxygen, safety concerns Care Coordination needs related to physical needs and help in the home and how to utilize resources in a patient with COPD Chronic Disease Management support and education needs related to effective management of COPD Lacks caregiver support.   Planned Interventions: Provided patient with basic written and verbal COPD education on self care/management/and exacerbation prevention Advised patient to track and manage COPD triggers. Review of triggers and how to avoid things that trigger exacerbation Provided verbal instructions on pursed lip breathing and utilized returned demonstration as teach back. The  patient uses oxygen 24/7 at 2.5 liters, has to pace activity Provided instruction about proper use of medications used for management of COPD including inhalers. The patient is compliant with medications. Uses oxygen at 2.5 liters via [place]. Discussed safety with the use of oxygen and smoking. The patient states he is safe and mindful of oxygen use when smoking.  Advised patient to self assesses COPD action plan zone and make appointment with provider if in the yellow zone for 48 hours without improvement Advised patient to engage in light exercise as tolerated 3-5 days a week to aid in the the management of COPD. Patient limited to the amount of activity he does due to getting short of breath. The patient states that he walks in his apartment and when he goes out he has a power chair he can use if needed. Education on doing leg exercises when sitting to make sure not to loose muscle mass and strength.  Provided education about and advised patient to utilize infection prevention strategies to reduce risk of respiratory infection. Education and support given Discussed the importance of adequate rest and management of fatigue with COPD. The patient tries to get most of his activity done in the am because by afternoon  he is really tired. He has help in the home one day a week and he is hopeful to get more help in the home. Education and support given. Screening for signs and symptoms of depression related to chronic disease state  Assessed social determinant of health barriers Incoming call from the patient today and states that he needs to change his appointment with the [place] for next week. He states he just cannot function in the afternoon and this will not work for him. Education on rescheduling the appointment for the [place]. He says each of you have been so helpful and set up transportation and everything. Education provided. Will continue to work with the patient for needs.   Symptom Management: Take  medications as prescribed   Attend all scheduled provider appointments Call provider office for new concerns or questions  call the Suicide and Crisis Lifeline: 988 call the [place] National Suicide Prevention Lifeline: 347-805-4500 or TTY: 216 024 9925 TTY 989-203-8879) to talk to a trained counselor call 1-800-273-TALK (toll free, 24 hour hotline) if experiencing a Mental Health or [place]  identify and avoid work-related triggers identify and remove indoor air pollutants limit outdoor activity during cold weather listen for public air quality announcements every day do breathing exercises every day develop a rescue plan eliminate symptom triggers at home follow rescue plan if symptoms flare-up  Follow Up Plan: Telephone follow up appointment with care management team member scheduled for: 11-08-2022 at 0900 am          The patient verbalized understanding of instructions, educational materials, and care plan provided today and DECLINED offer to receive copy of patient instructions, educational materials, and care plan.   Telephone follow up appointment with care management team member scheduled for: 11-08-2022 at 0900 am

## 2022-10-14 DIAGNOSIS — J4489 Other specified chronic obstructive pulmonary disease: Secondary | ICD-10-CM

## 2022-10-14 DIAGNOSIS — I5032 Chronic diastolic (congestive) heart failure: Secondary | ICD-10-CM

## 2022-10-24 DIAGNOSIS — J42 Unspecified chronic bronchitis: Secondary | ICD-10-CM | POA: Diagnosis not present

## 2022-10-24 DIAGNOSIS — J449 Chronic obstructive pulmonary disease, unspecified: Secondary | ICD-10-CM | POA: Diagnosis not present

## 2022-11-08 ENCOUNTER — Ambulatory Visit (INDEPENDENT_AMBULATORY_CARE_PROVIDER_SITE_OTHER): Payer: Medicare (Managed Care)

## 2022-11-08 ENCOUNTER — Telehealth: Payer: Medicare (Managed Care)

## 2022-11-08 DIAGNOSIS — I5032 Chronic diastolic (congestive) heart failure: Secondary | ICD-10-CM

## 2022-11-08 DIAGNOSIS — J4489 Other specified chronic obstructive pulmonary disease: Secondary | ICD-10-CM

## 2022-11-08 NOTE — Chronic Care Management (AMB) (Signed)
Chronic Care Management   CCM RN Visit Note  11/08/2022 Name: Christopher Armstrong MRN: [id number] DOB: 02-Oct-1941  Subjective: Christopher Armstrong is a 82 y.o. year old male who is a primary care patient of [name], DO. The patient was referred to the Chronic Care Management team for assistance with care management needs subsequent to provider initiation of CCM services and plan of care.    Today's Visit:  Engaged with patient by telephone for follow up visit.        Goals Addressed             This Visit's Progress    CCM Expected Outcome:  Monitor, Self-Manage and Reduce Symptoms of Heart Failure       Current Barriers:  Knowledge Deficits related to the changes that can take place with heart failure and the sx and sx of heart failure Care Coordination needs related to utilization of resources and finding help in the  home in a patient with heart failure Chronic Disease Management support and education needs related to effective management of heart failure Lacks caregiver support.   Wt Readings from Last 3 Encounters:  09/19/22 179 lb 9.6 oz (81.5 kg)  03/06/22 179 lb 9.6 oz (81.5 kg)  11/13/21 172 lb 8 oz (78.2 kg)   Patients stated weight today is 173 Planned Interventions: Basic overview and discussion of pathophysiology of Heart Failure reviewed Provided education on low sodium diet. Education on monitoring for sodium in diet and not using extra salt in foods. The patient does cook for self some and the patient receives MOW. The patient is mindful of sodium in his diet. The patient states that he is eating and he is watching his sodium. The food is bland, especially from MOW and he only has 3 teeth that he can chew with so he is having to eat softer food.  Reviewed Heart Failure Action Plan in depth. The patient states that in the past when his weight goes up he has taken an extra fluid pill as directed by the provider.  Review of monitoring for +2/3 in one day or +5 in one  week and to call the provider for changes or new onset of heart failure sx and sx.  Assessed need for readable accurate scales in home. The patient has a scale and the patient states that he can weigh daily but he does not. Education on the benefits of daily weights in monitoring for exacerbation of heart failure. Since the last outreach the patient has been weighing consistently. His weight is stable and staying around 173 and 174 Provided education about placing scale on hard, flat surface Advised patient to weigh each morning after emptying bladder Discussed importance of daily weight and advised patient to weigh and record daily Reviewed role of diuretics in prevention of fluid overload and management of heart failure. The patient states that he does take his Lasix as prescribed and is at the 40 mg dose.  Discussed the importance of keeping all appointments with provider. The patient states that he sees the heart specialist every 6 months. The patient is concerned because when  he got his last refill of Coreg it said he had to have a provider visit before it could be refilled. Will collaborate with the pcp and office staff to see if the patient can get a video visit or ask for recommendations.  Provided patient with education about the role of exercise in the management of heart failure. The patient is  limited in his ability to do exercise due to shortness of breath.  Review of elevation of feet and legs when sitting to help with fluid, the patient has swelling in his feet and legs, the patient also uses compression socks. He says when he is sitting he is normally in his recliner and has his feet elevated. Education and support given. The patient is keeping his feet elevated when sitting down. The patient states he is monitoring for extra fluid. He states it is moving up his leg more now. Advised patient to discuss changes in sx and sx of heart failure with provider Screening for signs and symptoms of  depression related to chronic disease state  Assessed social determinant of health barriers  Symptom Management: Take medications as prescribed   Attend all scheduled provider appointments Call provider office for new concerns or questions  call the Suicide and Crisis Lifeline: 988 call the [place] National Suicide Prevention Lifeline: (367) 439-0112 or TTY: 231 862 1026 TTY (314) 882-0260) to talk to a trained counselor call 1-800-273-TALK (toll free, 24 hour hotline) if experiencing a Mental Health or [place]  call office if I gain more than 2 pounds in one day or 5 pounds in one week keep legs up while sitting use salt in moderation watch for swelling in feet, ankles and legs every day weigh myself daily develop a rescue plan follow rescue plan if symptoms flare-up track symptoms and what helps feel better or worse dress right for the weather, hot or cold  Follow Up Plan: Telephone follow up appointment with care management team member scheduled for: 12-07-2022 at 0900 am       CCM:  Maintain, Monitor and Self-Manage Symptoms of COPD       Current Barriers:  Knowledge Deficits related to smoking with the use of oxygen, safety concerns Care Coordination needs related to physical needs and help in the home and how to utilize resources in a patient with COPD Chronic Disease Management support and education needs related to effective management of COPD Lacks caregiver support.   Planned Interventions: Provided patient with basic written and verbal COPD education on self care/management/and exacerbation prevention. The patient states that he is stable but he is being very careful with monitoring his breathing. He stays in and only goes out if he absolutely has to do so. Uses oxygen consistently Advised patient to track and manage COPD triggers. Review of triggers and how to avoid things that trigger exacerbation. Discussed triggers that could exacerbate his condition.  Education and support given. Provided verbal instructions on pursed lip breathing and utilized returned demonstration as teach back. The patient uses oxygen 24/7 at 2.5 liters, has to pace activity Provided instruction about proper use of medications used for management of COPD including inhalers. The patient is compliant with medications. Uses oxygen at 2.5 liters via [place]. Discussed safety with the use of oxygen and smoking. The patient states he is safe and mindful of oxygen use when smoking.  Advised patient to self assesses COPD action plan zone and make appointment with provider if in the yellow zone for 48 hours without improvement Advised patient to engage in light exercise as tolerated 3-5 days a week to aid in the the management of COPD. Patient limited to the amount of activity he does due to getting short of breath. The patient states that he walks in his apartment and when he goes out he has a power chair he can use if needed. Education on doing leg exercises when  sitting to make sure not to loose muscle mass and strength.  Provided education about and advised patient to utilize infection prevention strategies to reduce risk of respiratory infection. Education and support given Discussed the importance of adequate rest and management of fatigue with COPD. The patient tries to get most of his activity done in the am because by afternoon he is really tired. He has help in the home one day a week and he is hopeful to get more help in the home. Education and support given. Screening for signs and symptoms of depression related to chronic disease state  Assessed social determinant of health barriers The patient has changed his [place] appointment and it is now in April. The patient states that his sister gets upset with him because he keeps cancelling his appointment. Asking for assistance with seeking if he can get a video visit with the pcp for medication refills. Will collaborate with the pcp and office  to see if this is an option.  Symptom Management: Take medications as prescribed   Attend all scheduled provider appointments Call provider office for new concerns or questions  call the Suicide and Crisis Lifeline: 988 call the [place] National Suicide Prevention Lifeline: 504-535-2184 or TTY: (859) 345-3577 TTY 985-778-3978) to talk to a trained counselor call 1-800-273-TALK (toll free, 24 hour hotline) if experiencing a Mental Health or [name]  identify and avoid work-related triggers identify and remove indoor air pollutants limit outdoor activity during cold weather listen for public air quality announcements every day do breathing exercises every day develop a rescue plan eliminate symptom triggers at home follow rescue plan if symptoms flare-up  Follow Up Plan: Telephone follow up appointment with care management team member scheduled for: 12-07-2022 at 0900 am          Plan:Telephone follow up appointment with care management team member scheduled for:  12-07-2022 at 0900 am  [place], MSN, CCM RN Care Manager  Chronic Care Management Direct Number: [phone]

## 2022-11-08 NOTE — Patient Instructions (Signed)
Please call the care guide team at [phone] if you need to cancel or reschedule your appointment.   If you are experiencing a Mental Health or [place] or need someone to talk to, please call the Suicide and Crisis Lifeline: 988 call the [place] National Suicide Prevention Lifeline: 818-833-3067 or TTY: 505-843-5141 TTY 715-611-0841) to talk to a trained counselor call 1-800-273-TALK (toll free, 24 hour hotline)   Following is a copy of the CCM Program Consent:  CCM service includes personalized support from designated clinical staff supervised by the physician, including individualized plan of care and coordination with other care providers 24/7 contact phone numbers for assistance for urgent and routine care needs. Service will only be billed when office clinical staff spend 20 minutes or more in a month to coordinate care. Only one practitioner may furnish and bill the service in a calendar month. The patient may stop CCM services at amy time (effective at the end of the month) by phone call to the office staff. The patient will be responsible for cost sharing (co-pay) or up to 20% of the service fee (after annual deductible is met)  Following is a copy of your full provider care plan:   Goals Addressed             This Visit's Progress    CCM Expected Outcome:  Monitor, Self-Manage and Reduce Symptoms of Heart Failure       Current Barriers:  Knowledge Deficits related to the changes that can take place with heart failure and the sx and sx of heart failure Care Coordination needs related to utilization of resources and finding help in the  home in a patient with heart failure Chronic Disease Management support and education needs related to effective management of heart failure Lacks caregiver support.   Wt Readings from Last 3 Encounters:  09/19/22 179 lb 9.6 oz (81.5 kg)  03/06/22 179 lb 9.6 oz (81.5 kg)  11/13/21 172 lb 8 oz (78.2 kg)   Patients stated  weight today is 173 Planned Interventions: Basic overview and discussion of pathophysiology of Heart Failure reviewed Provided education on low sodium diet. Education on monitoring for sodium in diet and not using extra salt in foods. The patient does cook for self some and the patient receives MOW. The patient is mindful of sodium in his diet. The patient states that he is eating and he is watching his sodium. The food is bland, especially from MOW and he only has 3 teeth that he can chew with so he is having to eat softer food.  Reviewed Heart Failure Action Plan in depth. The patient states that in the past when his weight goes up he has taken an extra fluid pill as directed by the provider.  Review of monitoring for +2/3 in one day or +5 in one week and to call the provider for changes or new onset of heart failure sx and sx.  Assessed need for readable accurate scales in home. The patient has a scale and the patient states that he can weigh daily but he does not. Education on the benefits of daily weights in monitoring for exacerbation of heart failure. Since the last outreach the patient has been weighing consistently. His weight is stable and staying around 173 and 174 Provided education about placing scale on hard, flat surface Advised patient to weigh each morning after emptying bladder Discussed importance of daily weight and advised patient to weigh and record daily Reviewed role of diuretics  in prevention of fluid overload and management of heart failure. The patient states that he does take his Lasix as prescribed and is at the 40 mg dose.  Discussed the importance of keeping all appointments with provider. The patient states that he sees the heart specialist every 6 months. The patient is concerned because when  he got his last refill of Coreg it said he had to have a provider visit before it could be refilled. Will collaborate with the pcp and office staff to see if the patient can get a  video visit or ask for recommendations.  Provided patient with education about the role of exercise in the management of heart failure. The patient is limited in his ability to do exercise due to shortness of breath.  Review of elevation of feet and legs when sitting to help with fluid, the patient has swelling in his feet and legs, the patient also uses compression socks. He says when he is sitting he is normally in his recliner and has his feet elevated. Education and support given. The patient is keeping his feet elevated when sitting down. The patient states he is monitoring for extra fluid. He states it is moving up his leg more now. Advised patient to discuss changes in sx and sx of heart failure with provider Screening for signs and symptoms of depression related to chronic disease state  Assessed social determinant of health barriers  Symptom Management: Take medications as prescribed   Attend all scheduled provider appointments Call provider office for new concerns or questions  call the Suicide and Crisis Lifeline: 988 call the [place] National Suicide Prevention Lifeline: (618)814-5634 or TTY: 702-824-1657 TTY (910)679-9771) to talk to a trained counselor call 1-800-273-TALK (toll free, 24 hour hotline) if experiencing a Mental Health or [place]  call office if I gain more than 2 pounds in one day or 5 pounds in one week keep legs up while sitting use salt in moderation watch for swelling in feet, ankles and legs every day weigh myself daily develop a rescue plan follow rescue plan if symptoms flare-up track symptoms and what helps feel better or worse dress right for the weather, hot or cold  Follow Up Plan: Telephone follow up appointment with care management team member scheduled for: 12-07-2022 at 0900 am       CCM:  Maintain, Monitor and Self-Manage Symptoms of COPD       Current Barriers:  Knowledge Deficits related to smoking with the use of oxygen, safety  concerns Care Coordination needs related to physical needs and help in the home and how to utilize resources in a patient with COPD Chronic Disease Management support and education needs related to effective management of COPD Lacks caregiver support.   Planned Interventions: Provided patient with basic written and verbal COPD education on self care/management/and exacerbation prevention. The patient states that he is stable but he is being very careful with monitoring his breathing. He stays in and only goes out if he absolutely has to do so. Uses oxygen consistently Advised patient to track and manage COPD triggers. Review of triggers and how to avoid things that trigger exacerbation. Discussed triggers that could exacerbate his condition. Education and support given. Provided verbal instructions on pursed lip breathing and utilized returned demonstration as teach back. The patient uses oxygen 24/7 at 2.5 liters, has to pace activity Provided instruction about proper use of medications used for management of COPD including inhalers. The patient is compliant with medications. Uses  oxygen at 2.5 liters via [place]. Discussed safety with the use of oxygen and smoking. The patient states he is safe and mindful of oxygen use when smoking.  Advised patient to self assesses COPD action plan zone and make appointment with provider if in the yellow zone for 48 hours without improvement Advised patient to engage in light exercise as tolerated 3-5 days a week to aid in the the management of COPD. Patient limited to the amount of activity he does due to getting short of breath. The patient states that he walks in his apartment and when he goes out he has a power chair he can use if needed. Education on doing leg exercises when sitting to make sure not to loose muscle mass and strength.  Provided education about and advised patient to utilize infection prevention strategies to reduce risk of respiratory infection.  Education and support given Discussed the importance of adequate rest and management of fatigue with COPD. The patient tries to get most of his activity done in the am because by afternoon he is really tired. He has help in the home one day a week and he is hopeful to get more help in the home. Education and support given. Screening for signs and symptoms of depression related to chronic disease state  Assessed social determinant of health barriers The patient has changed his [place] appointment and it is now in April. The patient states that his sister gets upset with him because he keeps cancelling his appointment. Asking for assistance with seeking if he can get a video visit with the pcp for medication refills. Will collaborate with the pcp and office to see if this is an option.  Symptom Management: Take medications as prescribed   Attend all scheduled provider appointments Call provider office for new concerns or questions  call the Suicide and Crisis Lifeline: 988 call the [place] National Suicide Prevention Lifeline: 531 177 0615 or TTY: 203 173 4446 TTY 917-703-4860) to talk to a trained counselor call 1-800-273-TALK (toll free, 24 hour hotline) if experiencing a Mental Health or [place]  identify and avoid work-related triggers identify and remove indoor air pollutants limit outdoor activity during cold weather listen for public air quality announcements every day do breathing exercises every day develop a rescue plan eliminate symptom triggers at home follow rescue plan if symptoms flare-up  Follow Up Plan: Telephone follow up appointment with care management team member scheduled for: 12-07-2022 at 0900 am          The patient verbalized understanding of instructions, educational materials, and care plan provided today and DECLINED offer to receive copy of patient instructions, educational materials, and care plan.   Telephone follow up appointment with care  management team member scheduled for: 12-07-2022 at 0900 am

## 2022-11-14 DIAGNOSIS — J449 Chronic obstructive pulmonary disease, unspecified: Secondary | ICD-10-CM

## 2022-11-14 DIAGNOSIS — I5030 Unspecified diastolic (congestive) heart failure: Secondary | ICD-10-CM

## 2022-11-14 DIAGNOSIS — F17210 Nicotine dependence, cigarettes, uncomplicated: Secondary | ICD-10-CM

## 2022-11-15 ENCOUNTER — Telehealth: Payer: Self-pay | Admitting: Neurology

## 2022-11-15 DIAGNOSIS — J42 Unspecified chronic bronchitis: Secondary | ICD-10-CM | POA: Diagnosis not present

## 2022-11-15 DIAGNOSIS — J449 Chronic obstructive pulmonary disease, unspecified: Secondary | ICD-10-CM | POA: Diagnosis not present

## 2022-11-15 NOTE — Telephone Encounter (Signed)
Patient left a vm stating Duone[facility] is to be sent to [facility] in [place], not the local [facility] due to his insurance. They should be faxing a request today.

## 2022-11-16 ENCOUNTER — Ambulatory Visit (INDEPENDENT_AMBULATORY_CARE_PROVIDER_SITE_OTHER): Payer: Medicare (Managed Care) | Admitting: Family Medicine

## 2022-11-16 ENCOUNTER — Telehealth: Payer: Self-pay | Admitting: *Deleted

## 2022-11-16 DIAGNOSIS — Z0000 Encounter for general adult medical examination without abnormal findings: Secondary | ICD-10-CM | POA: Diagnosis not present

## 2022-11-16 NOTE — Telephone Encounter (Signed)
   Telephone encounter was:  Successful.  11/16/2022 Name: Christopher Armstrong MRN: [id number] DOB: Nov 17, 1940  Christopher Armstrong is a 82 y.o. year old male who is a primary care patient of [name], DO . The community resource team was consulted for assistance with Transportation Needs  patient verifying the [employer] benefit this year will reach back out to him next week  Care guide performed the following interventions: Patient provided with information about care guide support team and interviewed to confirm resource needs.  Follow Up Plan:  Care guide will follow up with patient by phone over the next days  [place] [phone] [address] , [place] [address] Email : [email or web address]

## 2022-11-16 NOTE — Patient Instructions (Addendum)
[place] Maintenance Summary and Written Plan of Care  Mr. Christopher Armstrong ,  Thank you for allowing me to perform your Medicare Annual Wellness Visit and for your ongoing commitment to your health.   Health Maintenance & Immunization History Health Maintenance  Topic Date Due   DTaP/Tdap/Td (1 - Tdap) Never done   COVID-19 Vaccine (1) 12/02/2022 (Originally 08/19/1946)   Zoster Vaccines- Shingrix (1 of 2) 02/14/2023 (Originally [birth year]19/1960)   Lung Cancer Screening  11/17/2023 (Originally 08/30/2019)   Pneumonia Vaccine 47+ Years old (1 - PCV) 11/17/2023 (Originally 08/19/1947)   Hepatitis C Screening  11/17/2023 (Originally [birth year]19/1959)   Medicare Annual Wellness (AWV)  11/17/2023   HPV VACCINES  Aged Out   INFLUENZA VACCINE  Discontinued    There is no immunization history on file for this patient.  These are the patient goals that we discussed:  Goals Addressed               This Visit's Progress     Patient Stated (pt-stated)        Patient stated that he would like to be in better shape. He states that he can tell he is declining.          This is a list of Health Maintenance Items that are overdue or due now: Health Maintenance Due  Topic Date Due   DTaP/Tdap/Td (1 - Tdap) Never done   Pneumococcal vaccine  Td vaccine Shingrix vaccine  Patient declines vaccine at this time.  Orders/Referrals Placed Today: No orders of the defined types were placed in this encounter.  (Contact our referral department at [phone] if you have not spoken with someone about your referral appointment within the next 5 days)    Follow-up Plan Follow-up with [name], DO as planned Medicare wellness visit in one year. AVS printed and mailed to the patient.       Health Maintenance, Male Adopting a healthy lifestyle and getting preventive care are important in promoting health and wellness. Ask your health care provider about: The right schedule  for you to have regular tests and exams. Things you can do on your own to prevent diseases and keep yourself healthy. What should I know about diet, weight, and exercise? Eat a healthy diet  Eat a diet that includes plenty of vegetables, fruits, low-fat dairy products, and lean protein. Do not eat a lot of foods that are high in solid fats, added sugars, or sodium. Maintain a healthy weight Body mass index (BMI) is a measurement that can be used to identify possible weight problems. It estimates body fat based on height and weight. Your health care provider can help determine your BMI and help you achieve or maintain a healthy weight. Get regular exercise Get regular exercise. This is one of the most important things you can do for your health. Most adults should: Exercise for at least 150 minutes each week. The exercise should increase your heart rate and make you sweat (moderate-intensity exercise). Do strengthening exercises at least twice a week. This is in addition to the moderate-intensity exercise. Spend less time sitting. Even light physical activity can be beneficial. Watch cholesterol and blood lipids Have your blood tested for lipids and cholesterol at 80 years of age, then have this test every 5 years. You may need to have your cholesterol levels checked more often if: Your lipid or cholesterol levels are high. You are older than 80 years of age. You are at high risk for  heart disease. What should I know about cancer screening? Many types of cancers can be detected early and may often be prevented. Depending on your health history and family history, you may need to have cancer screening at various ages. This may include screening for: Colorectal cancer. Prostate cancer. Skin cancer. Lung cancer. What should I know about heart disease, diabetes, and high blood pressure? Blood pressure and heart disease High blood pressure causes heart disease and increases the risk of stroke.  This is more likely to develop in people who have high blood pressure readings or are overweight. Talk with your health care provider about your target blood pressure readings. Have your blood pressure checked: Every 3-5 years if you are 33-22 years of age. Every year if you are 89 years old or older. If you are between the ages of 76 and 24 and are a current or former smoker, ask your health care provider if you should have a one-time screening for abdominal aortic aneurysm (AAA). Diabetes Have regular diabetes screenings. This checks your fasting blood sugar level. Have the screening done: Once every three years after age 1 if you are at a normal weight and have a low risk for diabetes. More often and at a younger age if you are overweight or have a high risk for diabetes. What should I know about preventing infection? Hepatitis B If you have a higher risk for hepatitis B, you should be screened for this virus. Talk with your health care provider to find out if you are at risk for hepatitis B infection. Hepatitis C Blood testing is recommended for: Everyone born from [birth year] through 1963. Anyone with known risk factors for hepatitis C. Sexually transmitted infections (STIs) You should be screened each year for STIs, including gonorrhea and chlamydia, if: You are sexually active and are younger than 80 years of age. You are older than 80 years of age and your health care provider tells you that you are at risk for this type of infection. Your sexual activity has changed since you were last screened, and you are at increased risk for chlamydia or gonorrhea. Ask your health care provider if you are at risk. Ask your health care provider about whether you are at high risk for HIV. Your health care provider may recommend a prescription medicine to help prevent HIV infection. If you choose to take medicine to prevent HIV, you should first get tested for HIV. You should then be tested every 3 months  for as long as you are taking the medicine. Follow these instructions at home: Alcohol use Do not drink alcohol if your health care provider tells you not to drink. If you drink alcohol: Limit how much you have to 0-2 drinks a day. Know how much alcohol is in your drink. In the [place] one drink equals one 12 oz bottle of beer (355 mL), one 5 oz glass of wine (148 mL), or one 1 oz glass of hard liquor (44 mL). Lifestyle Do not use any products that contain nicotine or tobacco. These products include cigarettes, chewing tobacco, and vaping devices, such as e-cigarettes. If you need help quitting, ask your health care provider. Do not use street drugs. Do not share needles. Ask your health care provider for help if you need support or information about quitting drugs. General instructions Schedule regular health, dental, and eye exams. Stay current with your vaccines. Tell your health care provider if: You often feel depressed. You have ever been abused or do  not feel safe at home. Summary Adopting a healthy lifestyle and getting preventive care are important in promoting health and wellness. Follow your health care provider's instructions about healthy diet, exercising, and getting tested or screened for diseases. Follow your health care provider's instructions on monitoring your cholesterol and blood pressure. This information is not intended to replace advice given to you by your health care provider. Make sure you discuss any questions you have with your health care provider. Document Revised: 02/20/2021 Document Reviewed: 02/20/2021 Elsevier Patient Education  2023 Elsevier Inc.  

## 2022-11-16 NOTE — Progress Notes (Signed)
MEDICARE ANNUAL WELLNESS VISIT  11/16/2022  Telephone Visit Disclaimer This Medicare AWV was conducted by telephone due to national recommendations for restrictions regarding the COVID-19 Pandemic (e.g. social distancing).  I verified, using two identifiers, that I am speaking with Christopher Armstrong or their authorized healthcare agent. I discussed the limitations, risks, security, and privacy concerns of performing an evaluation and management service by telephone and the potential availability of an in-person appointment in the future. The patient expressed understanding and agreed to proceed.  Location of Patient: Home Location of Provider (nurse):  In the office.  Subjective:    Christopher Armstrong is a 82 y.o. male patient of [name], DO who had a Medicare Annual Wellness Visit today via telephone. Christopher Armstrong is Retired and lives alone. he has 1 child. he reports that he is socially active and does interact with friends/family regularly. he is minimally physically active and enjoys watching movies.  Patient Care Team: [name], DO as PCP - General (Family Medicine) Darius Bump, [facility] as Pharmacist (Pharmacist) Vanita Ingles, RN as Case Manager (General Practice)     11/16/2022    8:34 AM 11/13/2021    8:21 AM 10/17/2020   10:01 AM 05/25/2019   11:09 AM  Advanced Directives  Does Patient Have a Medical Advance Directive? Yes Yes Yes No  Type of Advance Directive Living will Living will;Healthcare Power of [place]   Does patient want to make changes to medical advance directive? No - Patient declined No - Patient declined No - Patient declined   Copy of [place] in Chart?  No - copy requested No - copy requested   Would patient like information on creating a medical advance directive?   No - Patient declined No - Patient declined    Hospital Utilization Over the Past 12 Months: # of hospitalizations or ER visits: 1 # of  surgeries: 0  Review of Systems    Patient reports that his overall health is worse compared to last year.  History obtained from chart review and the patient  Patient Reported Readings (BP, Pulse, CBG, Weight, etc) none  Pain Assessment Pain : No/denies pain     Current Medications & Allergies (verified) Allergies as of 11/16/2022       Reactions   Clopidogrel Other (See Comments)   "Purple blood pockets clots in mouth" Blood blisters in roof of mouth, urinary retention   Ticagrelor Shortness Of Breath, Other (See Comments)   Atorvastatin Other (See Comments)   Simvastatin    Statins Nausea And Vomiting, Other (See Comments)   Finasteride Other (See Comments)   Guaifenesin Other (See Comments)   Hypotension   Terazosin    Other reaction(s): Orthostatic hypotension        Medication List        Accurate as of November 16, 2022  8:46 AM. If you have any questions, ask your nurse or doctor.          albuterol 108 (90 Base) MCG/ACT inhaler Commonly known as: VENTOLIN HFA Inhale 1-2 puffs into the lungs every 6 (six) hours as needed for wheezing or shortness of breath.   AMBULATORY NON FORMULARY MEDICATION Motorized wheelchair per patient preference and insurance coverage. Patient suffers from copd and atrial fibrillation and debility d/t recent hospitalization which impairs their ability to perform daily activities like bathing, dressing, grooming, and toileting in the home.  A cane, crutch,walker or manual wheelchair will not resolve  issue with performing activities of daily living. A  power wheelchair will allow patient to safely perform daily activities. Patient unable to safely propel the wheelchair and does not have a caregiver who can provide assistance. Length of need Lifetime.  Fax [phone]   amLODipine 5 MG tablet Commonly known as: NORVASC Take 1 tablet by mouth daily.   apixaban 5 MG Tabs tablet Commonly known as: ELIQUIS Take 1 tablet (5 mg total)  by mouth 2 (two) times daily. What changed: how much to take   aspirin EC 81 MG tablet Take 1 tablet (81 mg total) by mouth daily.   budesonide-formoterol 160-4.5 MCG/ACT inhaler Commonly known as: SYMBICORT Inhale 2 puffs into the lungs daily.   carvedilol 12.5 MG tablet Commonly known as: COREG Take 1 tablet (12.5 mg total) by mouth 2 (two) times daily with a meal. No refills without appointment.   diltiazem 30 MG tablet Commonly known as: CARDIZEM Take by mouth.   furosemide 40 MG tablet Commonly known as: LASIX TAKE 1/2 TO 1 (ONE-HALF TO ONE) TABLET BY MOUTH ONCE DAILY   ipratropium-albuterol 0.5-2.5 (3) MG/3ML Soln Commonly known as: DUONEB Take 3 mLs by nebulization every 4 (four) hours as needed.   lisinopril 40 MG tablet Commonly known as: ZESTRIL Take 1 tablet (40 mg total) by mouth daily.   magnesium hydroxide 400 MG/5ML suspension Commonly known as: MILK OF MAGNESIA Take 30 mLs by mouth as needed.   nitroGLYCERIN 0.4 MG SL tablet Commonly known as: NITROSTAT Place 1 tablet (0.4 mg total) under the tongue every 5 (five) minutes as needed for chest pain.   OXYGEN Inhale into the lungs daily. 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   Tobacco use disorder, severe, dependence 08/26/2018   Past Surgical History:  Procedure Laterality Date   BLADDER TUMOR EXCISION     CORONARY ANGIOPLASTY WITH STENT PLACEMENT     PROSTATE SURGERY     TONSILLECTOMY      Family History  Problem Relation Age of Onset   High blood pressure Mother    Heart failure Mother    Lung cancer Father    High blood pressure Sister    Fibromyalgia Sister    Social History   Socioeconomic History   Marital status: Divorced    Spouse name: Not on file   Number of children: 1   Years of education: 12   Highest education level: 12th grade  Occupational History   Occupation: Architect    Comment: retired  Tobacco Use   Smoking status: Every Day    Packs/day: 0.50    Years: 65.00    Total pack years: 32.50    Types: Cigarettes   Smokeless tobacco: Never  Vaping Use   Vaping Use: Never used  Substance and Sexual Activity   Alcohol use: Not Currently    Alcohol/week: 0.0 standard drinks of alcohol    Comment: 0   Drug use: Never   Sexual activity: Not Currently    Partners: Female  Other Topics Concern   Not on file  Social History Narrative   Lives alone. His child lives in [place]. His sister and brother in law live in [place] in case he needs anything. He enjoy watching movies.   Social Determinants of Health   Financial Resource Strain: Low Risk  (11/16/2022)   Overall Financial Resource Strain (CARDIA)    Difficulty of Paying Living Expenses: Not hard at all  Food Insecurity: No Food Insecurity (11/16/2022)   Hunger Vital Sign    Worried About Running Out of Food in the Last Year: Never true    Ran Out of Food in the Last Year: Never true  Transportation Needs: Unmet Transportation Needs (11/16/2022)   PRAPARE - Transportation    Lack of Transportation (Medical): Yes    Lack of Transportation (Non-Medical): Yes  Physical Activity: Inactive (11/16/2022)   Exercise Vital Sign    Days of Exercise per Week: 0 days    Minutes of Exercise per Session: 0 min  Stress: No Stress Concern Present (11/16/2022)   [place]    Feeling of Stress : Only a little  Social Connections:  Socially Isolated (11/16/2022)   Social Connection and Isolation Panel [NHANES]    Frequency of Communication with Friends and Family: Once a week    Frequency of Social Gatherings with Friends and Family: Never    Attends Religious Services: Never    Marine scientist or Organizations: No    Attends Archivist Meetings: Never    Marital Status: Widowed    Activities of Daily Living    11/16/2022    8:36 AM  In your present state of health, do you have any difficulty performing the following activities:  Hearing? 1  Comment hard of hearing  Vision? 0  Comment wears glasses  Difficulty concentrating or making decisions? 0  Walking or climbing stairs? 1  Comment wheelchair, walker and oxygen  Dressing or bathing? 1  Comment needs assistance with the bathing  Doing errands, shopping? 1  Comment does not drive  Preparing Food and eating ? Y  Comment he has meals on wheels  Using the Toilet? N  In the past six months, have you accidently leaked urine? N  Do you have problems with loss of bowel control? N  Managing your Medications? N  Managing your Finances? N  Housekeeping or managing your Housekeeping? Y  Comment he has someone who does the housekeeping    Patient Education/ Literacy How often do you need to have someone help you when you read instructions, pamphlets, or other written materials from your doctor or pharmacy?: 1 - Never What is the last grade level you completed in school?: 12th grade  Exercise Current Exercise Habits: The patient does not participate in regular exercise at present, Exercise limited by: respiratory conditions(s);orthopedic condition(s)  Diet Patient reports consuming 2 meals a day and 2 snack(s) a day Patient reports that his primary diet is: Regular Patient reports that she does have regular access to food.   Depression Screen    11/16/2022    8:34 AM 10/09/2022    9:18 AM 11/13/2021    8:22 AM 10/17/2020   10:13 AM 10/17/2020    10:09 AM 06/23/2019    9:17 AM 05/25/2019   11:10 AM  PHQ 2/9 Scores  PHQ - 2 Score 0 0 0 '1 1 1 '$ 0  PHQ- 9 Score    1 1 3  Fall Risk    11/16/2022    8:34 AM 10/09/2022    9:22 AM 11/13/2021    8:22 AM 10/17/2020   10:08 AM 04/26/2020   10:57 AM  Fall Risk   Falls in the past year? 0 0 0 1 1  Number falls in past yr: 0 0 0 1 0  Injury with Fall? 0 0 0 0 1  Risk for fall due to : Impaired mobility Impaired balance/gait No Fall Risks History of fall(s) Impaired mobility  Follow up Falls evaluation completed;Education provided;Falls prevention discussed Falls evaluation completed;Education provided Falls evaluation completed Falls evaluation completed Education provided     Objective:  Christopher Armstrong seemed alert and oriented and he participated appropriately during our telephone visit.  Blood Pressure Weight BMI  BP Readings from Last 3 Encounters:  03/06/22 (!) 166/67  11/13/21 (!) 148/57  08/15/21 (!) 187/66   Wt Readings from Last 3 Encounters:  09/19/22 179 lb 9.6 oz (81.5 kg)  03/06/22 179 lb 9.6 oz (81.5 kg)  11/13/21 172 lb 8 oz (78.2 kg)   BMI Readings from Last 1 Encounters:  09/19/22 26.52 kg/m    *Unable to obtain current vital signs, weight, and BMI due to telephone visit type  Hearing/Vision  Christopher Armstrong did not seem to have difficulty with hearing/understanding during the telephone conversation Reports that he has not had a formal eye exam by an eye care professional within the past year Reports that he has not had a formal hearing evaluation within the past year *Unable to fully assess hearing and vision during telephone visit type  Cognitive Function:    11/16/2022    8:42 AM 11/13/2021    8:30 AM 10/17/2020   10:06 AM 05/25/2019   11:12 AM  6CIT Screen  What Year? 0 points 0 points 0 points 0 points  What month? 0 points 0 points 0 points 0 points  What time? 0 points 0 points 0 points 0 points  Count back from 20 0 points 0 points 0 points 0 points   Months in reverse 0 points 0 points 0 points 0 points  Repeat phrase 2 points 0 points 0 points 2 points  Total Score 2 points 0 points 0 points 2 points   (Normal:0-7, Significant for Dysfunction: >8)  Normal Cognitive Function Screening: Yes   Immunization & Health Maintenance Record  There is no immunization history on file for this patient.  Health Maintenance  Topic Date Due   DTaP/Tdap/Td (1 - Tdap) Never done   COVID-19 Vaccine (1) 12/02/2022 (Originally 08/19/1946)   Zoster Vaccines- Shingrix (1 of 2) 02/14/2023 (Originally 08/19/1960)   Lung Cancer Screening  11/17/2023 (Originally 08/30/2019)   Pneumonia Vaccine 61+ Years old (1 - PCV) 11/17/2023 (Originally 08/19/1947)   Hepatitis C Screening  11/17/2023 (Originally 08/19/1959)   Medicare Annual Wellness (AWV)  11/17/2023   HPV VACCINES  Aged Out   INFLUENZA VACCINE  Discontinued       Assessment  This is a routine wellness examination for [employer].  Health Maintenance: Due or Overdue Health Maintenance Due  Topic Date Due   DTaP/Tdap/Td (1 - Tdap) Never done    Christopher Armstrong does not need a referral for Community Assistance: Care Management:   no Social Work:    no Prescription Assistance:  no Nutrition/Diabetes Education:  no   Plan:  Personalized Goals  Goals Addressed               This Visit's  Progress     Patient Stated (pt-stated)        Patient stated that he would like to be in better shape. He states that he can tell he is declining.        Personalized Health Maintenance & Screening Recommendations  Pneumococcal vaccine  Td vaccine Shingrix vaccine  Patient declines vaccine at this time.  Lung Cancer Screening Recommended: yes; patient declines at this time. (Low Dose CT Chest recommended if Age 82-80 years, 30 pack-year currently smoking OR have quit w/in past 15 years) Hepatitis C Screening recommended: yes HIV Screening recommended: no  Advanced Directives: Written  information was not prepared per patient's request.  Referrals & Orders No orders of the defined types were placed in this encounter.   Follow-up Plan Follow-up with [name], DO as planned Medicare wellness visit in one year. AVS printed and mailed to the patient.    I have personally reviewed and noted the following in the patient's chart:   Medical and social history Use of alcohol, tobacco or illicit drugs  Current medications and supplements Functional ability and status Nutritional status Physical activity Advanced directives List of other physicians Hospitalizations, surgeries, and ER visits in previous 12 months Vitals Screenings to include cognitive, depression, and falls Referrals and appointments  In addition, I have reviewed and discussed with Christopher Armstrong certain preventive protocols, quality metrics, and best practice recommendations. A written personalized care plan for preventive services as well as general preventive health recommendations is available and can be mailed to the patient at his request.      Tinnie Gens, RN BSN  11/16/2022

## 2022-11-20 ENCOUNTER — Telehealth: Payer: Self-pay | Admitting: Licensed Clinical Social Worker

## 2022-11-20 ENCOUNTER — Telehealth: Payer: Self-pay | Admitting: *Deleted

## 2022-11-20 NOTE — Telephone Encounter (Signed)
   Telephone encounter was:  Successful.  11/20/2022 Name: Christopher Armstrong MRN: [id number] DOB: 01/17/1941  Christopher Armstrong is a 82 y.o. year old male who is a primary care patient of [name], DO . The community resource team was consulted for assistance with Transportation Needs   Care guide performed the following interventions: Patient provided with information about care guide support team and interviewed to confirm resource needs. Patient receives 14 round trip rides and patietn said out of the last 4 rides the transport did not show up ofr 2 trips , will passon his complaint about [facility] transport as patient voiced with out this tranportation he just have to make less of his appts  Follow Up Plan:  No further follow up planned at this time. The patient has been provided with needed resources.  [place] [phone] [address] , [place] [address] Email : [email or web address]

## 2022-11-21 NOTE — Patient Outreach (Signed)
  Care Coordination   Follow Up Visit Note   11/20/22 Name: Christopher Armstrong MRN: [id number] DOB: [birth year]  Christopher Armstrong is a 82 y.o. year old male who sees [name], DO for primary care. I spoke with  Christopher Armstrong by phone today.  What matters to the patients health and wellness today?  Transportation    Goals Addressed             This Visit's Progress    Obtain Personal Care Aid   On track    Care Coordination Interventions: Solution-Focused Strategies employed:  Active listening / Reflection utilized  Emotional Support Provided Verbalization of feelings encouraged  Patient endorsed stress after oxygen concentrator broke at 2 AM. This was resolved today and smaller oxygen containers were replaced LCSW reviewed upcoming appts. Pt has appts scheduled with Cardiologist and PCP same day through [place]. He will address Personal Care Services and [place] transportation Patient spoke with Care Guide regarding transportation barriers with Christopher Armstrong, who has hx of not picking pt up. He is very concerned that he will miss his appts utilizing [employer]. LCSW will collaborate with [facility] and Christopher Armstrong (ensure transportation is wheelchair accessible) Pt's sister continues to visit, came by today. Patient is planning to utilize a planner to maintain medical appts Pt is recording BP readings daily to provide to PCP            SDOH assessments and interventions completed:  No     Care Coordination Interventions:  Yes, provided   Follow up plan: Follow up call scheduled for 2-4 weeks    Encounter Outcome:  Pt. Visit Completed   [name], MSW, [email or web address] Phone [phone] 4:31 PM

## 2022-11-21 NOTE — Patient Instructions (Signed)
Visit Information  Thank you for taking time to visit with me today. Please don't hesitate to contact me if I can be of assistance to you.   Following are the goals we discussed today:   Goals Addressed             This Visit's Progress    Obtain Personal Care Aid   On track    Care Coordination Interventions: Solution-Focused Strategies employed:  Active listening / Reflection utilized  Emotional Support Provided Verbalization of feelings encouraged  Patient endorsed stress after oxygen concentrator broke at 2 AM. This was resolved today and smaller oxygen containers were replaced LCSW reviewed upcoming appts. Pt has appts scheduled with Cardiologist and PCP same day through [place]. He will address Personal Care Services and [place] transportation Patient spoke with Care Guide regarding transportation barriers with Christella Scheuermann, who has hx of not picking pt up. He is very concerned that he will miss his appts utilizing [employer]. LCSW will collaborate with [facility] and Christella Scheuermann (ensure transportation is wheelchair accessible) Pt's sister continues to visit, came by today. Patient is planning to utilize a planner to maintain medical appts Pt is recording BP readings daily to provide to PCP            Our next appointment is by telephone on 12/04/22 at 11 AM  Please call the care guide team at [phone] if you need to cancel or reschedule your appointment.   If you are experiencing a Mental Health or [place] or need someone to talk to, please call the Suicide and Crisis Lifeline: 988 call 911   The patient verbalized understanding of instructions, educational materials, and care plan provided today and DECLINED offer to receive copy of patient instructions, educational materials, and care plan.   [name], MSW, [email or web address] Phone [phone] 4:33 PM

## 2022-11-26 ENCOUNTER — Encounter: Payer: Self-pay | Admitting: Family Medicine

## 2022-11-26 ENCOUNTER — Telehealth (INDEPENDENT_AMBULATORY_CARE_PROVIDER_SITE_OTHER): Payer: Medicare (Managed Care) | Admitting: Family Medicine

## 2022-11-26 VITALS — Ht 69.0 in | Wt 179.6 lb

## 2022-11-26 DIAGNOSIS — J4489 Other specified chronic obstructive pulmonary disease: Secondary | ICD-10-CM | POA: Diagnosis not present

## 2022-11-26 DIAGNOSIS — I10 Essential (primary) hypertension: Secondary | ICD-10-CM

## 2022-11-26 MED ORDER — CARVEDILOL 12.5 MG PO TABS: 12.5000 mg | ORAL_TABLET | Freq: Two times a day (BID) | ORAL | 3 refills | Status: AC

## 2022-11-26 NOTE — Assessment & Plan Note (Signed)
Prescription for nebulizer equipment sent directly to DME supplier pharmacy.

## 2022-11-26 NOTE — Progress Notes (Signed)
Christopher Armstrong - 82 y.o. male MRN [id number]  Date of birth: 07-19-1941    All issues noted in this document were discussed and addressed.  No physical exam was performed (except for noted visual exam findings with Video Visits).  I discussed the limitations of evaluation and management by telemedicine and the availability of in person appointments. The patient expressed understanding and agreed to proceed.  I connected withNAME@ on 11/26/22 at  1:10 PM EST by a video enabled telemedicine application and verified that I am speaking with the correct person using two identifiers.  Present at visit: Christopher Nutting, DO Christopher Armstrong   Patient Location: [place] [address]   Provider location:   [facility]  Chief Complaint  Patient presents with   Medication Refill    HPI  Christopher Armstrong is a 82 y.o. male who presents via audio/video conferencing for a telehealth visit today.  Reports he needs refill on carvedilol.  Doing well with current medications for management of hypertension.  No side effects at this time.  Has not had chest pain.  No increased shortness of breath, headache or vision change.  He is needing DME supplies for nebulizer.  His supplier is not forwarding prescriptions to dispensing pharmacy in [place].   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   Tobacco use disorder, severe, dependence 08/26/2018    Past Surgical History:   Procedure Laterality Date   BLADDER TUMOR EXCISION     CORONARY ANGIOPLASTY WITH STENT PLACEMENT     PROSTATE SURGERY     TONSILLECTOMY      Family History  Problem Relation Age of Onset   High blood pressure Mother    Heart failure Mother    Lung cancer Father    High blood pressure Sister    Fibromyalgia Sister     Social History   Socioeconomic History   Marital status: Divorced    Spouse name: Not on file   Number of children: 1   Years of education: 12   Highest education level: 12th grade  Occupational History   Occupation: Architect    Comment: retired  Tobacco Use   Smoking status: Every Day    Packs/day: 0.50    Years: 65.00    Total pack years: 32.50    Types: Cigarettes   Smokeless tobacco: Never  Vaping Use   Vaping Use: Never used  Substance and Sexual Activity   Alcohol use: Not Currently    Alcohol/week: 0.0 standard drinks of alcohol    Comment: 0   Drug use: Never   Sexual activity: Not Currently    Partners: Female  Other Topics Concern   Not on file  Social History Narrative   Lives alone. His child lives in [place]. His sister and brother in law live in [place] in case he needs anything. He enjoy watching movies.   Social Determinants of Health   Financial Resource Strain: Low Risk  (11/16/2022)   Overall Financial  Resource Strain (CARDIA)    Difficulty of Paying Living Expenses: Not hard at all  Food Insecurity: No Food Insecurity (11/16/2022)   Hunger Vital Sign    Worried About Running Out of Food in the Last Year: Never true    Ran Out of Food in the Last Year: Never true  Transportation Needs: Unmet Transportation Needs (11/16/2022)   PRAPARE - Transportation    Lack of Transportation (Medical): Yes    Lack of Transportation (Non-Medical): Yes  Physical Activity: Inactive (11/16/2022)   Exercise Vital Sign    Days of Exercise per Week: 0 days    Minutes of Exercise per Session: 0 min  Stress: No Stress Concern Present (11/16/2022)    [place]    Feeling of Stress : Only a little  Social Connections: Socially Isolated (11/16/2022)   Social Connection and Isolation Panel [NHANES]    Frequency of Communication with Friends and Family: Once a week    Frequency of Social Gatherings with Friends and Family: Never    Attends Religious Services: Never    Marine scientist or Organizations: No    Attends Archivist Meetings: Never    Marital Status: Widowed  Intimate Partner Violence: Not At Risk (11/16/2022)   Humiliation, Afraid, Rape, and Kick questionnaire    Fear of Current or Ex-Partner: No    Emotionally Abused: No    Physically Abused: No    Sexually Abused: No     Current Outpatient Medications:    albuterol (VENTOLIN HFA) 108 (90 Base) MCG/ACT inhaler, Inhale 1-2 puffs into the lungs every 6 (six) hours as needed for wheezing or shortness of breath., Disp: 18 g, Rfl: 5   AMBULATORY NON FORMULARY MEDICATION, Motorized wheelchair per patient preference and insurance coverage. Patient suffers from copd and atrial fibrillation and debility d/t recent hospitalization which impairs their ability to perform daily activities like bathing, dressing, grooming, and toileting in the home.  A cane, crutch,walker or manual wheelchair will not resolve issue with performing activities of daily living. A  power wheelchair will allow patient to safely perform daily activities. Patient unable to safely propel the wheelchair and does not have a caregiver who can provide assistance. Length of need Lifetime.  Fax [phone], Disp: 1 Units, Rfl: 99   amLODipine (NORVASC) 5 MG tablet, Take 1 tablet by mouth daily., Disp: , Rfl:    apixaban (ELIQUIS) 5 MG TABS tablet, Take 1 tablet (5 mg total) by mouth 2 (two) times daily. (Patient taking differently: Take 2.5 mg by mouth 2 (two) times daily.), Disp: 60 tablet, Rfl: 2   aspirin EC 81 MG tablet, Take 1 tablet (81  mg total) by mouth daily., Disp: 90 tablet, Rfl: 3   budesonide-formoterol (SYMBICORT) 160-4.5 MCG/ACT inhaler, Inhale 2 puffs into the lungs daily., Disp: , Rfl:    furosemide (LASIX) 40 MG tablet, TAKE 1/2 TO 1 (ONE-HALF TO ONE) TABLET BY MOUTH ONCE DAILY, Disp: 90 tablet, Rfl: 3   ipratropium-albuterol (DUONEB) 0.5-2.5 (3) MG/3ML SOLN, Take 3 mLs by nebulization every 4 (four) hours as needed., Disp: 30 mL, Rfl: 11   lisinopril (ZESTRIL) 40 MG tablet, Take 1 tablet (40 mg total) by mouth daily., Disp: 90 tablet, Rfl: 3   magnesium hydroxide (MILK OF MAGNESIA) 400 MG/5ML suspension, Take 30 mLs by mouth as needed., Disp: , Rfl:    nitroGLYCERIN (NITROSTAT) 0.4 MG SL tablet, Place 1 tablet (0.4 mg total) under the tongue every  5 (five) minutes as needed for chest pain., Disp: 20 tablet, Rfl: 1   OXYGEN, Inhale into the lungs daily. 2L, Disp: , Rfl:    Respiratory Therapy Supplies MISC, Nebulizer tubing, Disp: 3 Units, Rfl: 3   carvedilol (COREG) 12.5 MG tablet, Take 1 tablet (12.5 mg total) by mouth 2 (two) times daily with a meal., Disp: 180 tablet, Rfl: 3  EXAM:  VITALS per patient if applicable: Ht 5' 9"$  (1.753 m)   Wt 179 lb 9.6 oz (81.5 kg)   BMI 26.52 kg/m   GENERAL: alert, oriented, appears well and in no acute distress  HEENT: atraumatic, conjunttiva clear, no obvious abnormalities on inspection of external nose and ears  NECK: normal movements of the head and neck  LUNGS: on inspection no signs of respiratory distress, breathing rate appears normal, no obvious gross SOB, gasping or wheezing  CV: no obvious cyanosis  MS: moves all visible extremities without noticeable abnormality  PSYCH/NEURO: pleasant and cooperative, no obvious depression or anxiety, speech and thought processing grossly intact  ASSESSMENT AND PLAN:  Discussed the following assessment and plan:  Essential hypertension Doing well on current antihypertensives.  Carvedilol renewed.  Continue lisinopril  at current strength.  COPD (chronic obstructive pulmonary disease) with chronic bronchitis ([place]) Prescription for nebulizer equipment sent directly to DME supplier pharmacy.     I discussed the assessment and treatment plan with the patient. The patient was provided an opportunity to ask questions and all were answered. The patient agreed with the plan and demonstrated an understanding of the instructions.   The patient was advised to call back or seek an in-person evaluation if the symptoms worsen or if the condition fails to improve as anticipated.    Christopher Nutting, DO

## 2022-11-26 NOTE — Progress Notes (Signed)
Trying to get a Rx in [place] for nebulizer mouthpieces. [employer] in [place].

## 2022-11-26 NOTE — Assessment & Plan Note (Signed)
Doing well on current antihypertensives.  Carvedilol renewed.  Continue lisinopril at current strength.

## 2022-12-04 ENCOUNTER — Ambulatory Visit: Payer: Self-pay | Admitting: Licensed Clinical Social Worker

## 2022-12-06 NOTE — Patient Outreach (Signed)
  Care Coordination Late Entry  Follow Up Visit Note   Encounter completed 12/04/22 Name: Christopher Armstrong MRN: [id number] DOB: May 15, 1941  Christopher Armstrong is a 82 y.o. year old male who sees Christopher Nutting, DO for primary care. I spoke with  Christopher Armstrong by phone today.  What matters to the patients health and wellness today?  Patient Assistance    Goals Addressed             This Visit's Progress    Obtain Personal Care Aid   On track    Care Coordination Interventions: Solution-Focused Strategies employed:  Active listening / Reflection utilized  Emotional Support Provided Verbalization of feelings encouraged  Patient endorsed stress after oxygen concentrator broke at 2 AM. This was resolved today and smaller oxygen containers were replaced LCSW reviewed upcoming appts. Pt has appts scheduled with Cardiologist and PCP same day through [place]. He will address Personal Care Services and [place] transportation Patient spoke with Care Guide regarding transportation barriers with Christopher Armstrong, who has hx of not picking pt up. He is very concerned that he will miss his appts utilizing [employer]. LCSW will collaborate with [facility] and Christopher Armstrong (ensure transportation is wheelchair accessible) Pt's sister continues to visit, came by today. Patient is planning to utilize a planner to maintain medical appts Pt is recording BP readings daily to provide to PCP            SDOH assessments and interventions completed:  No     Care Coordination Interventions:  Yes, provided   Follow up plan: Follow up call scheduled for 2 weeks    Encounter Outcome:  Pt. Visit Completed   Christopher Armstrong, MSW, [email or web address] Phone [phone] 11:02 AM

## 2022-12-06 NOTE — Patient Instructions (Signed)
Visit Information  Thank you for taking time to visit with me today. Please don't hesitate to contact me if I can be of assistance to you.   Following are the goals we discussed today:   Goals Addressed             This Visit's Progress    Obtain Personal Care Aid   On track    Care Coordination Interventions: Solution-Focused Strategies employed:  Active listening / Reflection utilized  Emotional Support Provided Verbalization of feelings encouraged  Patient endorsed stress after oxygen concentrator broke at 2 AM. This was resolved today and smaller oxygen containers were replaced LCSW reviewed upcoming appts. Pt has appts scheduled with Cardiologist and PCP same day through [place]. He will address Personal Care Services and [place] transportation Patient spoke with Care Guide regarding transportation barriers with Christella Scheuermann, who has hx of not picking pt up. He is very concerned that he will miss his appts utilizing [employer]. LCSW will collaborate with [facility] and Christella Scheuermann (ensure transportation is wheelchair accessible) Pt's sister continues to visit, came by today. Patient is planning to utilize a planner to maintain medical appts Pt is recording BP readings daily to provide to PCP            Our next appointment is by telephone on 02/27 at 11 AM  Please call the care guide team at [phone] if you need to cancel or reschedule your appointment.   If you are experiencing a Mental Health or [place] or need someone to talk to, please call the Suicide and Crisis Lifeline: 988 call 911   The patient verbalized understanding of instructions, educational materials, and care plan provided today and DECLINED offer to receive copy of patient instructions, educational materials, and care plan.   [name], MSW, [email or web address] Phone [phone] 11:03 AM'

## 2022-12-07 ENCOUNTER — Other Ambulatory Visit: Payer: Medicare (Managed Care) | Admitting: Pharmacist

## 2022-12-07 ENCOUNTER — Ambulatory Visit: Payer: Self-pay

## 2022-12-07 ENCOUNTER — Ambulatory Visit (INDEPENDENT_AMBULATORY_CARE_PROVIDER_SITE_OTHER): Payer: Medicare (Managed Care)

## 2022-12-07 DIAGNOSIS — J4489 Other specified chronic obstructive pulmonary disease: Secondary | ICD-10-CM

## 2022-12-07 DIAGNOSIS — I5032 Chronic diastolic (congestive) heart failure: Secondary | ICD-10-CM

## 2022-12-07 NOTE — Patient Instructions (Signed)
Please call the care guide team at [phone] if you need to cancel or reschedule your appointment.   If you are experiencing a Mental Health or [place] or need someone to talk to, please call the Suicide and Crisis Lifeline: 988 call the [place] National Suicide Prevention Lifeline: 367-688-2045 or TTY: 8181703343 TTY 813-290-0162) to talk to a trained counselor call 1-800-273-TALK (toll free, 24 hour hotline)   Following is a copy of the CCM Program Consent:  CCM service includes personalized support from designated clinical staff supervised by the physician, including individualized plan of care and coordination with other care providers 24/7 contact phone numbers for assistance for urgent and routine care needs. Service will only be billed when office clinical staff spend 20 minutes or more in a month to coordinate care. Only one practitioner may furnish and bill the service in a calendar month. The patient may stop CCM services at amy time (effective at the end of the month) by phone call to the office staff. The patient will be responsible for cost sharing (co-pay) or up to 20% of the service fee (after annual deductible is met)  Following is a copy of your full provider care plan:   Goals Addressed             This Visit's Progress    CCM Expected Outcome:  Monitor, Self-Manage and Reduce Symptoms of Heart Failure       Current Barriers:  Knowledge Deficits related to the changes that can take place with heart failure and the sx and sx of heart failure Care Coordination needs related to utilization of resources and finding help in the  home in a patient with heart failure Chronic Disease Management support and education needs related to effective management of heart failure Lacks caregiver support.  BP Readings from Last 3 Encounters:  03/06/22 (!) 166/67  11/13/21 (!) 148/57  08/15/21 (!) 187/66     Wt Readings from Last 3 Encounters:  11/26/22 179 lb  9.6 oz (81.5 kg)  09/19/22 179 lb 9.6 oz (81.5 kg)  03/06/22 179 lb 9.6 oz (81.5 kg)   Patients weight is stable Planned Interventions: Basic overview and discussion of pathophysiology of Heart Failure reviewed. Discussed changes in heart health and his blood pressures have been elevated in the evenings. Encouraged the patient to talk to the pharm D about taking one of his medications for blood pressures at night.  Provided education on low sodium diet. Education on monitoring for sodium in diet and not using extra salt in foods. The patient does cook for self some and the patient receives MOW. The patient is mindful of sodium in his diet. The patient states that he is eating and he is watching his sodium. The food is bland, especially from MOW and he only has 3 teeth that he can chew with so he is having to eat softer food.  Reviewed Heart Failure Action Plan in depth. The patient states that in the past when his weight goes up he has taken an extra fluid pill as directed by the provider.  Review of monitoring for +2/3 in one day or +5 in one week and to call the provider for changes or new onset of heart failure sx and sx.  Assessed need for readable accurate scales in home. The patient has a scale and the patient states that he can weigh daily but he does not. Education on the benefits of daily weights in monitoring for exacerbation of heart failure.  Since the last outreach the patient has been weighing consistently. His weight is stable and staying around 179 currently Provided education about placing scale on hard, flat surface Advised patient to weigh each morning after emptying bladder Discussed importance of daily weight and advised patient to weigh and record daily Reviewed role of diuretics in prevention of fluid overload and management of heart failure. The patient states that he does take his Lasix as prescribed and is at the 40 mg dose.  Discussed the importance of keeping all appointments  with provider. The patient states that he sees the heart specialist every 6 months. The patient had a video visit with the pcp on 11-26-2022 and got a refill for his Coreg. Provided patient with education about the role of exercise in the management of heart failure. The patient is limited in his ability to do exercise due to shortness of breath.  Review of elevation of feet and legs when sitting to help with fluid, the patient has swelling in his feet and legs, the patient also uses compression socks. He says when he is sitting he is normally in his recliner and has his feet elevated. Education and support given. The patient is keeping his feet elevated when sitting down. The patient states he is monitoring for extra fluid. He states it is moving up his leg more now. Advised patient to discuss changes in sx and sx of heart failure with provider Screening for signs and symptoms of depression related to chronic disease state  Assessed social determinant of health barriers  Symptom Management: Take medications as prescribed   Attend all scheduled provider appointments Call provider office for new concerns or questions  call the Suicide and Crisis Lifeline: 988 call the [place] National Suicide Prevention Lifeline: 367-195-7520 or TTY: (323)779-4699 TTY 575-688-2734) to talk to a trained counselor call 1-800-273-TALK (toll free, 24 hour hotline) if experiencing a Mental Health or [place]  call office if I gain more than 2 pounds in one day or 5 pounds in one week keep legs up while sitting use salt in moderation watch for swelling in feet, ankles and legs every day weigh myself daily develop a rescue plan follow rescue plan if symptoms flare-up track symptoms and what helps feel better or worse dress right for the weather, hot or cold  Follow Up Plan: Telephone follow up appointment with care management team member scheduled for: 01-11-2023 at 0900 am       CCM:  Maintain,  Monitor and Self-Manage Symptoms of COPD       Current Barriers:  Knowledge Deficits related to smoking with the use of oxygen, safety concerns Care Coordination needs related to physical needs and help in the home and how to utilize resources in a patient with COPD Chronic Disease Management support and education needs related to effective management of COPD Lacks caregiver support.  Medication changes and needs for pharm D support with cost effective medications to treat his COPD effectively  Planned Interventions: Provided patient with basic written and verbal COPD education on self care/management/and exacerbation prevention. The patient feels his breathing is getting worse. He says that today he takes his last dose of symbicort and then will have to go to [facility] and he is afraid this will not work as well for him. Reflective listening and support given. Got the pharm D scheduled appointment moved up to today so the patient can discuss options with the pharm D and get assistance with meeting his medication needs for  effective management of his COPD. Advised patient to track and manage COPD triggers. Review of triggers and how to avoid things that trigger exacerbation. Discussed triggers that could exacerbate his condition. Review of watching for acute changes in weather, smells that could exacerbate his condition, and monitoring others that may have respiratory or contagious illness.  Provided verbal instructions on pursed lip breathing and utilized returned demonstration as teach back. The patient uses oxygen 24/7 at 2.5 liters, has to pace activity. The patient has an issue with his oxygen concentrator but they replaced this and it is working well now.  Provided instruction about proper use of medications used for management of COPD including inhalers. The patient is compliant with medications. Uses oxygen at 2.5 liters via [place]. Discussed safety with the use of oxygen and smoking. The patient states  he is safe and mindful of oxygen use when smoking.  Advised patient to self assesses COPD action plan zone and make appointment with provider if in the yellow zone for 48 hours without improvement. Most recent appointment with the pcp was on 11-26-2022. Discussed getting involved with the pulmonary provider at the [facility] to help with medication and disease management Advised patient to engage in light exercise as tolerated 3-5 days a week to aid in the the management of COPD. Patient limited to the amount of activity he does due to getting short of breath. The patient states that he walks in his apartment and when he goes out he has a power chair he can use if needed. Education on doing leg exercises when sitting to make sure not to loose muscle mass and strength.  Provided education about and advised patient to utilize infection prevention strategies to reduce risk of respiratory infection. Review of high risk of infection with his condition. Discussed the importance of adequate rest and management of fatigue with COPD. The patient tries to get most of his activity done in the am because by afternoon he is really tired. He has help in the home one day a week and he is hopeful to get more help in the home. Education and support given. Screening for signs and symptoms of depression related to chronic disease state  Assessed social determinant of health barriers The patient has changed his [place] appointment and it is now in April. Ask the patient to write down questions to ask the providers at the [place]. Also instructed the patient to have a list available for the pharm D today so he can discuss what may be the best options for him with medications for effective management of COPD. His insurance does not pay for Symbicort.   Symptom Management: Take medications as prescribed   Attend all scheduled provider appointments Call provider office for new concerns or questions  call the Suicide and Crisis Lifeline:  988 call the [place] National Suicide Prevention Lifeline: 548-103-0763 or TTY: 613-325-6976 TTY 609-484-5198) to talk to a trained counselor call 1-800-273-TALK (toll free, 24 hour hotline) if experiencing a Mental Health or [place]  identify and avoid work-related triggers identify and remove indoor air pollutants limit outdoor activity during cold weather listen for public air quality announcements every day do breathing exercises every day develop a rescue plan eliminate symptom triggers at home follow rescue plan if symptoms flare-up  Follow Up Plan: Telephone follow up appointment with care management team member scheduled for: 01-11-2023 at 0900 am          Patient verbalizes understanding of instructions and care plan provided  today and agrees to view in [place]. Active MyChart status and patient understanding of how to access instructions and care plan via MyChart confirmed with patient.     Telephone follow up appointment with care management team member scheduled for: 01-11-2023 at 0900 am

## 2022-12-07 NOTE — Progress Notes (Signed)
12/07/2022 Name: Christopher Armstrong MRN: [id number] DOB: 1941-08-23  No chief complaint on file.   Christopher Armstrong is a 82 y.o. year old male who presented for a telephone visit.   They were referred to the pharmacist by their Case Management Team  for assistance in managing medication access.    Subjective:  Care Team: Primary Care Provider: [name], DO ;  Medication Access/Adherence  Current Pharmacy:  [facility], [place] [id number] [place] [address] Phone: [phone] Fax: [place], [place]. [place]. [place] [address] Phone: [phone] Fax: [place], [place] [address] Phone: [phone] Fax: [phone]   Patient reports affordability concerns with their medications: Yes   COPD:  Current medications: out of symbicort after tonights dose, starting breo ellipta tomorrow due to patient assistance program for symbicort discontinued Breo ~$45 at pharmacy, patient expressed financial constraint   Current medication access support: none at present   Objective:  No results found for: "HGBA1C"  Lab Results  Component Value Date   CREATININE 0.90 05/09/2020   BUN 14 05/09/2020   NA 140 05/09/2020   K 4.0 05/09/2020   CL 102 05/09/2020   CO2 28 05/09/2020    No results found for: "CHOL", "HDL", "[place]", "LDLDIRECT", "TRIG", "CHOLHDL"  Medications Reviewed Today     Reviewed by Vanita Ingles, RN (Case Manager) on 12/07/22 at [place] List Status: <None>   Medication Order Taking? Sig Documenting Provider Last Dose Status Informant  albuterol (VENTOLIN HFA) 108 (90 Base) MCG/ACT inhaler [id number] No Inhale 1-2 puffs into the lungs every 6 (six) hours  as needed for wheezing or shortness of breath. [name], DO Taking Active   AMBULATORY NON [place] [id number] No Motorized wheelchair per patient preference and insurance coverage. Patient suffers from copd and atrial fibrillation and debility d/t recent hospitalization which impairs their ability to perform daily activities like bathing, dressing, grooming, and toileting in the home.  A cane, crutch,walker or manual wheelchair will not resolve issue with performing activities of daily living. A  power wheelchair will allow patient to safely perform daily activities. Patient unable to safely propel the wheelchair and does not have a caregiver who can provide assistance. Length of need Lifetime.  Fax [phone] [name], DO Taking Active   amLODipine (NORVASC) 5 MG tablet [id number] No Take 1 tablet by mouth daily. [provider] Taking Active   apixaban (ELIQUIS) 5 MG TABS tablet [id number] No Take 1 tablet (5 mg total) by mouth 2 (two) times daily.  Patient taking differently: Take 2.5 mg by mouth 2 (two) times daily.   [name], DO Taking Active            Med Note Tonny Bollman Jun 28, 2022  2:08 PM) Obtains through [place]  aspirin EC 81 MG tablet [id number] No Take 1 tablet (81 mg total) by mouth daily. Lelon Perla, MD Taking Active   budesonide-formoterol [facility]) 160-4.5 MCG/ACT inhaler [id number] No Inhale 2 puffs into the lungs daily. [provider] Taking Active   carvedilol (COREG) 12.5 MG tablet [id number]  Take 1 tablet (12.5 mg total) by mouth 2 (two) times daily with a meal.  [name], DO  Active   furosemide (LASIX) 40 MG tablet [id number] No TAKE 1/2 TO 1 (ONE-HALF TO ONE) TABLET BY MOUTH ONCE DAILY [name], DO Taking Active   ipratropium-albuterol (DUONEB) 0.5-2.5 (3) MG/3ML SOLN [id number] No Take 3 mLs by nebulization every 4 (four) hours as needed. [name], DO Taking Active   lisinopril (ZESTRIL) 40 MG  tablet [id number] No Take 1 tablet (40 mg total) by mouth daily. [name], DO Taking Active   magnesium hydroxide (MILK OF MAGNESIA) 400 MG/5ML suspension [id number] No Take 30 mLs by mouth as needed. [provider] Taking Active   nitroGLYCERIN (NITROSTAT) 0.4 MG SL tablet [id number] No Place 1 tablet (0.4 mg total) under the tongue every 5 (five) minutes as needed for chest pain. [name], DO Taking Active   OXYGEN [id number] No Inhale into the lungs daily. 2L [provider] Taking Active            Med Note Carnella [place] Mar 02, 2020 10:30 AM)    Respiratory Therapy Supplies MISC [id number] No Nebulizer tubing [name], DO Taking Active               Assessment/Plan:   COPD: - Currently controlled, but patient is feeling uncertain about breo ellipta, he did not seem to like it in the past.  - Recommend to start for now, provided direct phone line to call me early next week if he has trouble, will coordinate with Christopher Armstrong, insurance formulary for best inhaler plan.  - Recommend best option: breztri using [facility], requires prior auth but otherwise covered after.    Follow Up Plan: 1 week  Larinda Buttery, PharmD Clinical Pharmacist [facility] [phone]

## 2022-12-07 NOTE — Chronic Care Management (AMB) (Signed)
Chronic Care Management   CCM RN Visit Note  12/07/2022 Name: Christopher Armstrong MRN: [id number] DOB: 1940/11/01  Subjective: Christopher Armstrong is a 82 y.o. year old male who is a primary care patient of [name], DO. The patient was referred to the Chronic Care Management team for assistance with care management needs subsequent to provider initiation of CCM services and plan of care.    Today's Visit:  Engaged with patient by telephone for follow up visit.        Goals Addressed             This Visit's Progress    CCM Expected Outcome:  Monitor, Self-Manage and Reduce Symptoms of Heart Failure       Current Barriers:  Knowledge Deficits related to the changes that can take place with heart failure and the sx and sx of heart failure Care Coordination needs related to utilization of resources and finding help in the  home in a patient with heart failure Chronic Disease Management support and education needs related to effective management of heart failure Lacks caregiver support.  BP Readings from Last 3 Encounters:  03/06/22 (!) 166/67  11/13/21 (!) 148/57  08/15/21 (!) 187/66     Wt Readings from Last 3 Encounters:  11/26/22 179 lb 9.6 oz (81.5 kg)  09/19/22 179 lb 9.6 oz (81.5 kg)  03/06/22 179 lb 9.6 oz (81.5 kg)   Patients weight is stable Planned Interventions: Basic overview and discussion of pathophysiology of Heart Failure reviewed. Discussed changes in heart health and his blood pressures have been elevated in the evenings. Encouraged the patient to talk to the pharm D about taking one of his medications for blood pressures at night.  Provided education on low sodium diet. Education on monitoring for sodium in diet and not using extra salt in foods. The patient does cook for self some and the patient receives MOW. The patient is mindful of sodium in his diet. The patient states that he is eating and he is watching his sodium. The food is bland, especially from MOW  and he only has 3 teeth that he can chew with so he is having to eat softer food.  Reviewed Heart Failure Action Plan in depth. The patient states that in the past when his weight goes up he has taken an extra fluid pill as directed by the provider.  Review of monitoring for +2/3 in one day or +5 in one week and to call the provider for changes or new onset of heart failure sx and sx.  Assessed need for readable accurate scales in home. The patient has a scale and the patient states that he can weigh daily but he does not. Education on the benefits of daily weights in monitoring for exacerbation of heart failure. Since the last outreach the patient has been weighing consistently. His weight is stable and staying around 179 currently Provided education about placing scale on hard, flat surface Advised patient to weigh each morning after emptying bladder Discussed importance of daily weight and advised patient to weigh and record daily Reviewed role of diuretics in prevention of fluid overload and management of heart failure. The patient states that he does take his Lasix as prescribed and is at the 40 mg dose.  Discussed the importance of keeping all appointments with provider. The patient states that he sees the heart specialist every 6 months. The patient had a video visit with the pcp on 11-26-2022 and got a refill  for his Coreg. Provided patient with education about the role of exercise in the management of heart failure. The patient is limited in his ability to do exercise due to shortness of breath.  Review of elevation of feet and legs when sitting to help with fluid, the patient has swelling in his feet and legs, the patient also uses compression socks. He says when he is sitting he is normally in his recliner and has his feet elevated. Education and support given. The patient is keeping his feet elevated when sitting down. The patient states he is monitoring for extra fluid. He states it is moving up  his leg more now. Advised patient to discuss changes in sx and sx of heart failure with provider Screening for signs and symptoms of depression related to chronic disease state  Assessed social determinant of health barriers  Symptom Management: Take medications as prescribed   Attend all scheduled provider appointments Call provider office for new concerns or questions  call the Suicide and Crisis Lifeline: 988 call the [place] National Suicide Prevention Lifeline: 801-408-9909 or TTY: (845)207-2400 TTY 571 814 9585) to talk to a trained counselor call 1-800-273-TALK (toll free, 24 hour hotline) if experiencing a Mental Health or [place]  call office if I gain more than 2 pounds in one day or 5 pounds in one week keep legs up while sitting use salt in moderation watch for swelling in feet, ankles and legs every day weigh myself daily develop a rescue plan follow rescue plan if symptoms flare-up track symptoms and what helps feel better or worse dress right for the weather, hot or cold  Follow Up Plan: Telephone follow up appointment with care management team member scheduled for: 01-11-2023 at 0900 am       CCM:  Maintain, Monitor and Self-Manage Symptoms of COPD       Current Barriers:  Knowledge Deficits related to smoking with the use of oxygen, safety concerns Care Coordination needs related to physical needs and help in the home and how to utilize resources in a patient with COPD Chronic Disease Management support and education needs related to effective management of COPD Lacks caregiver support.  Medication changes and needs for pharm D support with cost effective medications to treat his COPD effectively  Planned Interventions: Provided patient with basic written and verbal COPD education on self care/management/and exacerbation prevention. The patient feels his breathing is getting worse. He says that today he takes his last dose of symbicort and then will  have to go to [facility] and he is afraid this will not work as well for him. Reflective listening and support given. Got the pharm D scheduled appointment moved up to today so the patient can discuss options with the pharm D and get assistance with meeting his medication needs for effective management of his COPD. Advised patient to track and manage COPD triggers. Review of triggers and how to avoid things that trigger exacerbation. Discussed triggers that could exacerbate his condition. Review of watching for acute changes in weather, smells that could exacerbate his condition, and monitoring others that may have respiratory or contagious illness.  Provided verbal instructions on pursed lip breathing and utilized returned demonstration as teach back. The patient uses oxygen 24/7 at 2.5 liters, has to pace activity. The patient has an issue with his oxygen concentrator but they replaced this and it is working well now.  Provided instruction about proper use of medications used for management of COPD including inhalers. The patient is  compliant with medications. Uses oxygen at 2.5 liters via [place]. Discussed safety with the use of oxygen and smoking. The patient states he is safe and mindful of oxygen use when smoking.  Advised patient to self assesses COPD action plan zone and make appointment with provider if in the yellow zone for 48 hours without improvement. Most recent appointment with the pcp was on 11-26-2022. Discussed getting involved with the pulmonary provider at the [facility] to help with medication and disease management Advised patient to engage in light exercise as tolerated 3-5 days a week to aid in the the management of COPD. Patient limited to the amount of activity he does due to getting short of breath. The patient states that he walks in his apartment and when he goes out he has a power chair he can use if needed. Education on doing leg exercises when sitting to make sure not to loose muscle mass and  strength.  Provided education about and advised patient to utilize infection prevention strategies to reduce risk of respiratory infection. Review of high risk of infection with his condition. Discussed the importance of adequate rest and management of fatigue with COPD. The patient tries to get most of his activity done in the am because by afternoon he is really tired. He has help in the home one day a week and he is hopeful to get more help in the home. Education and support given. Screening for signs and symptoms of depression related to chronic disease state  Assessed social determinant of health barriers The patient has changed his [place] appointment and it is now in April. Ask the patient to write down questions to ask the providers at the [place]. Also instructed the patient to have a list available for the pharm D today so he can discuss what may be the best options for him with medications for effective management of COPD. His insurance does not pay for Symbicort.   Symptom Management: Take medications as prescribed   Attend all scheduled provider appointments Call provider office for new concerns or questions  call the Suicide and Crisis Lifeline: 988 call the [place] National Suicide Prevention Lifeline: 534-489-7752 or TTY: 732-777-9415 TTY 313-422-6768) to talk to a trained counselor call 1-800-273-TALK (toll free, 24 hour hotline) if experiencing a Mental Health or [place]  identify and avoid work-related triggers identify and remove indoor air pollutants limit outdoor activity during cold weather listen for public air quality announcements every day do breathing exercises every day develop a rescue plan eliminate symptom triggers at home follow rescue plan if symptoms flare-up  Follow Up Plan: Telephone follow up appointment with care management team member scheduled for: 01-11-2023 at 0900 am          Plan:Telephone follow up appointment with care management team  member scheduled for:  01-11-2023 at 0900 am  [place], MSN, CCM RN Care Manager  Chronic Care Management Direct Number: [phone]

## 2022-12-07 NOTE — Progress Notes (Signed)
Error in charting.

## 2022-12-11 ENCOUNTER — Ambulatory Visit: Payer: Self-pay | Admitting: Licensed Clinical Social Worker

## 2022-12-11 ENCOUNTER — Telehealth: Payer: Self-pay | Admitting: Pharmacist

## 2022-12-11 DIAGNOSIS — J449 Chronic obstructive pulmonary disease, unspecified: Secondary | ICD-10-CM | POA: Diagnosis not present

## 2022-12-11 DIAGNOSIS — J42 Unspecified chronic bronchitis: Secondary | ICD-10-CM | POA: Diagnosis not present

## 2022-12-11 MED ORDER — BREZTRI AEROSPHERE 160-9-4.8 MCG/ACT IN AERO: 2.0000 | INHALATION_SPRAY | Freq: Two times a day (BID) | RESPIRATORY_TRACT | 11 refills | Status: DC

## 2022-12-11 NOTE — Addendum Note (Signed)
Addended by: Darius Bump on: 12/11/2022 02:53 PM   Modules accepted: Orders

## 2022-12-11 NOTE — Patient Outreach (Signed)
  Care Coordination   Follow Up Visit Note   12/11/2022 Name: Christopher Armstrong MRN: [id number] DOB: 1941/09/03  Christopher Armstrong is a 82 y.o. year old male who sees Christopher Nutting, DO for primary care. I spoke with  Christopher Armstrong by phone today.  What matters to the patients health and wellness today?  Transportation Assistance    Goals Addressed             This [place] Aid   On track    Care Coordination Interventions: Solution-Focused Strategies employed:  Active listening / Reflection utilized  Emotional Support Provided Verbalization of feelings encouraged  Pt is having difficulty managing SOB after initiating Breo four days ago. States "it does not last" resulting in increased of emergency inhaler LCSW reviewed upcoming appts with pt. He has agreed to address concerns with Christopher Armstrong at [place] appt LCSW will provide update to CCM RN and [place] LCSW reminded pt of need to utilize [place] for transportation to [place] appts in April. He plans to ask family/friend for transportation assistance             SDOH assessments and interventions completed:  No     Care Coordination Interventions:  Yes, provided   Follow up plan: Follow up call scheduled for 01/15/23    Encounter Outcome:  Pt. Visit Completed   Christopher Armstrong, MSW, [email or web address] Phone [phone] 12:41 PM

## 2022-12-11 NOTE — Patient Instructions (Signed)
Visit Information  Thank you for taking time to visit with me today. Please don't hesitate to contact me if I can be of assistance to you.   Following are the goals we discussed today:   Goals Addressed             This Visit's Progress    Obtain Personal Care Aid   On track    Care Coordination Interventions: Solution-Focused Strategies employed:  Active listening / Reflection utilized  Emotional Support Provided Verbalization of feelings encouraged  Pt is having difficulty managing SOB after initiating Breo four days ago. States "it does not last" resulting in increased of emergency inhaler LCSW reviewed upcoming appts with pt. He has agreed to address concerns with Larinda Buttery at [place] appt LCSW will provide update to CCM RN and [place] LCSW reminded pt of need to utilize [place] for transportation to [place] appts in April. He plans to ask family/friend for transportation assistance             Our next appointment is by telephone on 01/15/23 at 11:30 AM  Please call the care guide team at [phone] if you need to cancel or reschedule your appointment.   If you are experiencing a Mental Health or [place] or need someone to talk to, please call the Suicide and Crisis Lifeline: 988 call 911   The patient verbalized understanding of instructions, educational materials, and care plan provided today and DECLINED offer to receive copy of patient instructions, educational materials, and care plan.   [name], MSW, [email or web address] Phone [phone] 12:42 PM

## 2022-12-11 NOTE — Progress Notes (Signed)
Care Coordination Call   Received message from [facility] LCSW regarding patient expressing increased breakthrough symptoms since starting Breo ellipta. Collaborated with PCP and [facility] [place] to [place].   Sent RX to [employer] electronically, it is formulary with prior auth though [place] system. Initiated this process and will keep scheduled outreach with patient tomorrow to discuss, and obtain status of this prescription.  Larinda Buttery, PharmD Clinical Pharmacist [facility] At [facility] [phone]

## 2022-12-12 ENCOUNTER — Other Ambulatory Visit: Payer: Medicare (Managed Care) | Admitting: Pharmacist

## 2022-12-12 NOTE — Progress Notes (Signed)
12/12/2022 Name: Christopher Armstrong MRN: [id number] DOB: Oct 07, 1941  Chief Complaint  Patient presents with   Medication Assistance    Christopher Armstrong is a 82 y.o. year old male who presented for a telephone visit.   They were referred to the pharmacist by their Case Management Team  for assistance in managing medication access.    Subjective:  Care Team: Primary Care Provider: [name], DO ;  Medication Access/Adherence  Current Pharmacy:  [facility], [place] [id number] [place] [address] Phone: [phone] Fax: [place], [place] [place] [place] [address] Phone: [phone] Fax: [place], [place] - [place] [address] [place] [address] Phone: [phone] Fax: [place], [place] - [address] [place] [address] Phone: [phone] Fax: [phone]   Patient reports affordability concerns with their medications: Yes   COPD:  Current medications: out of symbicort and patient assistance program has ended. Started breo ellipta, patient does report increase in symptoms and need to use rescue inhaler more frequently. Breo ~$45 at pharmacy, patient expressed financial constraint  Investigated breztri - sent RX on 2/27 to [place] (for better cost). Today patient describes it will be $11/month through [place] for 90DS, but [place] pharmacy is requesting RX to come through his [place] provider, Delorise Shiner.   Current medication access support: none at present   Objective:  No results found for: "HGBA1C"  Lab Results  Component Value Date   CREATININE 0.90 05/09/2020   BUN 14 05/09/2020    NA 140 05/09/2020   K 4.0 05/09/2020   CL 102 05/09/2020   CO2 28 05/09/2020    No results found for: "CHOL", "HDL", "LDLCALC", "LDLDIRECT", "TRIG", "CHOLHDL"  Medications Reviewed Today     Reviewed by Darius Bump, [place] (Pharmacist) on 12/07/22 at [place] List Status: <None>   Medication Order Taking? Sig Documenting Provider Last Dose Status Informant  albuterol (VENTOLIN HFA) 108 (90 Base) MCG/ACT inhaler [id number] Yes Inhale 1-2 puffs into the lungs every 6 (six) hours as needed for wheezing or shortness of breath. [name], DO Taking Active   AMBULATORY NON [employer] [id number] Yes Motorized wheelchair per patient preference and insurance coverage. Patient suffers from copd and atrial fibrillation and debility d/t recent hospitalization which impairs their ability to perform daily activities like bathing, dressing, grooming, and toileting in the home.  A cane, crutch,walker or manual wheelchair will not resolve issue with performing activities of daily living. A  power wheelchair will allow patient to safely perform daily activities. Patient unable to safely propel the wheelchair and does not have a caregiver who can provide assistance. Length of need Lifetime.  Fax [phone] [name], DO Taking Active   amLODipine (NORVASC) 5 MG tablet [id number] Yes Take 1 tablet by mouth daily. [provider] Taking Active   apixaban (ELIQUIS) 5 MG TABS tablet [id number] Yes Take 1 tablet (5 mg total) by mouth 2 (two) times daily.  Patient taking differently: Take 2.5 mg by mouth 2 (two) times daily.   [name], DO Taking Active            Med Note Jens Som, Felecia Shelling   Thu  Jun 28, 2022  2:08 PM) Obtains through [place]  aspirin EC 81 MG tablet [id number] Yes Take 1 tablet (81 mg total) by mouth daily. Lelon Perla, MD Taking Active   carvedilol (COREG) 12.5 MG tablet [id number] Yes Take 1 tablet (12.5 mg total) by mouth 2 (two) times daily with a meal. [name], DO Taking Active   fluticasone furoate-vilanterol (BREO ELLIPTA) 100-25 MCG/ACT AEPB [id number] Yes Inhale 1 puff into the lungs daily. [provider] Taking Active   furosemide (LASIX) 40 MG tablet [id number] Yes TAKE 1/2 TO 1 (ONE-HALF TO ONE) TABLET BY MOUTH ONCE DAILY [name], DO Taking Active            Med Note Darius Bump   Fri Dec 07, 2022  2:59 PM) One tablet daily as of 12/07/22 review  ipratropium-albuterol (DUONEB) 0.5-2.5 (3) MG/3ML SOLN [id number] Yes Take 3 mLs by nebulization every 4 (four) hours as needed. [name], DO Taking Active   lisinopril (ZESTRIL) 40 MG tablet [id number] Yes Take 1 tablet (40 mg total) by mouth daily. [name], DO Taking Active   magnesium hydroxide (MILK OF MAGNESIA) 400 MG/5ML suspension [id number] Yes Take 30 mLs by mouth as needed. [provider] Taking Active   nitroGLYCERIN (NITROSTAT) 0.4 MG SL tablet [id number] Yes Place 1 tablet (0.4 mg total) under the tongue every 5 (five) minutes as needed for chest pain. [name], DO Taking Active   OXYGEN [id number] Yes Inhale into the lungs daily. 2L [provider] Taking Active            Med Note Carnella [place] Mar 02, 2020 10:30 AM)    Respiratory Therapy Supplies MISC [id number] Yes Nebulizer tubing [name], DO Taking Active               Assessment/Plan:   COPD: - Currently uncontrolled, attempting to facilitate Breztri RX to obtain through [place] system to help with cost burden. - Attempted contact with [facility] provider Delorise Shiner, phone tree led to nurse who took message, states would return my call. Awaiting callback to continue to facilitate RX. Also advised patient to contact his VA to assist with this coordination.   Follow Up Plan: tomorrow to touchbase with patient regarding updates in plan.   Larinda Buttery, PharmD Clinical Pharmacist [facility] At [facility] [phone]

## 2022-12-13 ENCOUNTER — Other Ambulatory Visit: Payer: Medicare (Managed Care) | Admitting: Pharmacist

## 2022-12-13 ENCOUNTER — Telehealth: Payer: Self-pay | Admitting: Pharmacist

## 2022-12-13 DIAGNOSIS — J4489 Other specified chronic obstructive pulmonary disease: Secondary | ICD-10-CM

## 2022-12-13 DIAGNOSIS — I5032 Chronic diastolic (congestive) heart failure: Secondary | ICD-10-CM

## 2022-12-13 NOTE — Progress Notes (Signed)
12/13/2022 Name: Christopher Armstrong MRN: [id number] DOB: 10-Aug-1941  No chief complaint on file.   Christopher Armstrong is a 82 y.o. year old male who presented for a telephone visit.   They were referred to the pharmacist by their Case Management Team  for assistance in managing medication access.    Subjective:  Care Team: Primary Care Provider: [name], DO ;  Medication Access/Adherence  Current Pharmacy:  [facility], [place] [id number] [place] [address] Phone: [phone] Fax: [place], [place]. [place]. [place] [address] Phone: [phone] Fax: [place], [place] - [address] [place] [address] Phone: [phone] Fax: [place], [place] - [address] [place] [address] Phone: [phone] Fax: [phone]   Patient reports affordability concerns with their medications: Yes   COPD:  Current medications: out of symbicort and patient assistance program has ended. Started breo ellipta, patient does report increase in symptoms and need to use rescue inhaler more frequently. Breo ~$45 at pharmacy, patient expressed financial constraint. Attempting to coordinate [place] through [place], but may have to be prescribed through [employer] [employer] [employer] [facility].   Breztri not covered by patient insurance to fill at [facility].  Patient likely does not have PIF to be successful with a dry powder inhaler.   Current medication access support: none at present   Objective:  No results found for: "HGBA1C"  Lab Results  Component Value Date   CREATININE 0.90 05/09/2020   BUN 14 05/09/2020   NA 140  05/09/2020   K 4.0 05/09/2020   CL 102 05/09/2020   CO2 28 05/09/2020    No results found for: "CHOL", "HDL", "LDLCALC", "LDLDIRECT", "TRIG", "CHOLHDL"  Medications Reviewed Today     Reviewed by Darius Bump, [place] (Pharmacist) on 12/07/22 at [place] List Status: <None>   Medication Order Taking? Sig Documenting Provider Last Dose Status Informant  albuterol (VENTOLIN HFA) 108 (90 Base) MCG/ACT inhaler [id number] Yes Inhale 1-2 puffs into the lungs every 6 (six) hours as needed for wheezing or shortness of breath. [name], DO Taking Active   AMBULATORY NON [place] [id number] Yes Motorized wheelchair per patient preference and insurance coverage. Patient suffers from copd and atrial fibrillation and debility d/t recent hospitalization which impairs their ability to perform daily activities like bathing, dressing, grooming, and toileting in the home.  A cane, crutch,walker or manual wheelchair will not resolve issue with performing activities of daily living. A  power wheelchair will allow patient to safely perform daily activities. Patient unable to safely propel the wheelchair and does not have a caregiver who can provide assistance. Length of need Lifetime.  Fax [phone] [name], DO Taking Active   amLODipine (NORVASC) 5 MG tablet [id number] Yes Take 1 tablet by mouth daily. [provider] Taking Active   apixaban (ELIQUIS) 5 MG TABS tablet [id number] Yes Take 1 tablet (5 mg total) by mouth 2 (two) times daily.  Patient taking differently: Take 2.5 mg by mouth 2 (two) times daily.   [name], DO Taking Active            Med Note Jens Som, Isaias Sakai Jun 28, 2022  2:08 PM) Obtains through [place]  aspirin EC 81 MG tablet [id number] Yes Take 1 tablet (81 mg total) by mouth daily. Lelon Perla, MD Taking Active   carvedilol (COREG) 12.5 MG tablet [id number] Yes Take 1 tablet (12.5 mg total) by mouth 2 (two) times daily with a meal. [name], DO  Taking Active   fluticasone furoate-vilanterol (BREO ELLIPTA) 100-25 MCG/ACT AEPB [id number] Yes Inhale 1 puff into the lungs daily. [provider] Taking Active   furosemide (LASIX) 40 MG tablet [id number] Yes TAKE 1/2 TO 1 (ONE-HALF TO ONE) TABLET BY MOUTH ONCE DAILY [name], DO Taking Active            Med Note Darius Bump   Fri Dec 07, 2022  2:59 PM) One tablet daily as of 12/07/22 review  ipratropium-albuterol (DUONEB) 0.5-2.5 (3) MG/3ML SOLN [id number] Yes Take 3 mLs by nebulization every 4 (four) hours as needed. [name], DO Taking Active   lisinopril (ZESTRIL) 40 MG tablet [id number] Yes Take 1 tablet (40 mg total) by mouth daily. [name], DO Taking Active   magnesium hydroxide (MILK OF MAGNESIA) 400 MG/5ML suspension [id number] Yes Take 30 mLs by mouth as needed. [provider] Taking Active   nitroGLYCERIN (NITROSTAT) 0.4 MG SL tablet [id number] Yes Place 1 tablet (0.4 mg total) under the tongue every 5 (five) minutes as needed for chest pain. [name], DO Taking Active   OXYGEN [id number] Yes Inhale into the lungs daily. 2L [provider] Taking Active            Med Note Carnella [place] Mar 02, 2020 10:30 AM)    Respiratory Therapy Supplies MISC [id number] Yes Nebulizer tubing [name], DO Taking Active               Assessment/Plan:   COPD: - Currently uncontrolled, attempting to facilitate Breztri RX to obtain through [place] system to help with cost burden. - Will await callback and further collaboration with [facility] - If unsuccessful, in meantime will coordinate [place] patient assistance through centralized RX med assistance team - May have to provide 1 month supply of advair ([place]) at [place] [facility] if above cannot be completed in a timely manner    Follow Up Plan: 2 weeks at patients request  Larinda Buttery, PharmD Clinical Pharmacist [place] [phone]

## 2022-12-13 NOTE — Progress Notes (Signed)
Care Coordination Call   Received callback from [facility], nurse from Dr. Larina Bras office. She is passing along message to Dr. Alferd Patee about Christopher Armstrong, as well as provider notes for supporting documentation.  She states will callback with updates or next steps. She is unsure if Christopher Armstrong can be prescribed through Dr. Zigmund Daniel or Dr. Alferd Patee, or if it must be through establishing with pulmonologist at [facility]. Expressed to RN that patient was hestitant and suspected this may be the case, as he is symptomatic and mentioned concerns with waiting for access to appointment.    Planned to outreach patient this afternoon - will monitor for any updates.  Larinda Buttery, PharmD Clinical Pharmacist [facility] Primary Care At [facility] [phone]

## 2022-12-18 ENCOUNTER — Telehealth: Payer: Self-pay | Admitting: Pharmacist

## 2022-12-18 NOTE — Progress Notes (Signed)
Care Coordination Call   Returned call to [facility] in response to a voicemail left by RN from Dr. Larina Bras office (patient's [place] provider). Nurse states Judithann Sauger is denied by [facility], and asked for return call for more details.  Called into phone tree and had to leave message for RN to callback.  In meantime, coordinating with AZ&Me representative to possibly request Breztri via sample, and then begin process for [place] patient assistance.  Will update patient as plans progress and will monitor for return call from [place].  Larinda Buttery, PharmD Clinical Pharmacist [facility] Primary Care At [facility] [phone]

## 2022-12-19 ENCOUNTER — Telehealth: Payer: Self-pay

## 2022-12-19 ENCOUNTER — Other Ambulatory Visit (HOSPITAL_COMMUNITY): Payer: Self-pay

## 2022-12-19 NOTE — Telephone Encounter (Signed)
Mailed az&me application to patients home (for breztri).

## 2022-12-20 ENCOUNTER — Other Ambulatory Visit: Payer: Medicare (Managed Care) | Admitting: Pharmacist

## 2022-12-20 ENCOUNTER — Other Ambulatory Visit: Payer: Self-pay | Admitting: Family Medicine

## 2022-12-20 MED ORDER — PREDNISONE 50 MG PO TABS: ORAL_TABLET | ORAL | 0 refills | Status: DC

## 2022-12-20 MED ORDER — FLUTICASONE-SALMETEROL 115-21 MCG/ACT IN AERO: 2.0000 | INHALATION_SPRAY | Freq: Two times a day (BID) | RESPIRATORY_TRACT | 12 refills | Status: DC

## 2022-12-20 NOTE — Progress Notes (Signed)
12/20/2022 Name: Christopher Armstrong MRN: [id number] DOB: [birth year]  No chief complaint on file.   Christopher Armstrong is a 82 y.o. year old male who presented for a telephone visit. Patient contacted me today to report he is experiencing significant difficulty breathing, requesting help asap to switch to an alternative inhaler.   They were referred to the pharmacist by their Case Management Team  for assistance in managing medication access.    Subjective:  Care Team: Primary Care Provider: [name], DO ;  Medication Access/Adherence  Current Pharmacy:  [facility], [place] [id number] [place] [address] Phone: [phone] Fax: [place], [place]. [place]. [place] [address] Phone: [phone] Fax: [place], [place] - [place] [address] Phone: [phone] Fax: [place], [place] - [place] [address] [place] [address] Phone: [phone] Fax: [phone]   Patient reports affordability concerns with their medications: Yes   COPD:  Current medications: out of symbicort and patient assistance program has ended. Started breo ellipta, patient does report increase in symptoms and need to use rescue inhaler & nebulizers more frequently. Breo ~$45 at pharmacy, patient expressed financial constraint. Attempted to coordinate [place] through [place], but requires pulmonologist referral which patient is not interested, due to difficulty ambulating to appointments and long wait times to access specialist care.  Breztri not covered by patient insurance to fill at [facility].  Patient likely does not have PIF to  be successful with a dry powder inhaler such as trelegy ellipta (which is insurance formulary).  Current medication access support: none at present   Objective:  No results found for: "HGBA1C"  Lab Results  Component Value Date   CREATININE 0.90 05/09/2020   BUN 14 05/09/2020   NA 140 05/09/2020   K 4.0 05/09/2020   CL 102 05/09/2020   CO2 28 05/09/2020     Medications Reviewed Today     Reviewed by Darius Bump, [place] (Pharmacist) on 12/07/22 at [place] List Status: <None>   Medication Order Taking? Sig Documenting Provider Last Dose Status Informant  albuterol (VENTOLIN HFA) 108 (90 Base) MCG/ACT inhaler [id number] Yes Inhale 1-2 puffs into the lungs every 6 (six) hours as needed for wheezing or shortness of breath. [name], DO Taking Active   AMBULATORY NON [place] [id number] Yes Motorized wheelchair per patient preference and insurance coverage. Patient suffers from copd and atrial fibrillation and debility d/t recent hospitalization which impairs their ability to perform daily activities like bathing, dressing, grooming, and toileting in the home.  A cane, crutch,walker or manual wheelchair will not resolve issue with performing activities of daily living. A  power wheelchair will allow patient to safely perform daily activities. Patient unable to safely propel the wheelchair and does not have a caregiver who can provide assistance. Length of need Lifetime.  Fax [phone] [name], DO Taking Active   amLODipine (NORVASC) 5 MG tablet [id number] Yes Take 1 tablet by mouth daily. [provider] Taking Active   apixaban (ELIQUIS) 5 MG TABS tablet [id number] Yes Take 1 tablet (5 mg total) by mouth 2 (two) times daily.  Patient taking differently: Take 2.5 mg by mouth 2 (  two) times daily.   [name], DO Taking Active            Med Note Tonny Bollman Jun 28, 2022  2:08 PM) Obtains through [place]  aspirin EC 81 MG tablet [id number] Yes  Take 1 tablet (81 mg total) by mouth daily. Lelon Perla, MD Taking Active   carvedilol (COREG) 12.5 MG tablet [id number] Yes Take 1 tablet (12.5 mg total) by mouth 2 (two) times daily with a meal. [name], DO Taking Active   fluticasone furoate-vilanterol (BREO ELLIPTA) 100-25 MCG/ACT AEPB [id number] Yes Inhale 1 puff into the lungs daily. [provider] Taking Active   furosemide (LASIX) 40 MG tablet [id number] Yes TAKE 1/2 TO 1 (ONE-HALF TO ONE) TABLET BY MOUTH ONCE DAILY [name], DO Taking Active            Med Note Darius Bump   Fri Dec 07, 2022  2:59 PM) One tablet daily as of 12/07/22 review  ipratropium-albuterol (DUONEB) 0.5-2.5 (3) MG/3ML SOLN [id number] Yes Take 3 mLs by nebulization every 4 (four) hours as needed. [name], DO Taking Active   lisinopril (ZESTRIL) 40 MG tablet [id number] Yes Take 1 tablet (40 mg total) by mouth daily. [name], DO Taking Active   magnesium hydroxide (MILK OF MAGNESIA) 400 MG/5ML suspension [id number] Yes Take 30 mLs by mouth as needed. [provider] Taking Active   nitroGLYCERIN (NITROSTAT) 0.4 MG SL tablet [id number] Yes Place 1 tablet (0.4 mg total) under the tongue every 5 (five) minutes as needed for chest pain. [name], DO Taking Active   OXYGEN [id number] Yes Inhale into the lungs daily. 2L [provider] Taking Active            Med Note Carnella Guadalajara Mar 02, 2020 10:30 AM)    Respiratory Therapy Supplies MISC [id number] Yes Nebulizer tubing [name], DO Taking Active               Assessment/Plan:   COPD: - Currently uncontrolled, but coordinated the following plans for sooner action: - Judithann Sauger is approved through manufacturer patient assistance as of 12/20/22 through AZ&Me online application. Will ship to patient's home in ~ 2weeks - Facilitated samples of Breztri with [place] rep, but this will also take 3-4 business days - In meantime, facilitated with [facility] PCP for advair HFA and prednisone course sent to [facility] to keep patient stable and hopefully avoid the need for emergency room or urgent care. RX sent by PCP as of 12/20/22. Called patient back to explain the plan - he verbalized understanding and his helper will go to [facility] to pick up these medications tonight.   Follow Up Plan: patient to callback tomorrow to confirm receipt of medications and touchbase with any questions/concerns  Larinda Buttery, PharmD Clinical Pharmacist [facility] Primary Care At [facility] [phone]

## 2022-12-21 ENCOUNTER — Other Ambulatory Visit: Payer: Self-pay | Admitting: Pharmacist

## 2022-12-21 NOTE — Progress Notes (Signed)
Care Coordination Call   Patient called to notify that his helper, Christopher Armstrong, will be coming this afternoon to pick up his advair HFA inhaler and prednisone at his [place]. Counseled patient on new medication. He states it will be $45 for the inhaler and less than a dollar for his prednisone course. He is having difficulty with a leakage in his oxygen tank/lines, customer service from the oxygen company will be coming out this afternoon to help him resolve this.  Patient should receive Breztri in [place] shipped to his home, counseled patient that if he wishes to begin when the medication arrives - to stop advair HFA and start Breztri.   Patient verbalized understanding.   Will follow up with patient in ~3 weeks to assess medication changes, patient knows to contact if needing sooner assistance.  Larinda Buttery, PharmD Clinical Pharmacist [facility] Primary Care At [facility] [phone]

## 2022-12-28 ENCOUNTER — Other Ambulatory Visit: Payer: Medicare (Managed Care) | Admitting: Pharmacist

## 2022-12-28 ENCOUNTER — Telehealth: Payer: Medicare (Managed Care)

## 2023-01-03 DIAGNOSIS — J449 Chronic obstructive pulmonary disease, unspecified: Secondary | ICD-10-CM | POA: Diagnosis not present

## 2023-01-03 DIAGNOSIS — J42 Unspecified chronic bronchitis: Secondary | ICD-10-CM | POA: Diagnosis not present

## 2023-01-04 ENCOUNTER — Ambulatory Visit: Payer: Medicare (Managed Care) | Admitting: Podiatry

## 2023-01-08 NOTE — Telephone Encounter (Signed)
Submitted application for BREZTRI to AZ&ME for patient assistance.   Phone: [phone]  

## 2023-01-09 ENCOUNTER — Other Ambulatory Visit: Payer: Medicare (Managed Care) | Admitting: Pharmacist

## 2023-01-09 NOTE — Progress Notes (Signed)
01/09/2023 Name: Christopher Armstrong MRN: [id number] DOB: 09-Apr-1941  Chief Complaint  Patient presents with   Medication Assistance    Christopher Armstrong is a 82 y.o. year old male who presented for a telephone visit.    They were referred to the pharmacist by their Case Management Team  for assistance in managing medication access.    Subjective:  Care Team: Primary Care Provider: [name], DO ;  Medication Access/Adherence  Current Pharmacy:  [place], [place] [id number] [place] [address] Phone: [phone] Fax: [place], [place]. [place] [address] Phone: [phone] Fax: [place], [place] [address] Phone: [phone] Fax: [place], [place] - [place] [address] [place] [address] Phone: [phone] Fax: [phone]   Patient reports affordability concerns with their medications: Yes   COPD:  Current medications: out of symbicort and patient assistance program has ended. Started breo ellipta, patient does report increase in symptoms and need to use rescue inhaler & nebulizers more frequently. Breo ~$45 at pharmacy, patient expressed financial constraint. Attempted to coordinate [place] through [place], but requires pulmonologist referral which patient is not interested, due to difficulty ambulating to appointments and long wait times to access specialist care.  Breztri not covered by patient insurance to fill at [facility].  Patient likely does not have PIF to be successful with a dry powder inhaler such as trelegy ellipta (which is insurance formulary).  Current  medication access support: none at present   Objective:  No results found for: "HGBA1C"  Lab Results  Component Value Date   CREATININE 0.90 05/09/2020   BUN 14 05/09/2020   NA 140 05/09/2020   K 4.0 05/09/2020   CL 102 05/09/2020   CO2 28 05/09/2020     Medications Reviewed Today     Reviewed by Darius Bump, [place] (Pharmacist) on 01/09/23 at 1408  Med List Status: <None>   Medication Order Taking? Sig Documenting Provider Last Dose Status Informant  albuterol (VENTOLIN HFA) 108 (90 Base) MCG/ACT inhaler [id number] No Inhale 1-2 puffs into the lungs every 6 (six) hours as needed for wheezing or shortness of breath. [name], DO Taking Active   AMBULATORY NON [place] [id number] No Motorized wheelchair per patient preference and insurance coverage. Patient suffers from copd and atrial fibrillation and debility d/t recent hospitalization which impairs their ability to perform daily activities like bathing, dressing, grooming, and toileting in the home.  A cane, crutch,walker or manual wheelchair will not resolve issue with performing activities of daily living. A  power wheelchair will allow patient to safely perform daily activities. Patient unable to safely propel the wheelchair and does not have a caregiver who can provide assistance. Length of need Lifetime.  Fax [phone] [name], DO Taking Active   amLODipine (NORVASC) 5 MG tablet [id number] No Take 1 tablet by mouth daily. [provider] Taking Active   apixaban (ELIQUIS) 5 MG TABS tablet [id number] No Take 1 tablet (5 mg total) by mouth 2 (two) times daily.  Patient taking differently: Take 2.5 mg by mouth 2 (two) times daily.   [name], DO Taking Active  Med Note Tonny Bollman Jun 28, 2022  2:08 PM) Obtains through [place]  aspirin EC 81 MG tablet [id number] No Take 1 tablet (81 mg total) by mouth daily. Lelon Perla, MD Taking Active   Budeson-Glycopyrrol-Formoterol  (BREZTRI AEROSPHERE) 160-9-4.8 MCG/ACT Hollie Salk [id number]  Inhale 2 puffs into the lungs 2 (two) times daily. [name], DO  Active   carvedilol (COREG) 12.5 MG tablet [id number] No Take 1 tablet (12.5 mg total) by mouth 2 (two) times daily with a meal. [name], DO Taking Active   fluticasone-salmeterol (ADVAIR HFA) 115-21 MCG/ACT inhaler [id number]  Inhale 2 puffs into the lungs 2 (two) times daily. [name], DO  Active   furosemide (LASIX) 40 MG tablet [id number] No TAKE 1/2 TO 1 (ONE-HALF TO ONE) TABLET BY MOUTH ONCE DAILY [name], DO Taking Active            Med Note Darius Bump   Fri Dec 07, 2022  2:59 PM) One tablet daily as of 12/07/22 review  ipratropium-albuterol (DUONEB) 0.5-2.5 (3) MG/3ML SOLN [id number] No Take 3 mLs by nebulization every 4 (four) hours as needed. [name], DO Taking Active   lisinopril (ZESTRIL) 40 MG tablet [id number] No Take 1 tablet (40 mg total) by mouth daily. [name], DO Taking Active   magnesium hydroxide (MILK OF MAGNESIA) 400 MG/5ML suspension [id number] No Take 30 mLs by mouth as needed. [provider] Taking Active   nitroGLYCERIN (NITROSTAT) 0.4 MG SL tablet [id number] No Place 1 tablet (0.4 mg total) under the tongue every 5 (five) minutes as needed for chest pain. [name], DO Taking Active   OXYGEN [id number] No Inhale into the lungs daily. 2L [provider] Taking Active            Med Note Carnella Gu[place] Mar 02, 2020 10:30 AM)    predniSONE (DELTASONE) 50 MG tablet [id number]  Take 50mg  daily x5 days [name], DO  Active   Respiratory Therapy Supplies MISC [id number] No Nebulizer tubing [name], DO Taking Active               Assessment/Plan:   COPD: - Currently uncontrolled, but coordinated the following plans for sooner action: - Judithann Sauger is approved through manufacturer patient assistance as of 12/20/22 through AZ&Me online application. Prescription was sent to  [facility] 01/09/23, expect delivery to patients home in ~10-14 business days, so April 10-17. - In meantime, facilitated sample to be available at PCP office for patient to pick up. Contacted patient back, and left VM of these updates after coordination with [place]&Me as well as for providing sample.   Follow Up Plan: 1-2 weeks  Larinda Buttery, PharmD Clinical Pharmacist [facility] Primary Care At [facility] [phone]

## 2023-01-10 ENCOUNTER — Ambulatory Visit: Payer: Medicare (Managed Care) | Admitting: Podiatry

## 2023-01-11 ENCOUNTER — Other Ambulatory Visit: Payer: Medicare (Managed Care) | Admitting: Pharmacist

## 2023-01-11 ENCOUNTER — Telehealth: Payer: Medicare (Managed Care)

## 2023-01-11 ENCOUNTER — Ambulatory Visit (INDEPENDENT_AMBULATORY_CARE_PROVIDER_SITE_OTHER): Payer: Medicare (Managed Care)

## 2023-01-11 ENCOUNTER — Telehealth: Payer: Self-pay

## 2023-01-11 DIAGNOSIS — J4489 Other specified chronic obstructive pulmonary disease: Secondary | ICD-10-CM

## 2023-01-11 DIAGNOSIS — I5032 Chronic diastolic (congestive) heart failure: Secondary | ICD-10-CM

## 2023-01-11 NOTE — Patient Instructions (Addendum)
Please call the care guide team at [phone] if you need to cancel or reschedule your appointment.   If you are experiencing a Mental Health or [place] or need someone to talk to, please call the Suicide and Crisis Lifeline: 988 call the [place] National Suicide Prevention Lifeline: 587-378-5815 or TTY: 458-384-8499 TTY 684-046-3262) to talk to a trained counselor call 1-800-273-TALK (toll free, 24 hour hotline) go to [facility] [place] [phone])   Following is a copy of the CCM Program Consent:  CCM service includes personalized support from designated clinical staff supervised by the physician, including individualized plan of care and coordination with other care providers 24/7 contact phone numbers for assistance for urgent and routine care needs. Service will only be billed when office clinical staff spend 20 minutes or more in a month to coordinate care. Only one practitioner may furnish and bill the service in a calendar month. The patient may stop CCM services at amy time (effective at the end of the month) by phone call to the office staff. The patient will be responsible for cost sharing (co-pay) or up to 20% of the service fee (after annual deductible is met)  Following is a copy of your full provider care plan:   Goals Addressed             This Visit's Progress    CCM Expected Outcome:  Monitor, Self-Manage and Reduce Symptoms of Heart Failure       Current Barriers:  Knowledge Deficits related to the changes that can take place with heart failure and the sx and sx of heart failure Care Coordination needs related to utilization of resources and finding help in the  home in a patient with heart failure Chronic Disease Management support and education needs related to effective management of heart failure Lacks caregiver support.     Wt Readings from Last 3 Encounters:  11/26/22 179 lb 9.6 oz  (81.5 kg)  09/19/22 179 lb 9.6 oz (81.5 kg)  03/06/22 179 lb 9.6 oz (81.5 kg)   Patients weight is stable Planned Interventions: Basic overview and discussion of pathophysiology of Heart Failure reviewed. Discussed changes in heart health and his blood pressures have been elevated in the evenings. Encouraged the patient to talk to the pharm D about taking one of his medications for blood pressures at night.  Provided education on low sodium diet. Education on monitoring for sodium in diet and not using extra salt in foods. The patient does cook for self some and the patient receives MOW. The patient is mindful of sodium in his diet. The patient states that he is eating and he is watching his sodium. The food is bland, especially from MOW and he only has 3 teeth that he can chew with so he is having to eat softer food.  Reviewed Heart Failure Action Plan in depth. The patient states that in the past when his weight goes up he has taken an extra fluid pill as directed by the provider.  Review of monitoring for +2/3 in one day or +5 in one week and to call the provider for changes or new onset of heart failure sx and sx. Education and support given. The patient denies any new changes in his heart failure. Will follow up with the cardiologist in April at the [place].  Assessed need for readable accurate scales in home. The patient has a scale and the patient states that he can weigh  daily but he does not. Education on the benefits of daily weights in monitoring for exacerbation of heart failure. Since the last outreach the patient has been weighing consistently. His weight is stable and staying around 179 currently Provided education about placing scale on hard, flat surface Advised patient to weigh each morning after emptying bladder Discussed importance of daily weight and advised patient to weigh and record daily Reviewed role of diuretics in prevention of fluid overload and management of heart failure. The  patient states that he does take his Lasix as prescribed and is at the 40 mg dose.  Discussed the importance of keeping all appointments with provider. The patient states that he sees the heart specialist every 6 months. The patient had a video visit with the pcp on 11-26-2022 and got a refill for his Coreg. Provided patient with education about the role of exercise in the management of heart failure. The patient is limited in his ability to do exercise due to shortness of breath.  Review of elevation of feet and legs when sitting to help with fluid, the patient has swelling in his feet and legs, the patient also uses compression socks. He says when he is sitting he is normally in his recliner and has his feet elevated. Education and support given. The patient is keeping his feet elevated when sitting down. The patient states he is monitoring for extra fluid. He states it is moving up his leg more now. Advised patient to discuss changes in sx and sx of heart failure with provider Screening for signs and symptoms of depression related to chronic disease state  Assessed social determinant of health barriers  Symptom Management: Take medications as prescribed   Attend all scheduled provider appointments Call provider office for new concerns or questions  call the Suicide and Crisis Lifeline: 988 call the [place] National Suicide Prevention Lifeline: (812)095-4246 or TTY: 949-622-0377 TTY 717-836-0745) to talk to a trained counselor call 1-800-273-TALK (toll free, 24 hour hotline) if experiencing a Mental Health or [place]  call office if I gain more than 2 pounds in one day or 5 pounds in one week keep legs up while sitting use salt in moderation watch for swelling in feet, ankles and legs every day weigh myself daily develop a rescue plan follow rescue plan if symptoms flare-up track symptoms and what helps feel better or worse dress right for the weather, hot or cold  Follow Up  Plan: Telephone follow up appointment with care management team member scheduled for: 02-08-2023 at 0900 am       CCM:  Maintain, Monitor and Self-Manage Symptoms of COPD       Current Barriers:  Knowledge Deficits related to smoking with the use of oxygen, safety concerns Care Coordination needs related to physical needs and help in the home and how to utilize resources in a patient with COPD Chronic Disease Management support and education needs related to effective management of COPD Lacks caregiver support.  Medication changes and needs for pharm D support with cost effective medications to treat his COPD effectively  Planned Interventions: Provided patient with basic written and verbal COPD education on self care/management/and exacerbation prevention. The patient states the BREO was not helpful for him and he worked with the provider and pharm D and is now on [place]. He has been approved for [employer] and has gotten a sample from the office. The patient is concerned that he is going to run out of [place] and they have  secured him another sample at the office. He will get his sister to go by and pick it up. He should have his supply in mail in 7 to 10 business days. Education and support given to the patient.  Advised patient to track and manage COPD triggers. Review of triggers and how to avoid things that trigger exacerbation. Discussed triggers that could exacerbate his condition. Review of watching for acute changes in weather, smells that could exacerbate his condition, and monitoring others that may have respiratory or contagious illness. The patient is mindful of triggers is staying in his home and does not leave unless he has to do so. Reflective listening and support given. Provided verbal instructions on pursed lip breathing and utilized returned demonstration as teach back. The patient uses oxygen 24/7 at 2.5 liters, has to pace activity. The patient has an issue with his oxygen  concentrator but they replaced this and it is working well now.  Provided instruction about proper use of medications used for management of COPD including inhalers. The patient is compliant with medications. Uses oxygen at 2.5 liters via [place]. Discussed safety with the use of oxygen and smoking. The patient states he is safe and mindful of oxygen use when smoking.  Advised patient to self assesses COPD action plan zone and make appointment with provider if in the yellow zone for 48 hours without improvement. Most recent appointment with the pcp was on 11-26-2022. Discussed getting involved with the pulmonary provider at the [facility] to help with medication and disease management. The patient does not wish to see a pulmonary provider at the [place]. Does have upcoming appointments with the pcp and cardiologist in April. Advised patient to engage in light exercise as tolerated 3-5 days a week to aid in the the management of COPD. Patient limited to the amount of activity he does due to getting short of breath. The patient states that he walks in his apartment and when he goes out he has a power chair he can use if needed. Education on doing leg exercises when sitting to make sure not to loose muscle mass and strength.  Provided education about and advised patient to utilize infection prevention strategies to reduce risk of respiratory infection. Review of high risk of infection with his condition. Discussed the importance of adequate rest and management of fatigue with COPD. The patient tries to get most of his activity done in the am because by afternoon he is really tired. He has help in the home one day a week and he is hopeful to get more help in the home. Education and support given. Screening for signs and symptoms of depression related to chronic disease state  Assessed social determinant of health barriers The patient has changed his [place] appointment and it is now in April. Ask the patient to write down questions to  ask the providers at the [place]. So far the [place] seems to be working well for him. The patient states that he is hoping that it is going to help him. He has already noticed a positive difference.  Symptom Management: Take medications as prescribed   Attend all scheduled provider appointments Call provider office for new concerns or questions  call the Suicide and Crisis Lifeline: 988 call the [place] National Suicide Prevention Lifeline: 856-302-0355 or TTY: 321-615-1277 TTY 607-370-1486) to talk to a trained counselor call 1-800-273-TALK (toll free, 24 hour hotline) if experiencing a Mental Health or [place]  identify and avoid work-related triggers identify and  remove indoor air pollutants limit outdoor activity during cold weather listen for public air quality announcements every day do breathing exercises every day develop a rescue plan eliminate symptom triggers at home follow rescue plan if symptoms flare-up  Follow Up Plan: Telephone follow up appointment with care management team member scheduled for: 02-08-2023 at 0900 am          The patient verbalized understanding of instructions, educational materials, and care plan provided today and agreed to receive a mailed copy of patient instructions, educational materials, and care plan.   Telephone follow up appointment with care management team member scheduled for: 02-08-2023 at 9 am  COPD and Physical Activity Chronic obstructive pulmonary disease (COPD) is a long-term, or chronic, condition that affects the lungs. COPD is a general term that can be used to describe many problems that cause inflammation of the lungs and limit airflow. These conditions include chronic bronchitis and emphysema. The main symptom of COPD is shortness of breath, which makes it harder to do even simple tasks. This can also make it harder to exercise and stay active. Talk with your health care provider about treatments to help you breathe  better and actions you can take to prevent breathing problems during physical activity. What are the benefits of exercising when you have COPD? Exercising regularly is an important part of a healthy lifestyle. You can still exercise and do physical activities even though you have COPD. Exercise and physical activity improve your shortness of breath by increasing blood flow (circulation). This causes your heart to pump more oxygen through your body. Moderate exercise can: Improve oxygen use. Increase your energy level. Help with shortness of breath. Strengthen your breathing muscles. Improve heart health. Help with sleep. Improve your self-esteem and feelings of self-worth. Lower depression, stress, and anxiety. Exercise can benefit everyone with COPD. The severity of your disease may affect how hard you can exercise, especially at first, but everyone can benefit. Talk with your health care provider about how much exercise is safe for you, and which activities and exercises are safe for you. What actions can I take to prevent breathing problems during physical activity? Sign up for a pulmonary rehabilitation program. This type of program may include: Education about lung diseases. Exercise classes that teach you how to exercise and be more active while improving your breathing. This usually involves: Exercise using your lower extremities, such as a stationary bicycle. About 30 minutes of exercise, 2 to 5 times per week, for 6 to 12 weeks. Strength training, such as push-ups or leg lifts. Nutrition education. Group classes in which you can talk with others who also have COPD and learn ways to manage stress. If you use an oxygen tank, you should use it while you exercise. Work with your health care provider to adjust your oxygen for your physical activity. Your resting flow rate is different from your flow rate during physical activity. How to manage your breathing while exercising While you are  exercising: Take slow breaths. Pace yourself, and do nottry to go too fast. Purse your lips while breathing out. Pursing your lips is similar to a kissing or whistling position. If doing exercise that uses a quick burst of effort, such as weight lifting: Breathe in before starting the exercise. Breathe out during the hardest part of the exercise, such as raising the weights. Where to find support You can find support for exercising with COPD from: Your health care provider. A pulmonary rehabilitation program. Your local health  department or community health programs. Support groups, either online or in-person. Your health care provider may be able to recommend support groups. Where to find more information You can find more information about exercising with COPD from: American Lung Association: lung.org COPD Foundation: copdfoundation.org Contact a health care provider if: Your symptoms get worse. You have nausea. You have a fever. You want to start a new exercise program or a new activity. Get help right away if: You have chest pain. You cannot breathe. These symptoms may represent a serious problem that is an emergency. Do not wait to see if the symptoms will go away. Get medical help right away. Call your local emergency services (911 in the [place] Do not drive yourself to the hospital. Summary COPD is a general term that can be used to describe many different lung problems that cause lung inflammation and limit airflow. This includes chronic bronchitis and emphysema. Exercise and physical activity improve your shortness of breath by increasing blood flow (circulation). This causes your heart to provide more oxygen to your body. Contact your health care provider before starting any exercise program or new activity. Ask your health care provider what exercises and activities are safe for you. This information is not intended to replace advice given to you by your health care provider.  Make sure you discuss any questions you have with your health care provider. Document Revised: 08/09/2020 Document Reviewed: 08/09/2020 Elsevier Patient Education  [place]. Budesonide; Glycopyrrolate; Formoterol Metered Dose Inhaler (MDI) What is this medication? BUDESONIDE; GLYCOPYRROLATE; FORMOTEROL (byoo DES oh nide; glye koe PYE roe late; for [facility] te rol) treats chronic obstructive pulmonary disease (COPD). It works by opening the airways of the lungs, making it easier to breathe. It is a combination of an inhaled steroid, an anticholinergic, and a bronchodilator. It is often called a controller inhaler. Do not use it to treat a sudden COPD flare-up. This medicine may be used for other purposes; ask your health care provider or pharmacist if you have questions. COMMON BRAND NAME(S): BREZTRI What should I tell my care team before I take this medication? They need to know if you have any of these conditions: Bladder problems or trouble passing urine Bone problems Diabetes Eye disease, vision problems Heart disease High blood pressure History of irregular heartbeat Immune system problems Infection Kidney disease Pheochromocytoma Prostate disease Seizures Thyroid disease An unusual or allergic reaction to budesonide, formoterol, glycopyrrolate, medications, foods, dyes, or preservatives Pregnant or trying to get pregnant Breast-feeding How should I use this medication? This medication is inhaled through the mouth. Take it as directed on the prescription label at the same time every day. Shake well before each use. Rinse your mouth with water after use. Make sure not to swallow the water. Do not take your medication more often than directed. Keep taking it unless your care team tells you to stop. Make sure that you are using your inhaler correctly. This medication comes with INSTRUCTIONS FOR USE. Ask your pharmacist for directions on how to use this medication. Read the  information carefully. Talk to your pharmacist or care team if you have questions. Talk to your care team about the use of this medication in children. It is not approved for use in children. Overdosage: If you think you have taken too much of this medicine contact a poison control center or emergency room at once. NOTE: This medicine is only for you. Do not share this medicine with others. What if I miss a  dose? If you miss a dose, use it as soon as you can. If it is almost time for your next dose, use only that dose. Do not use double or extra doses. What may interact with this medication? Do not take the medication with any of the following: Cisapride Dofetilide Dronedarone MAOIs like Marplan, Nardil, and Parnate Other medications that contain long-acting beta-2 agonists (LABAs) like arformoterol, formoterol, indacaterol, olodaterol, salmeterol, vilanterol Pimozide Procarbazine Thioridazine This medication may also interact with the following: Antihistamines for allergy, cough, and cold Atropine Certain antibiotics like clarithromycin, telithromycin Certain antivirals for HIV or hepatitis Certain heart medications like atenolol, metoprolol Certain medications for bladder problems like oxybutynin, tolterodine Certain medications for blood pressure, heart disease, irregular heartbeat Certain medications for depression, anxiety, or mental health conditions Certain medications for fungal infections like ketoconazole, itraconazole Certain medications for Parkinson's disease like benztropine, trihexyphenidyl Certain medications for stomach problems like dicyclomine, hyoscyamine Certain medications for travel sickness like scopolamine Diuretics Grapefruit juice Mifepristone Other inhaled medications that contain anticholinergics such as aclidinium, ipratropium, tiotropium, umeclidinium Other medications that prolong the QT interval (an abnormal heart rhythm) Some vaccines Steroid  medications like prednisone or cortisone Stimulant medications for attention disorders, weight loss, or to stay awake Theophylline This list may not describe all possible interactions. Give your health care provider a list of all the medicines, herbs, non-prescription drugs, or dietary supplements you use. Also tell them if you smoke, drink alcohol, or use illegal drugs. Some items may interact with your medicine. What should I watch for while using this medication? Visit your care team for regular checks on your progress. Tell your care team if your symptoms do not start to get better or if they get worse. NEVER use this medication for an acute asthma or COPD attack. You should use your short-acting rescue inhalers for this purpose. If your symptoms get worse or if you need your short-acting inhalers more often, call your care team right away. This medication may increase your risk of getting an infection. Tell your care team if you are around anyone with measles or chickenpox, or if you develop sores or blisters that do not heal properly. Do not get this medication in your eyes. It can cause irritation, pain, or blurred vision. What side effects may I notice from receiving this medication? Side effects that you should report to your care team as soon as possible: Allergic reactions or angioedema--skin rash, itching or hives, swelling of the face, eyes, lips, tongue, arms, or legs, trouble swallowing or breathing Heart rhythm changes--fast or irregular heartbeat, dizziness, feeling faint or lightheaded, chest pain, trouble breathing Increase in blood pressure Low adrenal gland function--nausea, vomiting, loss of appetite, unusual weakness or fatigue, dizziness Muscle pain or cramps Sudden eye pain or change in vision such as blurry vision, seeing halos around lights, vision loss Thrush--white patches in the mouth Trouble passing urine Wheezing or trouble breathing that is worse after use Side  effects that usually do not require medical attention (report to your care team if they continue or are bothersome): Change in taste Constipation Cough Dry mouth Headache Hoarseness Runny or stuffy nose Sore throat Tremors or shaking Trouble sleeping This list may not describe all possible side effects. Call your doctor for medical advice about side effects. You may report side effects to FDA at 1-800-FDA-1088. Where should I keep my medication? Keep out of the reach of children and pets. Store in a dry place at room temperature between 15 and 30 degrees  C (59 and 86 degrees F). Protect from heat. Do not freeze. Do not use or store near heat or flame, as the canister may burst. Throw away the inhaler 3 months after you open the foil pouch (for the 120-inhalation canister), or 3 weeks after you open the foil pouch (for the 28-inhalation canister), or when the dose indicator reaches zero "0", whichever comes first. To get rid of medication that are no longer needed or have expired: Take the medication to a medication take-back program. Check with your pharmacy or law enforcement to find a location. If you cannot return the medication, ask your pharmacist or care team how to get rid of this medication safely. NOTE: This sheet is a summary. It may not cover all possible information. If you have questions about this medicine, talk to your doctor, pharmacist, or health care provider.  2023 Elsevier/Gold Standard (2021-07-18 00:00:00)

## 2023-01-11 NOTE — Chronic Care Management (AMB) (Signed)
Chronic Care Management   CCM RN Visit Note  01/11/2023 Name: Christopher Armstrong MRN: [id number] DOB: [birth year]  Subjective: Christopher Armstrong is a 82 y.o. year old male who is a primary care patient of [name], DO. The patient was referred to the Chronic Care Management team for assistance with care management needs subsequent to provider initiation of CCM services and plan of care.    Today's Visit:  Engaged with patient by telephone for follow up visit.        Goals Addressed             This Visit's Progress    CCM Expected Outcome:  Monitor, Self-Manage and Reduce Symptoms of Heart Failure       Current Barriers:  Knowledge Deficits related to the changes that can take place with heart failure and the sx and sx of heart failure Care Coordination needs related to utilization of resources and finding help in the  home in a patient with heart failure Chronic Disease Management support and education needs related to effective management of heart failure Lacks caregiver support.     Wt Readings from Last 3 Encounters:  11/26/22 179 lb 9.6 oz (81.5 kg)  09/19/22 179 lb 9.6 oz (81.5 kg)  03/06/22 179 lb 9.6 oz (81.5 kg)   Patients weight is stable Planned Interventions: Basic overview and discussion of pathophysiology of Heart Failure reviewed. Discussed changes in heart health and his blood pressures have been elevated in the evenings. Encouraged the patient to talk to the pharm D about taking one of his medications for blood pressures at night.  Provided education on low sodium diet. Education on monitoring for sodium in diet and not using extra salt in foods. The patient does cook for self some and the patient receives MOW. The patient is mindful of sodium in his diet. The patient states that he is eating and he is watching his sodium. The food is bland, especially from MOW and he only has 3 teeth that he can chew with so he is having to eat softer food.  Reviewed Heart  Failure Action Plan in depth. The patient states that in the past when his weight goes up he has taken an extra fluid pill as directed by the provider.  Review of monitoring for +2/3 in one day or +5 in one week and to call the provider for changes or new onset of heart failure sx and sx. Education and support given. The patient denies any new changes in his heart failure. Will follow up with the cardiologist in April at the [place].  Assessed need for readable accurate scales in home. The patient has a scale and the patient states that he can weigh daily but he does not. Education on the benefits of daily weights in monitoring for exacerbation of heart failure. Since the last outreach the patient has been weighing consistently. His weight is stable and staying around 179 currently Provided education about placing scale on hard, flat surface Advised patient to weigh each morning after emptying bladder Discussed importance of daily weight and advised patient to weigh and record daily Reviewed role of diuretics in prevention of fluid overload and management of heart failure. The patient states that he does take his Lasix as prescribed and is at the 40 mg dose.  Discussed the importance of keeping all appointments with provider. The patient states that he sees the heart specialist every 6 months. The patient had a video visit with the pcp  on 11-26-2022 and got a refill for his Coreg. Provided patient with education about the role of exercise in the management of heart failure. The patient is limited in his ability to do exercise due to shortness of breath.  Review of elevation of feet and legs when sitting to help with fluid, the patient has swelling in his feet and legs, the patient also uses compression socks. He says when he is sitting he is normally in his recliner and has his feet elevated. Education and support given. The patient is keeping his feet elevated when sitting down. The patient states he is  monitoring for extra fluid. He states it is moving up his leg more now. Advised patient to discuss changes in sx and sx of heart failure with provider Screening for signs and symptoms of depression related to chronic disease state  Assessed social determinant of health barriers  Symptom Management: Take medications as prescribed   Attend all scheduled provider appointments Call provider office for new concerns or questions  call the Suicide and Crisis Lifeline: 988 call the [place] National Suicide Prevention Lifeline: (701)032-5917 or TTY: 586-141-1259 TTY 726 410 1522) to talk to a trained counselor call 1-800-273-TALK (toll free, 24 hour hotline) if experiencing a Mental Health or [place]  call office if I gain more than 2 pounds in one day or 5 pounds in one week keep legs up while sitting use salt in moderation watch for swelling in feet, ankles and legs every day weigh myself daily develop a rescue plan follow rescue plan if symptoms flare-up track symptoms and what helps feel better or worse dress right for the weather, hot or cold  Follow Up Plan: Telephone follow up appointment with care management team member scheduled for: 02-08-2023 at 0900 am       CCM:  Maintain, Monitor and Self-Manage Symptoms of COPD       Current Barriers:  Knowledge Deficits related to smoking with the use of oxygen, safety concerns Care Coordination needs related to physical needs and help in the home and how to utilize resources in a patient with COPD Chronic Disease Management support and education needs related to effective management of COPD Lacks caregiver support.  Medication changes and needs for pharm D support with cost effective medications to treat his COPD effectively  Planned Interventions: Provided patient with basic written and verbal COPD education on self care/management/and exacerbation prevention. The patient states the BREO was not helpful for him and he worked  with the provider and pharm D and is now on [place]. He has been approved for [employer] and has gotten a sample from the office. The patient is concerned that he is going to run out of [place] and they have secured him another sample at the office. He will get his sister to go by and pick it up. He should have his supply in mail in 7 to 10 business days. Education and support given to the patient.  Advised patient to track and manage COPD triggers. Review of triggers and how to avoid things that trigger exacerbation. Discussed triggers that could exacerbate his condition. Review of watching for acute changes in weather, smells that could exacerbate his condition, and monitoring others that may have respiratory or contagious illness. The patient is mindful of triggers is staying in his home and does not leave unless he has to do so. Reflective listening and support given. Provided verbal instructions on pursed lip breathing and utilized returned demonstration as teach back. The patient  uses oxygen 24/7 at 2.5 liters, has to pace activity. The patient has an issue with his oxygen concentrator but they replaced this and it is working well now.  Provided instruction about proper use of medications used for management of COPD including inhalers. The patient is compliant with medications. Uses oxygen at 2.5 liters via [place]. Discussed safety with the use of oxygen and smoking. The patient states he is safe and mindful of oxygen use when smoking.  Advised patient to self assesses COPD action plan zone and make appointment with provider if in the yellow zone for 48 hours without improvement. Most recent appointment with the pcp was on 11-26-2022. Discussed getting involved with the pulmonary provider at the [facility] to help with medication and disease management. The patient does not wish to see a pulmonary provider at the [place]. Does have upcoming appointments with the pcp and cardiologist in April. Advised patient to engage in  light exercise as tolerated 3-5 days a week to aid in the the management of COPD. Patient limited to the amount of activity he does due to getting short of breath. The patient states that he walks in his apartment and when he goes out he has a power chair he can use if needed. Education on doing leg exercises when sitting to make sure not to loose muscle mass and strength.  Provided education about and advised patient to utilize infection prevention strategies to reduce risk of respiratory infection. Review of high risk of infection with his condition. Discussed the importance of adequate rest and management of fatigue with COPD. The patient tries to get most of his activity done in the am because by afternoon he is really tired. He has help in the home one day a week and he is hopeful to get more help in the home. Education and support given. Screening for signs and symptoms of depression related to chronic disease state  Assessed social determinant of health barriers The patient has changed his [place] appointment and it is now in April. Ask the patient to write down questions to ask the providers at the [place]. So far the [place] seems to be working well for him. The patient states that he is hoping that it is going to help him. He has already noticed a positive difference.  Symptom Management: Take medications as prescribed   Attend all scheduled provider appointments Call provider office for new concerns or questions  call the Suicide and Crisis Lifeline: 988 call the [place] National Suicide Prevention Lifeline: 507-870-0103 or TTY: 519-887-7350 TTY 475-466-4244) to talk to a trained counselor call 1-800-273-TALK (toll free, 24 hour hotline) if experiencing a Mental Health or [place]  identify and avoid work-related triggers identify and remove indoor air pollutants limit outdoor activity during cold weather listen for public air quality announcements every day do breathing exercises  every day develop a rescue plan eliminate symptom triggers at home follow rescue plan if symptoms flare-up  Follow Up Plan: Telephone follow up appointment with care management team member scheduled for: 02-08-2023 at 0900 am          Plan:Telephone follow up appointment with care management team member scheduled for:  02-08-2023 at 0900 am  [place], MSN, CCM RN Care Manager  Chronic Care Management Direct Number: [phone]

## 2023-01-11 NOTE — Progress Notes (Signed)
01/11/2023 Name: Christopher Armstrong MRN: [id number] DOB: 22-May-1941  No chief complaint on file.   Christopher Armstrong is a 82 y.o. year old male who presented for a telephone visit. Returned callback to patient in response to a voicemail.   They were referred to the pharmacist by their Case Management Team  for assistance in managing medication access.    Subjective:  Care Team: Primary Care Provider: [name], DO ;  Medication Access/Adherence  Current Pharmacy:  [facility], [place] [id number] [place] [address] Phone: [phone] Fax: [place], [place] [place] [place] [address] Phone: [phone] Fax: [place], [place] [address] [address] Phone: [phone] Fax: [place], [place] - [place] [address] [place] [address] Phone: [phone] Fax: [phone]   Patient reports affordability concerns with their medications: Yes   COPD:  Current medications: Breztri using sample, then will begin patient assistance, expected shipment to his home ~April 10-17.  Patient had questions on how to take the medication.  Current medication access support: Breztri via [employer]   Objective:  No results found for: "HGBA1C"  Lab Results  Component Value Date   CREATININE 0.90 05/09/2020   BUN 14 05/09/2020   NA 140 05/09/2020   K 4.0 05/09/2020   CL 102 05/09/2020   CO2 28 05/09/2020     Medications Reviewed Today     Reviewed by Vanita Ingles, RN (Case Manager) on 01/11/23 at 1044  Med List Status: <None>   Medication Order Taking? Sig Documenting Provider Last Dose Status  Informant  albuterol (VENTOLIN HFA) 108 (90 Base) MCG/ACT inhaler [id number] No Inhale 1-2 puffs into the lungs every 6 (six) hours as needed for wheezing or shortness of breath. [name], DO Taking Active   AMBULATORY NON [place] [id number] No Motorized wheelchair per patient preference and insurance coverage. Patient suffers from copd and atrial fibrillation and debility d/t recent hospitalization which impairs their ability to perform daily activities like bathing, dressing, grooming, and toileting in the home.  A cane, crutch,walker or manual wheelchair will not resolve issue with performing activities of daily living. A  power wheelchair will allow patient to safely perform daily activities. Patient unable to safely propel the wheelchair and does not have a caregiver who can provide assistance. Length of need Lifetime.  Fax [phone] [name], DO Taking Active   amLODipine (NORVASC) 5 MG tablet [id number] No Take 1 tablet by mouth daily. [provider] Taking Active   apixaban (ELIQUIS) 5 MG TABS tablet [id number] No Take 1 tablet (5 mg total) by mouth 2 (two) times daily.  Patient taking differently: Take 2.5 mg by mouth 2 (two) times daily.   [name], DO Taking Active            Med Note Tonny Bollman Jun 28, 2022  2:08 PM) Obtains through [place]  aspirin EC 81 MG tablet [id number] No Take 1 tablet (81 mg total) by mouth daily. Lelon Perla, MD Taking Active   Budeson-Glycopyrrol-Formoterol (BREZTRI AEROSPHERE) 160-9-4.8 MCG/ACT Hollie Salk [id number]  Inhale 2 puffs into the lungs 2 (two) times daily. [name], DO  Active   carvedilol (COREG) 12.5 MG tablet [id number] No Take 1 tablet (12.5 mg total) by mouth 2 (two) times daily with a meal. [name], DO Taking Active   fluticasone-salmeterol (ADVAIR HFA) 115-21 MCG/ACT inhaler [id number]  Inhale 2 puffs into the lungs 2 (two) times daily. [name], DO  Active   furosemide (LASIX) 40 MG  tablet [id number] No TAKE 1/2 TO 1 (ONE-HALF TO ONE) TABLET BY MOUTH ONCE DAILY [name], DO Taking Active            Med Note Darius Bump   Fri Dec 07, 2022  2:59 PM) One tablet daily as of 12/07/22 review  ipratropium-albuterol (DUONEB) 0.5-2.5 (3) MG/3ML SOLN [id number] No Take 3 mLs by nebulization every 4 (four) hours as needed. [name], DO Taking Active   lisinopril (ZESTRIL) 40 MG tablet [id number] No Take 1 tablet (40 mg total) by mouth daily. [name], DO Taking Active   magnesium hydroxide (MILK OF MAGNESIA) 400 MG/5ML suspension [id number] No Take 30 mLs by mouth as needed. [provider] Taking Active   nitroGLYCERIN (NITROSTAT) 0.4 MG SL tablet [id number] No Place 1 tablet (0.4 mg total) under the tongue every 5 (five) minutes as needed for chest pain. [name], DO Taking Active   OXYGEN [id number] No Inhale into the lungs daily. 2L [provider] Taking Active            Med Note Carnella [place] Mar 02, 2020 10:30 AM)    predniSONE (DELTASONE) 50 MG tablet [id number]  Take 50mg  daily x5 days [name], DO  Active   Respiratory Therapy Supplies MISC [id number] No Nebulizer tubing [name], DO Taking Active               Assessment/Plan:   COPD: - Currently controlled - Provided medication counseling to patient on how to take new medication. Answered patient questions.  - Patient did express concerns that sample will only get him ~1 week duration using 2 puffs BID. Will facilitate another sample to be provided and available at PCP office for pickup.  - Judithann Sauger is approved through manufacturer patient assistance as of 12/20/22 through [employer]Me online application. Prescription was sent to [facility] 01/09/23, expect delivery to patients home in ~10-14 business days, so April 10-17.   Follow Up Plan: 1-2 weeks  Larinda Buttery, PharmD Clinical Pharmacist [facility] [phone]

## 2023-01-11 NOTE — Telephone Encounter (Signed)
   CCM RN Visit Note   01-11-2023 Name: Christopher Armstrong MRN: [id number]      DOB: Aug 28, 1941  Subjective: Christopher Armstrong is a 82 y.o. year old male who is a primary care patient of @PCP . The patient was referred to the Chronic Care Management team for assistance with care management needs subsequent to provider initiation of CCM services and plan of care.      The patient returned call and call was completed. See new encounter.  Plan:Telephone follow up appointment with care management team member scheduled for:  02-08-2023 at 52 am  [place], MSN, CCM RN Care Manager  Chronic Care Management Direct Number: [phone]

## 2023-01-13 DIAGNOSIS — I5032 Chronic diastolic (congestive) heart failure: Secondary | ICD-10-CM

## 2023-01-13 DIAGNOSIS — J4489 Other specified chronic obstructive pulmonary disease: Secondary | ICD-10-CM

## 2023-01-14 ENCOUNTER — Telehealth: Payer: Self-pay | Admitting: Family Medicine

## 2023-01-14 NOTE — Telephone Encounter (Signed)
Pts sister called to confirm his inhaler was ready to be picked up. I was unable to find the inhaler by the front desk.   Burna Forts [phone]

## 2023-01-14 NOTE — Telephone Encounter (Signed)
Care Coordination  I can outreach patient and/or patient's sister after seeing confirmation from office staff that we have a Breztri sample, and it can be arranged to be at front desk for pickup.  Patient also contacted my direct line and left voicemail with same inquiry for sample.  Or if someone else calls, that is fine - just route back my way as FYI so I can be sure the task is complete!  Thank you, Lysle Morales

## 2023-01-14 NOTE — Telephone Encounter (Signed)
Spoke to Mr. Christopher Armstrong. His medication won't arrive before the current inhaler sample is empty. Requesting an additional inhaler sample.   Sample left at [facility] for pick-up.

## 2023-01-15 ENCOUNTER — Ambulatory Visit: Payer: Self-pay | Admitting: Licensed Clinical Social Worker

## 2023-01-16 NOTE — Patient Instructions (Signed)
Visit Information  Thank you for taking time to visit with me today. Please don't hesitate to contact me if I can be of assistance to you.   Following are the goals we discussed today:   Goals Addressed             This Visit's Progress    Obtain Personal Care Aid   On track    Activities and task to complete in order to accomplish goals.   Keep all upcoming appointments discussed today Continue with compliance of taking medication prescribed by Doctor Implement healthy coping skills discussed to assist with management of symptoms Continue working with [facility] care team to assist with goals identified          Our next appointment is by telephone on 5/7 at 11 AM  Please call the care guide team at [phone] if you need to cancel or reschedule your appointment.   If you are experiencing a Mental Health or [place] or need someone to talk to, please call the Suicide and Crisis Lifeline: 988 call 911   The patient verbalized understanding of instructions, educational materials, and care plan provided today and DECLINED offer to receive copy of patient instructions, educational materials, and care plan.   [name], MSW, [email or web address] Phone [phone] 8:49 AM

## 2023-01-16 NOTE — Patient Outreach (Signed)
  Care Coordination   Follow Up Visit Note   01/15/23 Name: Christopher Armstrong MRN: [id number] DOB: [birth year]  Christopher Armstrong is a 82 y.o. year old male who sees [name], DO for primary care. I spoke with  [name] by phone today.  What matters to the patients health and wellness today?  Symptom Management/Transportation    Goals Addressed             This Visit's Progress    Obtain Personal Care Aid   On track    Activities and task to complete in order to accomplish goals.   Keep all upcoming appointments discussed today Continue with compliance of taking medication prescribed by Doctor Implement healthy coping skills discussed to assist with management of symptoms Continue working with [facility] care team to assist with goals identified          SDOH assessments and interventions completed:  No     Care Coordination Interventions:  Yes, provided  Interventions Today    Flowsheet Row Most Recent Value  Chronic Disease   Chronic disease during today's visit Hypertension (HTN), Congestive Heart Failure (CHF), Chronic Obstructive Pulmonary Disease (COPD)  General Interventions   General Interventions Discussed/Reviewed General Interventions Reviewed, Doctor Visits  [LCSW reviewed upcoming appts and assessed for any barriers to attending them. Pt's sister will provide transportation to both [place] appts]  Doctor Visits Discussed/Reviewed Doctor Visits Reviewed  [place] Discussed/Reviewed Coping Strategies  [Stress management]  Nutrition Interventions   Nutrition Discussed/Reviewed Nutrition Reviewed  [Pt continues to receive MOW]  Pharmacy Interventions   Pharmacy Dicussed/Reviewed Pharmacy Topics Reviewed, Affording Medications, Medication Adherence  [Pt is utilizing samples and is expecting full prescription being mailed this week. Endorses increase in management of symptoms with Brezti]       Follow up plan: Follow up call  scheduled for 4-6 weeks    Encounter Outcome:  Pt. Visit Completed   [name], MSW, [email or web address] Phone [phone] 8:47 AM

## 2023-01-17 ENCOUNTER — Other Ambulatory Visit: Payer: Medicare (Managed Care) | Admitting: Pharmacist

## 2023-01-17 NOTE — Progress Notes (Signed)
01/17/2023 Name: Christopher Armstrong MRN: [id number] DOB: 1940/10/29  Chief Complaint  Patient presents with   Medication Assistance    Christopher Armstrong is a 82 y.o. year old male who presented for a telephone visit.   They were referred to the pharmacist by their Case Management Team  for assistance in managing medication access.    Subjective:  Care Team: Primary Care Provider: [name], DO ;  Medication Access/Adherence  Current Pharmacy:  [facility] [place] [id number] [place] [address] Phone: [phone] Fax: [place], [place] [address] Phone: [phone] Fax: [place], [place] [address] [place] [address] Phone: [phone] Fax: [place], [place] - [place] [address] [place] [address] Phone: [phone] Fax: [phone]   Patient reports affordability concerns with their medications: Yes   COPD:  Current medications: Breztri using sample, then will begin patient assistance, expected shipment to his home ~April 10-17.  Patient reports increased amount of phlegm, but this is ongoing and cannot recall the timeline of when it started. He is not sure if it is related to the timing of his inhalers changing. He wants to try some OTC medications first to manage symptomatic relief.  Current medication access support: Breztri via [employer]   Objective:  No results found for: "HGBA1C"  Lab Results  Component Value Date   CREATININE 0.90 05/09/2020   BUN 14 05/09/2020   NA 140 05/09/2020   K 4.0 05/09/2020   CL 102 05/09/2020   CO2 28 05/09/2020     Medications  Reviewed Today     Reviewed by Darius Bump, [place] (Pharmacist) on 01/17/23 at [place] List Status: <None>   Medication Order Taking? Sig Documenting Provider Last Dose Status Informant  albuterol (VENTOLIN HFA) 108 (90 Base) MCG/ACT inhaler [id number] No Inhale 1-2 puffs into the lungs every 6 (six) hours as needed for wheezing or shortness of breath. [name], DO Taking Active   AMBULATORY NON [place] [id number] No Motorized wheelchair per patient preference and insurance coverage. Patient suffers from copd and atrial fibrillation and debility d/t recent hospitalization which impairs their ability to perform daily activities like bathing, dressing, grooming, and toileting in the home.  A cane, crutch,walker or manual wheelchair will not resolve issue with performing activities of daily living. A  power wheelchair will allow patient to safely perform daily activities. Patient unable to safely propel the wheelchair and does not have a caregiver who can provide assistance. Length of need Lifetime.  Fax [phone] [name], DO Taking Active   amLODipine (NORVASC) 5 MG tablet [id number] No Take 1 tablet by mouth daily. [provider] Taking Active   apixaban (ELIQUIS) 5 MG TABS tablet [id number] No Take 1 tablet (5 mg total) by mouth 2 (two) times daily.  Patient taking differently: Take 2.5 mg by mouth 2 (two) times daily.   [name], DO Taking Active            Med Note Tonny Bollman Jun 28, 2022  2:08 PM) Obtains through [place]  aspirin EC 81 MG tablet [id number] No Take 1 tablet (81 mg total)  by mouth daily. Lelon Perla, MD Taking Active   Budeson-Glycopyrrol-Formoterol (BREZTRI AEROSPHERE) 160-9-4.8 MCG/ACT Hollie Salk [id number]  Inhale 2 puffs into the lungs 2 (two) times daily. [name], DO  Active   carvedilol (COREG) 12.5 MG tablet [id number] No Take 1 tablet (12.5 mg total) by mouth 2 (two) times daily with a meal. [name], DO Taking  Active   fluticasone-salmeterol (ADVAIR HFA) 115-21 MCG/ACT inhaler [id number]  Inhale 2 puffs into the lungs 2 (two) times daily. [name], DO  Active   furosemide (LASIX) 40 MG tablet [id number] No TAKE 1/2 TO 1 (ONE-HALF TO ONE) TABLET BY MOUTH ONCE DAILY [name], DO Taking Active            Med Note Darius Bump   Fri Dec 07, 2022  2:59 PM) One tablet daily as of 12/07/22 review  ipratropium-albuterol (DUONEB) 0.5-2.5 (3) MG/3ML SOLN [id number] No Take 3 mLs by nebulization every 4 (four) hours as needed. [name], DO Taking Active   lisinopril (ZESTRIL) 40 MG tablet [id number] No Take 1 tablet (40 mg total) by mouth daily. [name], DO Taking Active   magnesium hydroxide (MILK OF MAGNESIA) 400 MG/5ML suspension [id number] No Take 30 mLs by mouth as needed. [provider] Taking Active   nitroGLYCERIN (NITROSTAT) 0.4 MG SL tablet [id number] No Place 1 tablet (0.4 mg total) under the tongue every 5 (five) minutes as needed for chest pain. [name], DO Taking Active   OXYGEN [id number] No Inhale into the lungs daily. 2L [provider] Taking Active            Med Note Carnella [place] Mar 02, 2020 10:30 AM)    predniSONE (DELTASONE) 50 MG tablet [id number]  Take 50mg  daily x5 days [name], DO  Active   Respiratory Therapy Supplies MISC [id number] No Nebulizer tubing [name], DO Taking Active               Assessment/Plan:   COPD: - Currently controlled - Recommend continue current regimen, try OTC products for symptomatic relief of increased phlegm, and advised patient to contact PCP office for sick visit if he develops fevers or worsened symptoms. - Judithann Sauger is approved through manufacturer patient assistance as of 12/20/22 through [employer] online application. Prescription was sent to [facility] 01/09/23, expect delivery to patients home in ~10-14 business days, so April 10-17.   Follow Up Plan: 2 weeks  Larinda Buttery, PharmD Clinical Pharmacist [facility] [phone]

## 2023-01-23 NOTE — Telephone Encounter (Signed)
Pt reached out to f/u on status and shipment of Breztri. Informed pt I havent heard from company but will give him a call back once I do. Pt has about 2 days worth of sample remaining at home.

## 2023-01-24 ENCOUNTER — Other Ambulatory Visit: Payer: Self-pay | Admitting: Pharmacist

## 2023-01-24 NOTE — Progress Notes (Signed)
Care Coordination Call   Outreached patient in response to voicemail x2 on 01/23/23. Patient had prior confusion about patient assistance supply, working with Durward Mallard from [employer] med assist team. The confusion likely stemmed from RX med assist team completing paper application, but when patient was on a tight timeline, I submitted an online application which received immediate approval.  He also contacted [employer] directly to inquire status update. The company confirmed it was delivered on 01/21/23 to his apartment's central office mailroom.  Today upon outreach, patient confirmed he does have his medication supply (Breztri, via [employer]) and all is well at this time.  Will forward to RX Med assist team as FYI to close the loop.  Will follow up with patient in 1 month.  Lynnda Shields, PharmD, BCPS Clinical Pharmacist [facility]

## 2023-01-28 DIAGNOSIS — J42 Unspecified chronic bronchitis: Secondary | ICD-10-CM | POA: Diagnosis not present

## 2023-01-28 DIAGNOSIS — J449 Chronic obstructive pulmonary disease, unspecified: Secondary | ICD-10-CM | POA: Diagnosis not present

## 2023-02-01 ENCOUNTER — Other Ambulatory Visit: Payer: Medicare (Managed Care) | Admitting: Pharmacist

## 2023-02-08 ENCOUNTER — Ambulatory Visit (INDEPENDENT_AMBULATORY_CARE_PROVIDER_SITE_OTHER): Payer: Medicare (Managed Care)

## 2023-02-08 ENCOUNTER — Telehealth: Payer: Medicare (Managed Care)

## 2023-02-08 DIAGNOSIS — J4489 Other specified chronic obstructive pulmonary disease: Secondary | ICD-10-CM

## 2023-02-08 DIAGNOSIS — I5032 Chronic diastolic (congestive) heart failure: Secondary | ICD-10-CM

## 2023-02-08 NOTE — Patient Instructions (Signed)
Please call the care guide team at [phone] if you need to cancel or reschedule your appointment.   If you are experiencing a Mental Health or Behavioral Health Crisis or need someone to talk to, please call the Suicide and Crisis Lifeline: 988 call the [place] National Suicide Prevention Lifeline: 604 634 0879 or TTY: 856-466-6490 TTY 7145472231) to talk to a trained counselor call 1-800-273-TALK (toll free, 24 hour hotline) go to [facility] [address], [place] [phone])   Following is a copy of the CCM Program Consent:  CCM service includes personalized support from designated clinical staff supervised by the physician, including individualized plan of care and coordination with other care providers 24/7 contact phone numbers for assistance for urgent and routine care needs. Service will only be billed when office clinical staff spend 20 minutes or more in a month to coordinate care. Only one practitioner may furnish and bill the service in a calendar month. The patient may stop CCM services at amy time (effective at the end of the month) by phone call to the office staff. The patient will be responsible for cost sharing (co-pay) or up to 20% of the service fee (after annual deductible is met)  Following is a copy of your full provider care plan:   Goals Addressed             This Visit's Progress    CCM Expected Outcome:  Monitor, Self-Manage and Reduce Symptoms of Heart Failure       Current Barriers:  Knowledge Deficits related to the changes that can take place with heart failure and the sx and sx of heart failure Care Coordination needs related to utilization of resources and finding help in the  home in a patient with heart failure Chronic Disease Management support and education needs related to effective management of heart failure Lacks caregiver support.     Wt Readings from Last 3 Encounters:  11/26/22 179 lb 9.6 oz  (81.5 kg)  09/19/22 179 lb 9.6 oz (81.5 kg)  03/06/22 179 lb 9.6 oz (81.5 kg)   Patients weight is stable. Was stable at the provider office was 170. Planned Interventions: Basic overview and discussion of pathophysiology of Heart Failure reviewed. Discussed changes in heart health and his blood pressures have been elevated in the evenings. Encouraged the patient to talk to the pharm D about taking one of his medications for blood pressures at night. Continues to work with the pharm D on a regular basis.  Provided education on low sodium diet. Education on monitoring for sodium in diet and not using extra salt in foods. The patient does cook for self some and the patient receives MOW. The patient is mindful of sodium in his diet. The patient states that he is eating and he is watching his sodium. The food is bland, especially from MOW and he only has 3 teeth that he can chew with so he is having to eat softer food.  Reviewed Heart Failure Action Plan in depth. The patient states that in the past when his weight goes up he has taken an extra fluid pill as directed by the provider.  Review of monitoring for +2/3 in one day or +5 in one week and to call the provider for changes or new onset of heart failure sx and sx. Education and support given. The patient denies any new changes in his heart failure. Will follow up with the cardiologist on February 12, 2023. Assessed need for readable  accurate scales in home. The patient has a scale and the patient states that he can weigh daily but he does not. Education on the benefits of daily weights in monitoring for exacerbation of heart failure. Since the last outreach the patient has been weighing consistently. His weight is stable and staying around 170 currently at the doctor office. Provided education about placing scale on hard, flat surface Advised patient to weigh each morning after emptying bladder Discussed importance of daily weight and advised patient to  weigh and record daily Reviewed role of diuretics in prevention of fluid overload and management of heart failure. The patient states that he does take his Lasix as prescribed and is at the 40 mg dose.  Discussed the importance of keeping all appointments with provider. The patient states that he sees the heart specialist every 6 months, appointment on 02-12-2023. Saw the pcp on 02-07-2023 and got things in place for someone to come into the home 6 hours a week. The nurse will be out today to evaluate his home environment.  Provided patient with education about the role of exercise in the management of heart failure. The patient is limited in his ability to do exercise due to shortness of breath.  Review of elevation of feet and legs when sitting to help with fluid, the patient has swelling in his feet and legs, the patient also uses compression socks. He says when he is sitting he is normally in his recliner and has his feet elevated. Education and support given. The patient is keeping his feet elevated when sitting down. The patient states he is monitoring for extra fluid. He states it is moving up his leg more now. Advised patient to discuss changes in sx and sx of heart failure with provider Screening for signs and symptoms of depression related to chronic disease state  Assessed social determinant of health barriers  Symptom Management: Take medications as prescribed   Attend all scheduled provider appointments Call provider office for new concerns or questions  call the Suicide and Crisis Lifeline: 988 call the [place] National Suicide Prevention Lifeline: 3651620689 or TTY: 605-888-6330 TTY (502) 052-0540) to talk to a trained counselor call 1-800-273-TALK (toll free, 24 hour hotline) if experiencing a Mental Health or Behavioral Health Crisis  call office if I gain more than 2 pounds in one day or 5 pounds in one week keep legs up while sitting use salt in moderation watch for swelling in  feet, ankles and legs every day weigh myself daily develop a rescue plan follow rescue plan if symptoms flare-up track symptoms and what helps feel better or worse dress right for the weather, hot or cold  Follow Up Plan: Telephone follow up appointment with care management team member scheduled for: 03-22-2023 at 0900 am       CCM:  Maintain, Monitor and Self-Manage Symptoms of COPD       Current Barriers:  Knowledge Deficits related to smoking with the use of oxygen, safety concerns Care Coordination needs related to physical needs and help in the home and how to utilize resources in a patient with COPD Chronic Disease Management support and education needs related to effective management of COPD Lacks caregiver support.  Medication changes and needs for pharm D support with cost effective medications to treat his COPD effectively  Planned Interventions: Provided patient with basic written and verbal COPD education on self care/management/and exacerbation prevention. The patient states he is using the [place], and is helping more than he thought.  He is using the rescue inhaler a little more but states that he is doing better and thankful for that. Has had follow ups with his pulmonary provider, and cardiologist at the [facility]. He saw the pcp and eye doctor yesterday at the [place], his sister was with him. Had several referrals put in place. Advised patient to track and manage COPD triggers. Review of triggers and how to avoid things that trigger exacerbation. Discussed triggers that could exacerbate his condition. Review of watching for acute changes in weather, smells that could exacerbate his condition, and monitoring others that may have respiratory or contagious illness. The patient is mindful of triggers is staying in his home and does not leave unless he has to do so. Reflective listening and support given. Provided verbal instructions on pursed lip breathing and utilized returned demonstration as  teach back. The patient uses oxygen 24/7 at 2.5 liters, has to pace activity.  Provided instruction about proper use of medications used for management of COPD including inhalers. The patient is compliant with medications. Uses oxygen at 2.5 liters via [place]. Discussed safety with the use of oxygen and smoking. The patient states he is safe and mindful of oxygen use when smoking. He works with the pharm D on a regular basis.   Advised patient to self assesses COPD action plan zone and make appointment with provider if in the yellow zone for 48 hours without improvement. Most recent appointment with the pcp  at [facility] was on 02-07-2023. Discussed getting involved with the pulmonary provider at the [facility] to help with medication and disease management. He has an appointment on 02-12-2023 with cardiologist at the [facility]. He has an appointment with a nurse today at his home. They are going to do an evaluation for his home. He has been approved for 6 hours a week for someone to come into his home and help with bathing and cleaning and meeting his needs. He is happy about this. Reflective listening and support given.  Advised patient to engage in light exercise as tolerated 3-5 days a week to aid in the the management of COPD. Patient limited to the amount of activity he does due to getting short of breath. The patient states that he walks in his apartment and when he goes out he has a power chair he can use if needed. Education on doing leg exercises when sitting to make sure not to loose muscle mass and strength.  Provided education about and advised patient to utilize infection prevention strategies to reduce risk of respiratory infection. Review of high risk of infection with his condition. Discussed the importance of adequate rest and management of fatigue with COPD. The patient tries to get most of his activity done in the am because by afternoon he is really tired. He has help in the home one day a week and he is hopeful to  get more help in the home. Education and support given. Screening for signs and symptoms of depression related to chronic disease state  Assessed social determinant of health barriers The patient saw the pcp at the [place] yesterday, was also able to get in to see the eye doctor and get new glasses ordered. This was good for the patient as he needed new glasses. The patient states that his new glasses have been ordered. The SW came in and got him set up for 6 hours of in home care a week and the nurse has called and is coming this afternoon for evaluation of  his apartment. The patient is thankful for this as he has been concerned about safety in the shower. Will continue to monitor.   Symptom Management: Take medications as prescribed   Attend all scheduled provider appointments Call provider office for new concerns or questions  call the Suicide and Crisis Lifeline: 988 call the [place] National Suicide Prevention Lifeline: 2560347365 or TTY: (760)093-5814 TTY 360 242 3174) to talk to a trained counselor call 1-800-273-TALK (toll free, 24 hour hotline) if experiencing a Mental Health or Behavioral Health Crisis  identify and avoid work-related triggers identify and remove indoor air pollutants limit outdoor activity during cold weather listen for public air quality announcements every day do breathing exercises every day develop a rescue plan eliminate symptom triggers at home follow rescue plan if symptoms flare-up  Follow Up Plan: Telephone follow up appointment with care management team member scheduled for: 03-22-2023 at 0900 am          The patient verbalized understanding of instructions, educational materials, and care plan provided today and DECLINED offer to receive copy of patient instructions, educational materials, and care plan.  Telephone follow up appointment with care management team member scheduled for: 03-22-2023 at 9 am

## 2023-02-08 NOTE — Chronic Care Management (AMB) (Signed)
Chronic Care Management   CCM RN Visit Note  02/08/2023 Name: Christopher Armstrong MRN: [id number] DOB: 11/15/1940  Subjective: Christopher Armstrong is a 82 y.o. year old male who is a primary care patient of Everrett Coombe, DO. The patient was referred to the Chronic Care Management team for assistance with care management needs subsequent to provider initiation of CCM services and plan of care.    Today's Visit:  Engaged with patient by telephone for follow up visit.        Goals Addressed             This Visit's Progress    CCM Expected Outcome:  Monitor, Self-Manage and Reduce Symptoms of Heart Failure       Current Barriers:  Knowledge Deficits related to the changes that can take place with heart failure and the sx and sx of heart failure Care Coordination needs related to utilization of resources and finding help in the  home in a patient with heart failure Chronic Disease Management support and education needs related to effective management of heart failure Lacks caregiver support.     Wt Readings from Last 3 Encounters:  11/26/22 179 lb 9.6 oz (81.5 kg)  09/19/22 179 lb 9.6 oz (81.5 kg)  03/06/22 179 lb 9.6 oz (81.5 kg)   Patients weight is stable. Was stable at the provider office was 170. Planned Interventions: Basic overview and discussion of pathophysiology of Heart Failure reviewed. Discussed changes in heart health and his blood pressures have been elevated in the evenings. Encouraged the patient to talk to the pharm D about taking one of his medications for blood pressures at night. Continues to work with the pharm D on a regular basis.  Provided education on low sodium diet. Education on monitoring for sodium in diet and not using extra salt in foods. The patient does cook for self some and the patient receives MOW. The patient is mindful of sodium in his diet. The patient states that he is eating and he is watching his sodium. The food is bland, especially from MOW and he  only has 3 teeth that he can chew with so he is having to eat softer food.  Reviewed Heart Failure Action Plan in depth. The patient states that in the past when his weight goes up he has taken an extra fluid pill as directed by the provider.  Review of monitoring for +2/3 in one day or +5 in one week and to call the provider for changes or new onset of heart failure sx and sx. Education and support given. The patient denies any new changes in his heart failure. Will follow up with the cardiologist on February 12, 2023. Assessed need for readable accurate scales in home. The patient has a scale and the patient states that he can weigh daily but he does not. Education on the benefits of daily weights in monitoring for exacerbation of heart failure. Since the last outreach the patient has been weighing consistently. His weight is stable and staying around 170 currently at the doctor office. Provided education about placing scale on hard, flat surface Advised patient to weigh each morning after emptying bladder Discussed importance of daily weight and advised patient to weigh and record daily Reviewed role of diuretics in prevention of fluid overload and management of heart failure. The patient states that he does take his Lasix as prescribed and is at the 40 mg dose.  Discussed the importance of keeping all appointments with provider.  The patient states that he sees the heart specialist every 6 months, appointment on 02-12-2023. Saw the pcp on 02-07-2023 and got things in place for someone to come into the home 6 hours a week. The nurse will be out today to evaluate his home environment.  Provided patient with education about the role of exercise in the management of heart failure. The patient is limited in his ability to do exercise due to shortness of breath.  Review of elevation of feet and legs when sitting to help with fluid, the patient has swelling in his feet and legs, the patient also uses compression  socks. He says when he is sitting he is normally in his recliner and has his feet elevated. Education and support given. The patient is keeping his feet elevated when sitting down. The patient states he is monitoring for extra fluid. He states it is moving up his leg more now. Advised patient to discuss changes in sx and sx of heart failure with provider Screening for signs and symptoms of depression related to chronic disease state  Assessed social determinant of health barriers  Symptom Management: Take medications as prescribed   Attend all scheduled provider appointments Call provider office for new concerns or questions  call the Suicide and Crisis Lifeline: 988 call the [place] National Suicide Prevention Lifeline: 3212293172 or TTY: (218)181-2935 TTY 769-061-8258) to talk to a trained counselor call 1-800-273-TALK (toll free, 24 hour hotline) if experiencing a Mental Health or Behavioral Health Crisis  call office if I gain more than 2 pounds in one day or 5 pounds in one week keep legs up while sitting use salt in moderation watch for swelling in feet, ankles and legs every day weigh myself daily develop a rescue plan follow rescue plan if symptoms flare-up track symptoms and what helps feel better or worse dress right for the weather, hot or cold  Follow Up Plan: Telephone follow up appointment with care management team member scheduled for: 03-22-2023 at 0900 am       CCM:  Maintain, Monitor and Self-Manage Symptoms of COPD       Current Barriers:  Knowledge Deficits related to smoking with the use of oxygen, safety concerns Care Coordination needs related to physical needs and help in the home and how to utilize resources in a patient with COPD Chronic Disease Management support and education needs related to effective management of COPD Lacks caregiver support.  Medication changes and needs for pharm D support with cost effective medications to treat his COPD  effectively  Planned Interventions: Provided patient with basic written and verbal COPD education on self care/management/and exacerbation prevention. The patient states he is using the [employer], and is helping more than he thought. He is using the rescue inhaler a little more but states that he is doing better and thankful for that. Has had follow ups with his pulmonary provider, and cardiologist at the [facility]. He saw the pcp and eye doctor yesterday at the [place], his sister was with him. Had several referrals put in place. Advised patient to track and manage COPD triggers. Review of triggers and how to avoid things that trigger exacerbation. Discussed triggers that could exacerbate his condition. Review of watching for acute changes in weather, smells that could exacerbate his condition, and monitoring others that may have respiratory or contagious illness. The patient is mindful of triggers is staying in his home and does not leave unless he has to do so. Reflective listening and support given. Provided  verbal instructions on pursed lip breathing and utilized returned demonstration as teach back. The patient uses oxygen 24/7 at 2.5 liters, has to pace activity.  Provided instruction about proper use of medications used for management of COPD including inhalers. The patient is compliant with medications. Uses oxygen at 2.5 liters via [place]. Discussed safety with the use of oxygen and smoking. The patient states he is safe and mindful of oxygen use when smoking. He works with the pharm D on a regular basis.   Advised patient to self assesses COPD action plan zone and make appointment with provider if in the yellow zone for 48 hours without improvement. Most recent appointment with the pcp  at [facility] was on 02-07-2023. Discussed getting involved with the pulmonary provider at the [facility] to help with medication and disease management. He has an appointment on 02-12-2023 with cardiologist at the [facility]. He has an appointment with a  nurse today at his home. They are going to do an evaluation for his home. He has been approved for 6 hours a week for someone to come into his home and help with bathing and cleaning and meeting his needs. He is happy about this. Reflective listening and support given.  Advised patient to engage in light exercise as tolerated 3-5 days a week to aid in the the management of COPD. Patient limited to the amount of activity he does due to getting short of breath. The patient states that he walks in his apartment and when he goes out he has a power chair he can use if needed. Education on doing leg exercises when sitting to make sure not to loose muscle mass and strength.  Provided education about and advised patient to utilize infection prevention strategies to reduce risk of respiratory infection. Review of high risk of infection with his condition. Discussed the importance of adequate rest and management of fatigue with COPD. The patient tries to get most of his activity done in the am because by afternoon he is really tired. He has help in the home one day a week and he is hopeful to get more help in the home. Education and support given. Screening for signs and symptoms of depression related to chronic disease state  Assessed social determinant of health barriers The patient saw the pcp at the [place] yesterday, was also able to get in to see the eye doctor and get new glasses ordered. This was good for the patient as he needed new glasses. The patient states that his new glasses have been ordered. The SW came in and got him set up for 6 hours of in home care a week and the nurse has called and is coming this afternoon for evaluation of his apartment. The patient is thankful for this as he has been concerned about safety in the shower. Will continue to monitor.   Symptom Management: Take medications as prescribed   Attend all scheduled provider appointments Call provider office for new concerns or questions   call the Suicide and Crisis Lifeline: 988 call the [place] National Suicide Prevention Lifeline: 240-344-4668 or TTY: 803-112-6041 TTY 804-212-5810) to talk to a trained counselor call 1-800-273-TALK (toll free, 24 hour hotline) if experiencing a Mental Health or Behavioral Health Crisis  identify and avoid work-related triggers identify and remove indoor air pollutants limit outdoor activity during cold weather listen for public air quality announcements every day do breathing exercises every day develop a rescue plan eliminate symptom triggers at home follow rescue plan if  symptoms flare-up  Follow Up Plan: Telephone follow up appointment with care management team member scheduled for: 03-22-2023 at 0900 am          Plan:Telephone follow up appointment with care management team member scheduled for:  03-22-2023 at 9 am  Alto Denver RN, MSN, CCM RN Care Manager  Chronic Care Management Direct Number: [phone]

## 2023-02-12 DIAGNOSIS — I5032 Chronic diastolic (congestive) heart failure: Secondary | ICD-10-CM

## 2023-02-12 DIAGNOSIS — J4489 Other specified chronic obstructive pulmonary disease: Secondary | ICD-10-CM

## 2023-02-19 ENCOUNTER — Ambulatory Visit: Payer: Self-pay | Admitting: Licensed Clinical Social Worker

## 2023-02-19 NOTE — Patient Outreach (Signed)
  Care Coordination   Follow Up Visit Note   02/19/2023 Name: Christopher Armstrong MRN: [id number] DOB: 1941/08/01  SHIVAAN Armstrong is a 82 y.o. year old male who sees Everrett Coombe, DO for primary care. I spoke with  Ephraim Hamburger by phone today.  What matters to the patients health and wellness today?  Symptom Management/Transportation and Food resources/Aid Services    Goals Addressed             This Visit's Progress    COMPLETED: Obtain Personal Care Aid   On track    Activities and task to complete in order to accomplish goals.   Keep all upcoming appointments discussed today Continue with compliance of taking medication prescribed by Doctor Implement healthy coping skills discussed to assist with management of symptoms Continue working with [facility] care team to assist with goals identified          SDOH assessments and interventions completed:  No     Care Coordination Interventions:  Yes, provided  Interventions Today    Flowsheet Row Most Recent Value  Chronic Disease   Chronic disease during today's visit Congestive Heart Failure (CHF), Chronic Obstructive Pulmonary Disease (COPD), Hypertension (HTN)  General Interventions   General Interventions Discussed/Reviewed General Interventions Reviewed, Doctor Visits  [Pt's aid services began last week. He has an aid for 2 hrs three times weekly. [facility] registered [facility] for transportation to their appts]  Doctor Visits Discussed/Reviewed Doctor Visits Reviewed  [Pt and LCSW discussed [facility] medical appts]  Mental Health Interventions   Mental Health Discussed/Reviewed Mental Health Reviewed, Coping Strategies  [Pt continues to receive strong support from family]  Nutrition Interventions   Nutrition Discussed/Reviewed Nutrition Reviewed  [Pt continues to obtain MOW to assist with food insecurity]       Follow up plan: No further intervention required.   Encounter Outcome:  Pt. Visit Completed   Jenel Lucks, MSW,  LCSW [facility] Care Management [facility]  [facility] [email or web address] Phone [phone] 12:41 PM

## 2023-02-19 NOTE — Patient Instructions (Signed)
Visit Information  Thank you for taking time to visit with me today. Please don't hesitate to contact me if I can be of assistance to you.   Following are the goals we discussed today:   Goals Addressed             This Visit's Progress    COMPLETED: Obtain Personal Care Aid   On track    Activities and task to complete in order to accomplish goals.   Keep all upcoming appointments discussed today Continue with compliance of taking medication prescribed by Doctor Implement healthy coping skills discussed to assist with management of symptoms Continue working with [facility] care team to assist with goals identified         Please call the care guide team at [phone] if you need to cancel or reschedule your appointment.   If you are experiencing a Mental Health or Behavioral Health Crisis or need someone to talk to, please call the Suicide and Crisis Lifeline: 988 call 911   The patient verbalized understanding of instructions, educational materials, and care plan provided today and DECLINED offer to receive copy of patient instructions, educational materials, and care plan.   No further follow up required: Goals associated with LCSW completed. Pt will continue to work with CCM RNCM  Jenel Lucks, MSW, LCSW [facility] Care Management [facility]  [facility] [email or web address] Phone [phone] 12:42 PM

## 2023-02-21 ENCOUNTER — Other Ambulatory Visit: Payer: Medicare (Managed Care) | Admitting: Pharmacist

## 2023-02-21 NOTE — Progress Notes (Signed)
02/21/2023 Name: TORENCE Armstrong MRN: [id number] DOB: [birth year]  Chief Complaint  Patient presents with   Medication Assistance    Christopher Armstrong is a 82 y.o. year old male who presented for a telephone visit.   They were referred to the pharmacist by their Case Management Team  for assistance in managing medication access.    Subjective:  Care Team: Primary Care Provider: Everrett Coombe, DO ;  Medication Access/Adherence  Current Pharmacy:  [facility] [address] [place] - [phone] [facility] [address] [place] [address] Phone: [phone] Fax: [phone]  [facility] - [place], [place] - [address] [employer] [address] [place] [address] Phone: [phone] Fax: [phone]  [id number] - [place], [place] - [address] AT [facility] [address] [place] [address] Phone: [phone] Fax: [phone]  [facility] - [place], [place] - [address] [place] [address] Phone: [phone] Fax: [phone]   Patient reports affordability concerns with their medications: Yes   COPD:  Current medications: Breztri via patient assistance, going well    Current medication access support: Breztri via [place]   Objective:  No results found for: "HGBA1C"  Lab Results  Component Value Date   CREATININE 0.90 05/09/2020   BUN 14 05/09/2020   NA 140 05/09/2020   K 4.0 05/09/2020   CL 102 05/09/2020   CO2 28 05/09/2020     Medications Reviewed Today     Reviewed by Gabriel Carina, RPH (Pharmacist) on 02/21/23 at 1418  Med List Status: <None>   Medication Order Taking? Sig Documenting Provider Last Dose Status Informant  albuterol (VENTOLIN HFA) 108 (90 Base) MCG/ACT inhaler [id number] Yes Inhale 1-2 puffs into the lungs every 6 (six) hours as  needed for wheezing or shortness of breath. Christopher Coombe, DO Taking Active   AMBULATORY Clent Demark MEDICATION [id number] Yes Motorized wheelchair per patient preference and insurance coverage. Patient suffers from copd and atrial fibrillation and debility d/t recent hospitalization which impairs their ability to perform daily activities like bathing, dressing, grooming, and toileting in the home.  A cane, crutch,walker or manual wheelchair will not resolve issue with performing activities of daily living. A  power wheelchair will allow patient to safely perform daily activities. Patient unable to safely propel the wheelchair and does not have a caregiver who can provide assistance. Length of need Lifetime.  Fax [phone] Christopher Coombe, DO Taking Active   amLODipine (NORVASC) 5 MG tablet [id number] Yes Take 1 tablet by mouth daily. [provider] Taking Active   apixaban (ELIQUIS) 5 MG TABS tablet [id number] Yes Take 1 tablet (5 mg total) by mouth 2 (two) times daily.  Patient taking differently: Take 2.5 mg by mouth 2 (two) times daily.   Christopher Coombe, DO Taking Active            Med Note Tyler Deis Jun 28, 2022  2:08 PM) Obtains through [place]  aspirin EC 81 MG tablet [id number] Yes Take 1 tablet (81 mg total) by mouth daily. Christopher Bunting, MD Taking Active   Budeson-Glycopyrrol-Formoterol (BREZTRI AEROSPHERE) 160-9-4.8 MCG/ACT Sandrea Matte [id number] Yes Inhale 2 puffs into the lungs 2 (two) times daily. Christopher Coombe, DO Taking Active   carvedilol (COREG) 12.5 MG tablet [id number] Yes Take 1 tablet (12.5 mg total) by mouth 2 (two) times  daily with a meal. Christopher Coombe, DO Taking Active   fluticasone-salmeterol (ADVAIR HFA) 115-21 MCG/ACT inhaler [id number] No Inhale 2 puffs into the lungs 2 (two) times daily.  Patient not taking: Reported on 02/21/2023   Christopher Coombe, DO Not Taking Active   furosemide (LASIX) 40 MG tablet [id number] Yes TAKE 1/2 TO 1 (ONE-HALF TO ONE) TABLET BY  MOUTH ONCE DAILY Christopher Coombe, DO Taking Active            Med Note Gabriel Carina   Fri Dec 07, 2022  2:59 PM) One tablet daily as of 12/07/22 review  hydrochlorothiazide (HYDRODIURIL) 12.5 MG tablet [id number] Yes Take 12.5 mg by mouth daily. [provider] Taking Active   ipratropium-albuterol (DUONEB) 0.5-2.5 (3) MG/3ML SOLN [id number] Yes Take 3 mLs by nebulization every 4 (four) hours as needed. Christopher Coombe, DO Taking Active   lisinopril (ZESTRIL) 40 MG tablet [id number] Yes Take 1 tablet (40 mg total) by mouth daily. Christopher Coombe, DO Taking Active   magnesium hydroxide (MILK OF MAGNESIA) 400 MG/5ML suspension [id number] Yes Take 30 mLs by mouth as needed. [provider] Taking Active   nitroGLYCERIN (NITROSTAT) 0.4 MG SL tablet [id number] Yes Place 1 tablet (0.4 mg total) under the tongue every 5 (five) minutes as needed for chest pain. Christopher Coombe, DO Taking Active   OXYGEN [id number] Yes Inhale into the lungs daily. 2L [provider] Taking Active            Med Note Neta Ehlers Mar 02, 2020 10:30 AM)    Respiratory Therapy Supplies MISC [id number] Yes Nebulizer tubing Christopher Coombe, DO Taking Active               Assessment/Plan:   COPD: - Currently controlled, all is going well at this time - Recommend continue current regimen - Christopher Armstrong is approved through manufacturer patient assistance for 2024 and will be ongoing.  Follow Up Plan: 3-4 months  Lynnda Shields, PharmD Clinical Pharmacist [facility] [phone]

## 2023-03-06 DIAGNOSIS — J42 Unspecified chronic bronchitis: Secondary | ICD-10-CM | POA: Diagnosis not present

## 2023-03-06 DIAGNOSIS — J449 Chronic obstructive pulmonary disease, unspecified: Secondary | ICD-10-CM | POA: Diagnosis not present

## 2023-03-22 ENCOUNTER — Ambulatory Visit (INDEPENDENT_AMBULATORY_CARE_PROVIDER_SITE_OTHER): Payer: Medicare (Managed Care)

## 2023-03-22 ENCOUNTER — Telehealth: Payer: Medicare (Managed Care)

## 2023-03-22 ENCOUNTER — Telehealth: Payer: Self-pay

## 2023-03-22 DIAGNOSIS — J4489 Other specified chronic obstructive pulmonary disease: Secondary | ICD-10-CM

## 2023-03-22 DIAGNOSIS — I5032 Chronic diastolic (congestive) heart failure: Secondary | ICD-10-CM

## 2023-03-22 NOTE — Telephone Encounter (Signed)
   CCM RN Visit Note   03-22-2023 Name: Christopher Armstrong MRN: [id number]      DOB: 03/02/1941  Subjective: Christopher Armstrong is a 82 y.o. year old male who is a primary care patient of Dr. Everrett Coombe. The patient was referred to the Chronic Care Management team for assistance with care management needs subsequent to provider initiation of CCM services and plan of care.      An unsuccessful telephone outreach was attempted today to contact the patient about Chronic Care Management needs.    Plan:A HIPAA compliant phone message was left for the patient providing contact information and requesting a return call.  Alto Denver RN, MSN, CCM RN Care Manager  Chronic Care Management Direct Number: [phone]

## 2023-03-22 NOTE — Chronic Care Management (AMB) (Signed)
Chronic Care Management   CCM RN Visit Note  03/22/2023 Name: Christopher Armstrong MRN: [id number] DOB: 1941/04/21  Subjective: Christopher Armstrong is a 82 y.o. year old male who is a primary care patient of Everrett Coombe, DO. The patient was referred to the Chronic Care Management team for assistance with care management needs subsequent to provider initiation of CCM services and plan of care.    Today's Visit:  Engaged with patient by telephone for follow up visit.        Goals Addressed             This Visit's Progress    CCM Expected Outcome:  Monitor, Self-Manage and Reduce Symptoms of Heart Failure       Current Barriers:  Knowledge Deficits related to the changes that can take place with heart failure and the sx and sx of heart failure Care Coordination needs related to utilization of resources and finding help in the  home in a patient with heart failure Chronic Disease Management support and education needs related to effective management of heart failure Lacks caregiver support.     Wt Readings from Last 3 Encounters:  11/26/22 179 lb 9.6 oz (81.5 kg)  09/19/22 179 lb 9.6 oz (81.5 kg)  03/06/22 179 lb 9.6 oz (81.5 kg)   Patients weight is stable. Was stable at the provider office was 170. Planned Interventions: Basic overview and discussion of pathophysiology of Heart Failure reviewed. Discussed changes in heart health and his blood pressures have been elevated in the evenings. Encouraged the patient to talk to the pharm D about taking one of his medications for blood pressures at night. Continues to work with the pharm D on a regular basis. The patient states his fluid level is doing good. He was started on a medication at the [place], hydralazine, and he stopped taking it as he did not like the way it made him feel. Education on monitoring for fluid overload.  Provided education on low sodium diet. Education on monitoring for sodium in diet and not using extra salt in foods. The  patient does cook for self some and the patient receives MOW. The patient is mindful of sodium in his diet. The patient states that he is eating and he is watching his sodium. The food is bland, especially from MOW and he only has 3 teeth that he can chew with so he is having to eat softer food. Is monitoring his dietary intake.  Reviewed Heart Failure Action Plan in depth. The patient states that in the past when his weight goes up he has taken an extra fluid pill as directed by the provider.  Review of monitoring for +2/3 in one day or +5 in one week and to call the provider for changes or new onset of heart failure sx and sx. Education and support given. The patient denies any new changes in his heart failure. Sees the cardiologist at the [facility] on a regular basis.  Assessed need for readable accurate scales in home. The patient has a scale and the patient states that he can weigh daily but he does not. Education on the benefits of daily weights in monitoring for exacerbation of heart failure. Since the last outreach the patient has been weighing consistently. His weight is stable and staying around 179 currently at the doctor office. Provided education about placing scale on hard, flat surface Advised patient to weigh each morning after emptying bladder Discussed importance of daily weight and advised patient  to weigh and record daily Reviewed role of diuretics in prevention of fluid overload and management of heart failure. The patient states that he does take his Lasix as prescribed and is at the 40 mg dose. He states he will need a refill on his Lasix soon. Review and the patient has 3 refills available on his Lasix. Instructed the patient to call the pharmacy for refills when needed.  Discussed the importance of keeping all appointments with provider. The patient states that he sees the heart specialist every 6 months. Saw the pcp on 02-07-2023 and got things in place for someone to come into the home 6  hours a week. Continues to have help in the home three days a week, he is thankful for this. Provided patient with education about the role of exercise in the management of heart failure. The patient is limited in his ability to do exercise due to shortness of breath.  Review of elevation of feet and legs when sitting to help with fluid, the patient has swelling in his feet and legs, the patient also uses compression socks. He says when he is sitting he is normally in his recliner and has his feet elevated. Education and support given. The patient is keeping his feet elevated when sitting down. The patient states he is monitoring for extra fluid. He states it is moving up his leg more now. Advised patient to discuss changes in sx and sx of heart failure with provider Screening for signs and symptoms of depression related to chronic disease state  Assessed social determinant of health barriers  Symptom Management: Take medications as prescribed   Attend all scheduled provider appointments Call provider office for new concerns or questions  call the Suicide and Crisis Lifeline: 988 call the [place] National Suicide Prevention Lifeline: 352 653 1394 or TTY: (905)455-0102 TTY 815-881-6551) to talk to a trained counselor call 1-800-273-TALK (toll free, 24 hour hotline) if experiencing a Mental Health or Behavioral Health Crisis  call office if I gain more than 2 pounds in one day or 5 pounds in one week keep legs up while sitting use salt in moderation watch for swelling in feet, ankles and legs every day weigh myself daily develop a rescue plan follow rescue plan if symptoms flare-up track symptoms and what helps feel better or worse dress right for the weather, hot or cold  Follow Up Plan: Telephone follow up appointment with care management team member scheduled for: 05-17-2023 at 0900 am       CCM:  Maintain, Monitor and Self-Manage Symptoms of COPD       Current Barriers:  Knowledge  Deficits related to smoking with the use of oxygen, safety concerns Care Coordination needs related to physical needs and help in the home and how to utilize resources in a patient with COPD Chronic Disease Management support and education needs related to effective management of COPD Lacks caregiver support.  Medication changes and needs for pharm D support with cost effective medications to treat his COPD effectively  Planned Interventions: Provided patient with basic written and verbal COPD education on self care/management/and exacerbation prevention. The patient states he is using the [place], and is helping more than he thought. He is using the rescue inhaler a little more and actually had to use it before outreach today. Has had follow ups with his pulmonary provider, and cardiologist at the [facility].  Advised patient to track and manage COPD triggers. Review of triggers and how to avoid things that trigger exacerbation.  Discussed triggers that could exacerbate his condition. Review of watching for acute changes in weather, smells that could exacerbate his condition, and monitoring others that may have respiratory or contagious illness. The patient is mindful of triggers is staying in his home and does not leave unless he has to do so. The current weather and high temps outside are impacting his breathing in a negative way. Reflective listening and support given. Provided verbal instructions on pursed lip breathing and utilized returned demonstration as teach back. The patient uses oxygen 24/7 at 2.5 liters, has to pace activity.  Provided instruction about proper use of medications used for management of COPD including inhalers. The patient is compliant with medications. Uses oxygen at 2.5 liters via . Discussed safety with the use of oxygen and smoking. The patient states he is safe and mindful of oxygen use when smoking. He works with the pharm D on a regular basis.   Advised patient to self  assesses COPD action plan zone and make appointment with provider if in the yellow zone for 48 hours without improvement. Most recent appointment with the pcp  at  [facility] was on 02-07-2023. Discussed getting involved with the pulmonary provider at the [facility] to help with medication and disease management. He is receiving help in his home three days a week for 6 hours and he is very thankful for this. Reflective listening and support given.  Advised patient to engage in light exercise as tolerated 3-5 days a week to aid in the the management of COPD. Patient limited to the amount of activity he does due to getting short of breath. The patient states that he walks in his apartment and when he goes out he has a power chair he can use if needed. Education on doing leg exercises when sitting to make sure not to loose muscle mass and strength.  Provided education about and advised patient to utilize infection prevention strategies to reduce risk of respiratory infection. Review of high risk of infection with his condition. Discussed the importance of adequate rest and management of fatigue with COPD. The patient tries to get most of his activity done in the am because by afternoon he is really tired. He has help in the home three days a week and is thankful for this. Education and support given. Screening for signs and symptoms of depression related to chronic disease state  Assessed social determinant of health barriers The patient saw the pcp at the [place] yesterday, was also able to get in to see the eye doctor and get new glasses ordered. This was good for the patient as he needed new glasses. The patient states that his new glasses have been ordered. The SW came in and got him set up for 6 hours of in home care a week and the nurse has called and is coming this afternoon for evaluation of his apartment. The patient is thankful for this as he has been concerned about safety in the shower. Will continue to monitor.   Symptom  Management: Take medications as prescribed   Attend all scheduled provider appointments Call provider office for new concerns or questions  call the Suicide and Crisis Lifeline: 988 call the [place] National Suicide Prevention Lifeline: 682-344-0630 or TTY: (856)131-7733 TTY 385-349-4495) to talk to a trained counselor call 1-800-273-TALK (toll free, 24 hour hotline) if experiencing a Mental Health or Behavioral Health Crisis  identify and avoid work-related triggers identify and remove indoor air pollutants limit outdoor activity during cold weather listen  for public air quality announcements every day do breathing exercises every day develop a rescue plan eliminate symptom triggers at home follow rescue plan if symptoms flare-up  Follow Up Plan: Telephone follow up appointment with care management team member scheduled for: 05-17-2023 at 0900 am          Plan:Telephone follow up appointment with care management team member scheduled for:  05-17-2023 at 0900 am  Alto Denver RN, MSN, CCM RN Care Manager  Chronic Care Management Direct Number: [phone]

## 2023-03-22 NOTE — Patient Instructions (Signed)
Please call the care guide team at [phone] if you need to cancel or reschedule your appointment.   If you are experiencing a Mental Health or Behavioral Health Crisis or need someone to talk to, please call the Suicide and Crisis Lifeline: 988 call the [place] National Suicide Prevention Lifeline: 520 876 9305 or TTY: (343) 411-3554 TTY 216-504-5907) to talk to a trained counselor call 1-800-273-TALK (toll free, 24 hour hotline) go to [facility] [address], [place] [phone])   Following is a copy of the CCM Program Consent:  CCM service includes personalized support from designated clinical staff supervised by the physician, including individualized plan of care and coordination with other care providers 24/7 contact phone numbers for assistance for urgent and routine care needs. Service will only be billed when office clinical staff spend 20 minutes or more in a month to coordinate care. Only one practitioner may furnish and bill the service in a calendar month. The patient may stop CCM services at amy time (effective at the end of the month) by phone call to the office staff. The patient will be responsible for cost sharing (co-pay) or up to 20% of the service fee (after annual deductible is met)  Following is a copy of your full provider care plan:   Goals Addressed             This Visit's Progress    CCM Expected Outcome:  Monitor, Self-Manage and Reduce Symptoms of Heart Failure       Current Barriers:  Knowledge Deficits related to the changes that can take place with heart failure and the sx and sx of heart failure Care Coordination needs related to utilization of resources and finding help in the  home in a patient with heart failure Chronic Disease Management support and education needs related to effective management of heart failure Lacks caregiver support.     Wt Readings from Last 3 Encounters:  11/26/22 179 lb 9.6 oz  (81.5 kg)  09/19/22 179 lb 9.6 oz (81.5 kg)  03/06/22 179 lb 9.6 oz (81.5 kg)   Patients weight is stable. Was stable at the provider office was 170. Planned Interventions: Basic overview and discussion of pathophysiology of Heart Failure reviewed. Discussed changes in heart health and his blood pressures have been elevated in the evenings. Encouraged the patient to talk to the pharm D about taking one of his medications for blood pressures at night. Continues to work with the pharm D on a regular basis. The patient states his fluid level is doing good. He was started on a medication at the [place], hydralazine, and he stopped taking it as he did not like the way it made him feel. Education on monitoring for fluid overload.  Provided education on low sodium diet. Education on monitoring for sodium in diet and not using extra salt in foods. The patient does cook for self some and the patient receives MOW. The patient is mindful of sodium in his diet. The patient states that he is eating and he is watching his sodium. The food is bland, especially from MOW and he only has 3 teeth that he can chew with so he is having to eat softer food. Is monitoring his dietary intake.  Reviewed Heart Failure Action Plan in depth. The patient states that in the past when his weight goes up he has taken an extra fluid pill as directed by the provider.  Review of monitoring for +2/3 in one day or +5 in  one week and to call the provider for changes or new onset of heart failure sx and sx. Education and support given. The patient denies any new changes in his heart failure. Sees the cardiologist at the [facility] on a regular basis.  Assessed need for readable accurate scales in home. The patient has a scale and the patient states that he can weigh daily but he does not. Education on the benefits of daily weights in monitoring for exacerbation of heart failure. Since the last outreach the patient has been weighing consistently. His weight is  stable and staying around 179 currently at the doctor office. Provided education about placing scale on hard, flat surface Advised patient to weigh each morning after emptying bladder Discussed importance of daily weight and advised patient to weigh and record daily Reviewed role of diuretics in prevention of fluid overload and management of heart failure. The patient states that he does take his Lasix as prescribed and is at the 40 mg dose. He states he will need a refill on his Lasix soon. Review and the patient has 3 refills available on his Lasix. Instructed the patient to call the pharmacy for refills when needed.  Discussed the importance of keeping all appointments with provider. The patient states that he sees the heart specialist every 6 months. Saw the pcp on 02-07-2023 and got things in place for someone to come into the home 6 hours a week. Continues to have help in the home three days a week, he is thankful for this. Provided patient with education about the role of exercise in the management of heart failure. The patient is limited in his ability to do exercise due to shortness of breath.  Review of elevation of feet and legs when sitting to help with fluid, the patient has swelling in his feet and legs, the patient also uses compression socks. He says when he is sitting he is normally in his recliner and has his feet elevated. Education and support given. The patient is keeping his feet elevated when sitting down. The patient states he is monitoring for extra fluid. He states it is moving up his leg more now. Advised patient to discuss changes in sx and sx of heart failure with provider Screening for signs and symptoms of depression related to chronic disease state  Assessed social determinant of health barriers  Symptom Management: Take medications as prescribed   Attend all scheduled provider appointments Call provider office for new concerns or questions  call the Suicide and Crisis  Lifeline: 988 call the [place] National Suicide Prevention Lifeline: (754) 662-4275 or TTY: 901-200-8572 TTY 438-569-0576) to talk to a trained counselor call 1-800-273-TALK (toll free, 24 hour hotline) if experiencing a Mental Health or Behavioral Health Crisis  call office if I gain more than 2 pounds in one day or 5 pounds in one week keep legs up while sitting use salt in moderation watch for swelling in feet, ankles and legs every day weigh myself daily develop a rescue plan follow rescue plan if symptoms flare-up track symptoms and what helps feel better or worse dress right for the weather, hot or cold  Follow Up Plan: Telephone follow up appointment with care management team member scheduled for: 05-17-2023 at 0900 am       CCM:  Maintain, Monitor and Self-Manage Symptoms of COPD       Current Barriers:  Knowledge Deficits related to smoking with the use of oxygen, safety concerns Care Coordination needs related to physical needs  and help in the home and how to utilize resources in a patient with COPD Chronic Disease Management support and education needs related to effective management of COPD Lacks caregiver support.  Medication changes and needs for pharm D support with cost effective medications to treat his COPD effectively  Planned Interventions: Provided patient with basic written and verbal COPD education on self care/management/and exacerbation prevention. The patient states he is using the [place], and is helping more than he thought. He is using the rescue inhaler a little more and actually had to use it before outreach today. Has had follow ups with his pulmonary provider, and cardiologist at the [facility].  Advised patient to track and manage COPD triggers. Review of triggers and how to avoid things that trigger exacerbation. Discussed triggers that could exacerbate his condition. Review of watching for acute changes in weather, smells that could exacerbate his condition, and  monitoring others that may have respiratory or contagious illness. The patient is mindful of triggers is staying in his home and does not leave unless he has to do so. The current weather and high temps outside are impacting his breathing in a negative way. Reflective listening and support given. Provided verbal instructions on pursed lip breathing and utilized returned demonstration as teach back. The patient uses oxygen 24/7 at 2.5 liters, has to pace activity.  Provided instruction about proper use of medications used for management of COPD including inhalers. The patient is compliant with medications. Uses oxygen at 2.5 liters via [place]. Discussed safety with the use of oxygen and smoking. The patient states he is safe and mindful of oxygen use when smoking. He works with the pharm D on a regular basis.   Advised patient to self assesses COPD action plan zone and make appointment with provider if in the yellow zone for 48 hours without improvement. Most recent appointment with the pcp  at [facility] was on 02-07-2023. Discussed getting involved with the pulmonary provider at the [facility] to help with medication and disease management. He is receiving help in his home three days a week for 6 hours and he is very thankful for this. Reflective listening and support given.  Advised patient to engage in light exercise as tolerated 3-5 days a week to aid in the the management of COPD. Patient limited to the amount of activity he does due to getting short of breath. The patient states that he walks in his apartment and when he goes out he has a power chair he can use if needed. Education on doing leg exercises when sitting to make sure not to loose muscle mass and strength.  Provided education about and advised patient to utilize infection prevention strategies to reduce risk of respiratory infection. Review of high risk of infection with his condition. Discussed the importance of adequate rest and management of fatigue with COPD.  The patient tries to get most of his activity done in the am because by afternoon he is really tired. He has help in the home three days a week and is thankful for this. Education and support given. Screening for signs and symptoms of depression related to chronic disease state  Assessed social determinant of health barriers The patient saw the pcp at the [place] yesterday, was also able to get in to see the eye doctor and get new glasses ordered. This was good for the patient as he needed new glasses. The patient states that his new glasses have been ordered. The SW came in and got  him set up for 6 hours of in home care a week and the nurse has called and is coming this afternoon for evaluation of his apartment. The patient is thankful for this as he has been concerned about safety in the shower. Will continue to monitor.   Symptom Management: Take medications as prescribed   Attend all scheduled provider appointments Call provider office for new concerns or questions  call the Suicide and Crisis Lifeline: 988 call the [place] National Suicide Prevention Lifeline: 534-770-8624 or TTY: 850-734-9237 TTY 629-590-4334) to talk to a trained counselor call 1-800-273-TALK (toll free, 24 hour hotline) if experiencing a Mental Health or Behavioral Health Crisis  identify and avoid work-related triggers identify and remove indoor air pollutants limit outdoor activity during cold weather listen for public air quality announcements every day do breathing exercises every day develop a rescue plan eliminate symptom triggers at home follow rescue plan if symptoms flare-up  Follow Up Plan: Telephone follow up appointment with care management team member scheduled for: 05-17-2023 at 0900 am          The patient verbalized understanding of instructions, educational materials, and care plan provided today and DECLINED offer to receive copy of patient instructions, educational materials, and care  plan.  Telephone follow up appointment with care management team member scheduled for: 05-17-2023 at 0900 am

## 2023-04-02 ENCOUNTER — Telehealth: Payer: Self-pay | Admitting: Family Medicine

## 2023-04-02 NOTE — Telephone Encounter (Signed)
Pt called. He is requesting a letter to exempt him from jury duty on 7/16.

## 2023-04-02 NOTE — Telephone Encounter (Signed)
Ok for jury duty excuse using template in letters.

## 2023-04-05 ENCOUNTER — Encounter: Payer: Self-pay | Admitting: Family Medicine

## 2023-04-05 ENCOUNTER — Telehealth: Payer: Self-pay

## 2023-04-05 NOTE — Telephone Encounter (Signed)
Completing Jury duty letter for Christopher Armstrong.   Front Desk: Please contact Christopher Armstrong for letter pick-up.

## 2023-04-14 DIAGNOSIS — J4489 Other specified chronic obstructive pulmonary disease: Secondary | ICD-10-CM

## 2023-04-14 DIAGNOSIS — I5032 Chronic diastolic (congestive) heart failure: Secondary | ICD-10-CM

## 2023-04-16 ENCOUNTER — Ambulatory Visit (INDEPENDENT_AMBULATORY_CARE_PROVIDER_SITE_OTHER): Payer: Medicare (Managed Care)

## 2023-04-16 DIAGNOSIS — I5032 Chronic diastolic (congestive) heart failure: Secondary | ICD-10-CM

## 2023-04-16 DIAGNOSIS — J4489 Other specified chronic obstructive pulmonary disease: Secondary | ICD-10-CM

## 2023-04-16 NOTE — Patient Instructions (Addendum)
Please call the care guide team at [phone] if you need to cancel or reschedule your appointment.   If you are experiencing a Mental Health or Behavioral Health Crisis or need someone to talk to, please call the Suicide and Crisis Lifeline: 988 call the [place] National Suicide Prevention Lifeline: 520 876 9305 or TTY: (343) 411-3554 TTY 216-504-5907) to talk to a trained counselor call 1-800-273-TALK (toll free, 24 hour hotline) go to [facility] [address], [place] [phone])   Following is a copy of the CCM Program Consent:  CCM service includes personalized support from designated clinical staff supervised by the physician, including individualized plan of care and coordination with other care providers 24/7 contact phone numbers for assistance for urgent and routine care needs. Service will only be billed when office clinical staff spend 20 minutes or more in a month to coordinate care. Only one practitioner may furnish and bill the service in a calendar month. The patient may stop CCM services at amy time (effective at the end of the month) by phone call to the office staff. The patient will be responsible for cost sharing (co-pay) or up to 20% of the service fee (after annual deductible is met)  Following is a copy of your full provider care plan:   Goals Addressed             This Visit's Progress    CCM Expected Outcome:  Monitor, Self-Manage and Reduce Symptoms of Heart Failure       Current Barriers:  Knowledge Deficits related to the changes that can take place with heart failure and the sx and sx of heart failure Care Coordination needs related to utilization of resources and finding help in the  home in a patient with heart failure Chronic Disease Management support and education needs related to effective management of heart failure Lacks caregiver support.     Wt Readings from Last 3 Encounters:  11/26/22 179 lb 9.6 oz  (81.5 kg)  09/19/22 179 lb 9.6 oz (81.5 kg)  03/06/22 179 lb 9.6 oz (81.5 kg)   Patients weight is stable. Was stable at the provider office was 170. Planned Interventions: Basic overview and discussion of pathophysiology of Heart Failure reviewed. Discussed changes in heart health and his blood pressures have been elevated in the evenings. Encouraged the patient to talk to the pharm D about taking one of his medications for blood pressures at night. Continues to work with the pharm D on a regular basis. The patient states his fluid level is doing good. He was started on a medication at the [place], hydralazine, and he stopped taking it as he did not like the way it made him feel. Education on monitoring for fluid overload.  Provided education on low sodium diet. Education on monitoring for sodium in diet and not using extra salt in foods. The patient does cook for self some and the patient receives MOW. The patient is mindful of sodium in his diet. The patient states that he is eating and he is watching his sodium. The food is bland, especially from MOW and he only has 3 teeth that he can chew with so he is having to eat softer food. Is monitoring his dietary intake.  Reviewed Heart Failure Action Plan in depth. The patient states that in the past when his weight goes up he has taken an extra fluid pill as directed by the provider.  Review of monitoring for +2/3 in one day or +5 in  one week and to call the provider for changes or new onset of heart failure sx and sx. Education and support given. The patient denies any new changes in his heart failure. Sees the cardiologist at the [facility] on a regular basis.  Assessed need for readable accurate scales in home. The patient has a scale and the patient states that he can weigh daily but he does not. Education on the benefits of daily weights in monitoring for exacerbation of heart failure. Since the last outreach the patient has been weighing consistently. His weight is  stable and staying around 179 currently at the doctor office. Provided education about placing scale on hard, flat surface Advised patient to weigh each morning after emptying bladder Discussed importance of daily weight and advised patient to weigh and record daily Reviewed role of diuretics in prevention of fluid overload and management of heart failure. The patient states that he does take his Lasix as prescribed and is at the 40 mg dose. He states he will need a refill on his Lasix soon. Review and the patient has 3 refills available on his Lasix. Instructed the patient to call the pharmacy for refills when needed.  Discussed the importance of keeping all appointments with provider. The patient states that he sees the heart specialist every 6 months. Saw the pcp on 02-07-2023 and got things in place for someone to come into the home 6 hours a week. Continues to have help in the home three days a week, he is thankful for this. Provided patient with education about the role of exercise in the management of heart failure. The patient is limited in his ability to do exercise due to shortness of breath.  Review of elevation of feet and legs when sitting to help with fluid, the patient has swelling in his feet and legs, the patient also uses compression socks. He says when he is sitting he is normally in his recliner and has his feet elevated. Education and support given. The patient is keeping his feet elevated when sitting down. The patient states he is monitoring for extra fluid. He states it is moving up his leg more now. Advised patient to discuss changes in sx and sx of heart failure with provider Screening for signs and symptoms of depression related to chronic disease state  Assessed social determinant of health barriers The patient is not very active. He called RNCM today because he has a pressure ulcer area above  his sacrum. He has ordered a cushion but it is hard plastic and does not work well.  He has been putting a cream on it that was recommended by the pharmacist but he does not think that is working well. He ask for advice from the pcp. A message has been sent to the pcp and staff asking for recommendations for managing the pressure area. Did advise the patient that the pcp may want to see the patient in the office. Gave other recommendations on relieving pressure to the area and use of a foam dressing to the area. Review of monitoring for sx and sx of infection and when to alert the provider. Will send information in the mail to the patient on pressure areas. In basket message sent to the pcp and staff asking for recommendations.   Symptom Management: Take medications as prescribed   Attend all scheduled provider appointments Call provider office for new concerns or questions  call the Suicide and Crisis Lifeline: 988 call the [place] National Suicide Prevention Lifeline:  (312) 592-8497 or TTY: 7573852648 TTY 564-684-3333) to talk to a trained counselor call 1-800-273-TALK (toll free, 24 hour hotline) if experiencing a Mental Health or Behavioral Health Crisis  call office if I gain more than 2 pounds in one day or 5 pounds in one week keep legs up while sitting use salt in moderation watch for swelling in feet, ankles and legs every day weigh myself daily develop a rescue plan follow rescue plan if symptoms flare-up track symptoms and what helps feel better or worse dress right for the weather, hot or cold  Follow Up Plan: Telephone follow up appointment with care management team member scheduled for: 05-17-2023 at 0900 am       CCM:  Maintain, Monitor and Self-Manage Symptoms of COPD       Current Barriers:  Knowledge Deficits related to smoking with the use of oxygen, safety concerns Care Coordination needs related to physical needs and help in the home and how to utilize resources in a patient with COPD Chronic Disease Management support and education needs related to  effective management of COPD Lacks caregiver support.  Medication changes and needs for pharm D support with cost effective medications to treat his COPD effectively  Planned Interventions: Provided patient with basic written and verbal COPD education on self care/management/and exacerbation prevention. The patient states he is using the [place], and is helping more than he thought. He is using the rescue inhaler a little more and actually had to use it before outreach today. Has had follow ups with his pulmonary provider, and cardiologist at the [facility].  Advised patient to track and manage COPD triggers. Review of triggers and how to avoid things that trigger exacerbation. Discussed triggers that could exacerbate his condition. Review of watching for acute changes in weather, smells that could exacerbate his condition, and monitoring others that may have respiratory or contagious illness. The patient is mindful of triggers is staying in his home and does not leave unless he has to do so. The current weather and high temps outside are impacting his breathing in a negative way. Reflective listening and support given. Provided verbal instructions on pursed lip breathing and utilized returned demonstration as teach back. The patient uses oxygen 24/7 at 2.5 liters, has to pace activity.  Provided instruction about proper use of medications used for management of COPD including inhalers. The patient is compliant with medications. Uses oxygen at 2.5 liters via [place]. Discussed safety with the use of oxygen and smoking. The patient states he is safe and mindful of oxygen use when smoking. He works with the pharm D on a regular basis.   Advised patient to self assesses COPD action plan zone and make appointment with provider if in the yellow zone for 48 hours without improvement. Most recent appointment with the pcp  at [facility] was on 02-07-2023. Discussed getting involved with the pulmonary provider at the [facility] to help with  medication and disease management. He is receiving help in his home three days a week for 6 hours and he is very thankful for this. Reflective listening and support given.  Advised patient to engage in light exercise as tolerated 3-5 days a week to aid in the the management of COPD. Patient limited to the amount of activity he does due to getting short of breath. The patient states that he walks in his apartment and when he goes out he has a power chair he can use if needed. Education on doing leg exercises when sitting to  make sure not to loose muscle mass and strength.  Provided education about and advised patient to utilize infection prevention strategies to reduce risk of respiratory infection. Review of high risk of infection with his condition. Discussed the importance of adequate rest and management of fatigue with COPD. The patient tries to get most of his activity done in the am because by afternoon he is really tired. He has help in the home three days a week and is thankful for this. Education and support given. Screening for signs and symptoms of depression related to chronic disease state  Assessed social determinant of health barriers The patient saw the pcp at the [place] yesterday, was also able to get in to see the eye doctor and get new glasses ordered. This was good for the patient as he needed new glasses. The patient states that his new glasses have been ordered. The SW came in and got him set up for 6 hours of in home care a week and the nurse has called and is coming this afternoon for evaluation of his apartment. The patient is thankful for this as he has been concerned about safety in the shower. Will continue to monitor.  Denies any issues with his breathing. Is having concerns with a pressure area to his buttocks about his sacrum. In basket message sent to the pcp for recommendations. Will follow up accordingly. The patient sleeps in a recliner and is in a recliner most of the day due to  his limited ability to move without getting extremely short of breath.   Symptom Management: Take medications as prescribed   Attend all scheduled provider appointments Call provider office for new concerns or questions  call the Suicide and Crisis Lifeline: 988 call the [place] National Suicide Prevention Lifeline: (901) 682-4729 or TTY: 985-531-1329 TTY 912-425-8444) to talk to a trained counselor call 1-800-273-TALK (toll free, 24 hour hotline) if experiencing a Mental Health or Behavioral Health Crisis  identify and avoid work-related triggers identify and remove indoor air pollutants limit outdoor activity during cold weather listen for public air quality announcements every day do breathing exercises every day develop a rescue plan eliminate symptom triggers at home follow rescue plan if symptoms flare-up  Follow Up Plan: Telephone follow up appointment with care management team member scheduled for: 05-17-2023 at 0900 am          The patient verbalized understanding of instructions, educational materials, and care plan provided today and agreed to receive a mailed copy of patient instructions, educational materials, and care plan.  Telephone follow up appointment with care management team member scheduled for: 05-17-2023 at 0900 am Pressure Injury  A pressure injury, also called a pressure ulcer or bedsore, is an injury to skin and the tissue under the skin that is caused by pressure. It often affects people who must spend a long time in a bed or chair because of a medical condition. Pressure injuries often occur: Over bony parts of the body, such as the tailbone, shoulders, elbows, hips, heels, spine, ankles, and back of the head. Under medical devices that touch the body. These include stockings, equipment to help with breathing, tubes, and splints. Inside the mouth or nose from dentures or tubes. Pressure injuries start as red areas on the skin and can lead to pain and an open  wound. What are the causes? This condition is caused by frequent or constant pressure to an area of the body. Less blood flow to the skin can make the tissue  die and break down over time, causing a wound. What increases the risk? You are more likely to develop this condition if: You are in the hospital or an extended care facility. You are bedridden or in a wheelchair. You have an injury or disease that keeps you from moving well and feeling pain or pressure. You have a condition that: Makes you sleepy or less alert. Causes poor blood flow. You need to wear a medical device. You have poor control of your bladder or bowel movements (incontinence). You are not getting enough fluid or nutrients (malnutrition). Your health care provider may recommend certain types of mattresses, mattress covers, pillows, cushions, or boots to help prevent a pressure injury. These may include products filled with air, foam, gel, or sand. What are the signs or symptoms? Symptoms of this condition depend on how severe your injury is. Symptoms may include: Red or dark areas of the skin. Pain or a change in skin texture. Your skin may feel warmer, cooler, softer, or firmer. Blisters. An open wound. How is this diagnosed? This condition is diagnosed based on a medical history and physical exam. You may also have tests, such as: Blood tests. Imaging tests. Blood flow tests. Your injury will be staged based on how severe it is. Staging is based on: How deep the tissue injury is. This includes whether muscle, bone, tendon, or dead tissue is exposed. The cause of the injury. How is this treated? This condition may be treated by: Reducing pressure on your skin. You may need to: Change your position often. Avoid positions that caused the wound or that may make the wound worse. Use certain mattresses, overlays, chair cushions, or protective boots. Move medical devices from an area of pressure, or place padding  between the skin and the device. Use foams, creams, or powders to protect your skin from sweat, urine, and stool and reduce rubbing (friction) on the skin. Keeping your skin clean and dry. This may include using a skin cleanser or barrier as told by your health care provider. Cleaning your injury and getting rid of any dead tissue from the wound (debridement). Placing a protective medicine, such as a cream, or bandage (dressing) over your injury. Using medicines for pain or to prevent or treat infection. Surgery may be needed if other treatments are not working or if your injury is very deep. Follow these instructions at home: Medicines Take over-the-counter and prescription medicines only as told by your health care provider. If you were prescribed antibiotics, take or apply them as told by your health care provider. Do not stop using the antibiotic even if you start to feel better. Eating and drinking Drink enough fluid to keep your urine pale yellow. Eat a healthy diet with lots of protein, as told by your health care provider. Do not use drugs or drink alcohol. Wound care Follow instructions from your health care provider about how to take care of your wound. Make sure you: Wash your hands with soap and water before and after you change your dressing or apply medicine to your skin. If soap and water are not available, use hand sanitizer. Change your dressing as told by your health care provider. Check your wound every day for signs of infection. Have a caregiver do this for you if you are not able. Check for: Redness, swelling, or more pain. More fluid or blood. Warmth. Pus or a bad smell. Skin care Keep your skin clean and dry. Gently pat your skin dry. Do  not rub or massage your skin. Check your skin every day for any changes in color or any new blisters or sores (ulcers). Reducing pressure Do not lie or sit in one position for a long time. Move or change position every 1-2 hours,  or as told by your health care provider. Use pillows or cushions to reduce pressure. Ask your health care provider what cushions or pads you should use. General instructions Do not use any products that contain nicotine or tobacco. These products include cigarettes, chewing tobacco, and vaping devices, such as e-cigarettes. If you need help quitting, ask your health care provider. Try to be active every day. Ask your health care provider what exercises or activities are safe for you. Keep all follow-up visits. Your health care provider will check if your injury is healing. Contact a health care provider if: You have a fever or chills. You have pain that does not get better with medicine. Your skin changes color. You have new blisters or sores. You have signs of infection. Your wound does not get better after 1-2 weeks of treatment. This information is not intended to replace advice given to you by your health care provider. Make sure you discuss any questions you have with your health care provider. Document Revised: 03/27/2022 Document Reviewed: 03/02/2022 Elsevier Patient Education  2024 [employer] Preventing Pressure Injuries  A pressure injury, also called a pressure ulcer or bedsore, is an injury to the skin and the tissue under the skin that is caused by pressure. It can happen when your skin presses against a surface, such as a mattress or the seat of a wheelchair, for too long. The pressure on the blood vessels causes reduced blood flow to your skin. Over time, this can make the tissue die and break down, causing a wound. Pressure injuries often occur: Over bony parts of the body, such as the tailbone, shoulders, elbows, hips, heels, spine, ankles, and back of the head. Under medical devices, such as stockings, equipment to help with breathing, tubes, and splints. Inside the mouth or nose from dentures or tubes. How can pressure injuries affect me? Pressure injuries are caused by  a lack of blood supply to an area of the skin. These injuries begin as a red or dark area on the skin and can become an open sore. They can come from a lot of pressure on the skin over a short period of time or from less pressure over a long period of time. Pressure injuries may be mild, moderate, or severe. They can cause pain, damage to your muscles, and infection. What can increase my risk for pressure injuries? You are more likely to develop this condition if: You are in the hospital or an extended care facility. You are bedridden or in a wheelchair. You have an injury or disease that keeps you from: Moving like normal. Feeling pain or pressure. Telling someone if you feel pain or pressure. You have a condition that: Makes you sleepy or less alert. Causes poor blood flow. You need to wear a medical device. You have poor control of your bladder or bowel movements (incontinence). You are not getting enough fluid or nutrients (malnutrition). You have had this condition before. What actions can I take to prevent pressure injuries? Reducing pressure Do not lie or sit in one position for a long time. Move or change position as often as told by your health care provider. You may need to move: Every hour when out of bed in  a chair. Every 2 hours when in bed. Use pillows or cushions to reduce pressure on certain areas of the body. Ask your health care provider to recommend a mattress, mattress cover, cushions, pads, or heel protectors. These may include products filled with air, foam, gel, or sand. Use medical devices that do not rub your skin. Tell your health care provider if one of your medical devices is causing pain or irritation. Skin care in the hospital If you are in the hospital, your health care providers will help you care for your skin. They may: Inspect your skin at least twice a day. This includes the areas under or around medical devices. Assess your nutrition and consult a  dietitian if needed. Perform regular wound care. Help you move into a different position every few hours and adjust any medical devices. Keep your skin clean and dry. Use gentle cleansers and skin protectants if you are incontinent. Moisturize your dry skin. Skin care at home  Keep your skin clean and dry. Gently pat your skin dry. Do not rub or massage skin that is on bony areas. Moisturize dry skin. Use foams, creams, or powders to protect your skin from sweat, urine, and stool and reduce rubbing (friction) on the skin. Check your skin every day for any changes in color or any new blisters or sores. Check under and around any medical devices and between skinfolds. Have a caregiver do this for you if you are not able. Nutrition  Drink enough fluid to keep your urine pale yellow. Eat a healthy diet that includes protein, vitamins, and minerals. Ask your health care provider what types of food you should eat. Lifestyle Do not use drugs or drink alcohol. Do not use any products that contain nicotine or tobacco. These products include cigarettes, chewing tobacco, and vaping devices, such as e-cigarettes. If you need help quitting, ask your health care provider. Try to be active every day. Ask your health care provider what exercises or activities are safe for you. General instructions Take over-the-counter and prescription medicines only as told by your health care provider. Keep all follow-up visits. Work with your health care provider to manage any long-term (chronic) conditions. Where to find more information The National Pressure Injury Advisory Panel (NPIAP): npiap.com Contact a health care provider if: You feel or see any changes to your skin. This information is not intended to replace advice given to you by your health care provider. Make sure you discuss any questions you have with your health care provider. Document Revised: 03/02/2022 Document Reviewed: 03/02/2022 Elsevier  Patient Education  2024 [employer].

## 2023-04-16 NOTE — Chronic Care Management (AMB) (Signed)
Chronic Care Management   CCM RN Visit Note  04/16/2023 Name: Christopher Armstrong MRN: [id number] DOB: September 30, 1941  Subjective: Christopher Armstrong is a 82 y.o. year old male who is a primary care patient of Everrett Coombe, DO. The patient was referred to the Chronic Care Management team for assistance with care management needs subsequent to provider initiation of CCM services and plan of care.    Today's Visit:  Engaged with patient by telephone for follow up visit.        Goals Addressed             This Visit's Progress    CCM Expected Outcome:  Monitor, Self-Manage and Reduce Symptoms of Heart Failure       Current Barriers:  Knowledge Deficits related to the changes that can take place with heart failure and the sx and sx of heart failure Care Coordination needs related to utilization of resources and finding help in the  home in a patient with heart failure Chronic Disease Management support and education needs related to effective management of heart failure Lacks caregiver support.     Wt Readings from Last 3 Encounters:  11/26/22 179 lb 9.6 oz (81.5 kg)  09/19/22 179 lb 9.6 oz (81.5 kg)  03/06/22 179 lb 9.6 oz (81.5 kg)   Patients weight is stable. Was stable at the provider office was 170. Planned Interventions: Basic overview and discussion of pathophysiology of Heart Failure reviewed. Discussed changes in heart health and his blood pressures have been elevated in the evenings. Encouraged the patient to talk to the pharm D about taking one of his medications for blood pressures at night. Continues to work with the pharm D on a regular basis. The patient states his fluid level is doing good. He was started on a medication at the [place], hydralazine, and he stopped taking it as he did not like the way it made him feel. Education on monitoring for fluid overload.  Provided education on low sodium diet. Education on monitoring for sodium in diet and not using extra salt in foods. The  patient does cook for self some and the patient receives MOW. The patient is mindful of sodium in his diet. The patient states that he is eating and he is watching his sodium. The food is bland, especially from MOW and he only has 3 teeth that he can chew with so he is having to eat softer food. Is monitoring his dietary intake.  Reviewed Heart Failure Action Plan in depth. The patient states that in the past when his weight goes up he has taken an extra fluid pill as directed by the provider.  Review of monitoring for +2/3 in one day or +5 in one week and to call the provider for changes or new onset of heart failure sx and sx. Education and support given. The patient denies any new changes in his heart failure. Sees the cardiologist at the [facility] on a regular basis.  Assessed need for readable accurate scales in home. The patient has a scale and the patient states that he can weigh daily but he does not. Education on the benefits of daily weights in monitoring for exacerbation of heart failure. Since the last outreach the patient has been weighing consistently. His weight is stable and staying around 179 currently at the doctor office. Provided education about placing scale on hard, flat surface Advised patient to weigh each morning after emptying bladder Discussed importance of daily weight and advised patient  to weigh and record daily Reviewed role of diuretics in prevention of fluid overload and management of heart failure. The patient states that he does take his Lasix as prescribed and is at the 40 mg dose. He states he will need a refill on his Lasix soon. Review and the patient has 3 refills available on his Lasix. Instructed the patient to call the pharmacy for refills when needed.  Discussed the importance of keeping all appointments with provider. The patient states that he sees the heart specialist every 6 months. Saw the pcp on 02-07-2023 and got things in place for someone to come into the home 6  hours a week. Continues to have help in the home three days a week, he is thankful for this. Provided patient with education about the role of exercise in the management of heart failure. The patient is limited in his ability to do exercise due to shortness of breath.  Review of elevation of feet and legs when sitting to help with fluid, the patient has swelling in his feet and legs, the patient also uses compression socks. He says when he is sitting he is normally in his recliner and has his feet elevated. Education and support given. The patient is keeping his feet elevated when sitting down. The patient states he is monitoring for extra fluid. He states it is moving up his leg more now. Advised patient to discuss changes in sx and sx of heart failure with provider Screening for signs and symptoms of depression related to chronic disease state  Assessed social determinant of health barriers The patient is not very active. He called RNCM today because he has a pressure ulcer area above  his sacrum. He has ordered a cushion but it is hard plastic and does not work well. He has been putting a cream on it that was recommended by the pharmacist but he does not think that is working well. He ask for advice from the pcp. A message has been sent to the pcp and staff asking for recommendations for managing the pressure area. Did advise the patient that the pcp may want to see the patient in the office. Gave other recommendations on relieving pressure to the area and use of a foam dressing to the area. Review of monitoring for sx and sx of infection and when to alert the provider. Will send information in the mail to the patient on pressure areas. In basket message sent to the pcp and staff asking for recommendations.   Symptom Management: Take medications as prescribed   Attend all scheduled provider appointments Call provider office for new concerns or questions  call the Suicide and Crisis Lifeline: 988 call  the [place] National Suicide Prevention Lifeline: 719-328-3531 or TTY: 9308477340 TTY 2675290533) to talk to a trained counselor call 1-800-273-TALK (toll free, 24 hour hotline) if experiencing a Mental Health or Behavioral Health Crisis  call office if I gain more than 2 pounds in one day or 5 pounds in one week keep legs up while sitting use salt in moderation watch for swelling in feet, ankles and legs every day weigh myself daily develop a rescue plan follow rescue plan if symptoms flare-up track symptoms and what helps feel better or worse dress right for the weather, hot or cold  Follow Up Plan: Telephone follow up appointment with care management team member scheduled for: 05-17-2023 at 0900 am       CCM:  Maintain, Monitor and Self-Manage Symptoms of COPD  Current Barriers:  Knowledge Deficits related to smoking with the use of oxygen, safety concerns Care Coordination needs related to physical needs and help in the home and how to utilize resources in a patient with COPD Chronic Disease Management support and education needs related to effective management of COPD Lacks caregiver support.  Medication changes and needs for pharm D support with cost effective medications to treat his COPD effectively  Planned Interventions: Provided patient with basic written and verbal COPD education on self care/management/and exacerbation prevention. The patient states he is using the [place], and is helping more than he thought. He is using the rescue inhaler a little more and actually had to use it before outreach today. Has had follow ups with his pulmonary provider, and cardiologist at the [facility].  Advised patient to track and manage COPD triggers. Review of triggers and how to avoid things that trigger exacerbation. Discussed triggers that could exacerbate his condition. Review of watching for acute changes in weather, smells that could exacerbate his condition, and monitoring others that  may have respiratory or contagious illness. The patient is mindful of triggers is staying in his home and does not leave unless he has to do so. The current weather and high temps outside are impacting his breathing in a negative way. Reflective listening and support given. Provided verbal instructions on pursed lip breathing and utilized returned demonstration as teach back. The patient uses oxygen 24/7 at 2.5 liters, has to pace activity.  Provided instruction about proper use of medications used for management of COPD including inhalers. The patient is compliant with medications. Uses oxygen at 2.5 liters via [place]. Discussed safety with the use of oxygen and smoking. The patient states he is safe and mindful of oxygen use when smoking. He works with the pharm D on a regular basis.   Advised patient to self assesses COPD action plan zone and make appointment with provider if in the yellow zone for 48 hours without improvement. Most recent appointment with the pcp  at [facility] was on 02-07-2023. Discussed getting involved with the pulmonary provider at the [facility] to help with medication and disease management. He is receiving help in his home three days a week for 6 hours and he is very thankful for this. Reflective listening and support given.  Advised patient to engage in light exercise as tolerated 3-5 days a week to aid in the the management of COPD. Patient limited to the amount of activity he does due to getting short of breath. The patient states that he walks in his apartment and when he goes out he has a power chair he can use if needed. Education on doing leg exercises when sitting to make sure not to loose muscle mass and strength.  Provided education about and advised patient to utilize infection prevention strategies to reduce risk of respiratory infection. Review of high risk of infection with his condition. Discussed the importance of adequate rest and management of fatigue with COPD. The patient tries to  get most of his activity done in the am because by afternoon he is really tired. He has help in the home three days a week and is thankful for this. Education and support given. Screening for signs and symptoms of depression related to chronic disease state  Assessed social determinant of health barriers The patient saw the pcp at the [place] yesterday, was also able to get in to see the eye doctor and get new glasses ordered. This was good for the  patient as he needed new glasses. The patient states that his new glasses have been ordered. The SW came in and got him set up for 6 hours of in home care a week and the nurse has called and is coming this afternoon for evaluation of his apartment. The patient is thankful for this as he has been concerned about safety in the shower. Will continue to monitor.  Denies any issues with his breathing. Is having concerns with a pressure area to his buttocks about his sacrum. In basket message sent to the pcp for recommendations. Will follow up accordingly. The patient sleeps in a recliner and is in a recliner most of the day due to his limited ability to move without getting extremely short of breath.   Symptom Management: Take medications as prescribed   Attend all scheduled provider appointments Call provider office for new concerns or questions  call the Suicide and Crisis Lifeline: 988 call the [place] National Suicide Prevention Lifeline: (661)647-8731 or TTY: (778)695-4408 TTY (763)789-0306) to talk to a trained counselor call 1-800-273-TALK (toll free, 24 hour hotline) if experiencing a Mental Health or Behavioral Health Crisis  identify and avoid work-related triggers identify and remove indoor air pollutants limit outdoor activity during cold weather listen for public air quality announcements every day do breathing exercises every day develop a rescue plan eliminate symptom triggers at home follow rescue plan if symptoms flare-up  Follow Up Plan:  Telephone follow up appointment with care management team member scheduled for: 05-17-2023 at 0900 am          Plan:Telephone follow up appointment with care management team member scheduled for:  05-17-2023 at 0900 am  Alto Denver RN, MSN, CCM RN Care Manager  Chronic Care Management Direct Number: [phone]

## 2023-04-22 ENCOUNTER — Telehealth: Payer: Self-pay

## 2023-04-22 NOTE — Progress Notes (Signed)
   04/22/2023  Patient ID: Christopher Armstrong, male   DOB: Dec 29, 1940, 82 y.o.   MRN: [id number]  Clinic routed request from Dr. Morey Hummingbird from [name]'s PCP office.  Patient would benefit from liquid nutrition supplementation with Ensure, but this product is not affordable for him.  I have consulted members of my team, as well as [facility] dieticians.  Some patients covered by Medicaid are able to get these supplements covered by insurance; but there is also an assistance plan through [employer] to help get insurance approval of Ensure for patient's with Medicare.  Sending Dr. Alverda Skeans the necessary form to be completed and faxed to the program.  Lenna Gilford, PharmD, DPLA

## 2023-04-23 ENCOUNTER — Other Ambulatory Visit: Payer: Self-pay | Admitting: Family Medicine

## 2023-04-24 ENCOUNTER — Encounter: Payer: Self-pay | Admitting: Family Medicine

## 2023-04-24 ENCOUNTER — Ambulatory Visit (INDEPENDENT_AMBULATORY_CARE_PROVIDER_SITE_OTHER): Payer: Medicare (Managed Care) | Admitting: Family Medicine

## 2023-04-24 VITALS — BP 148/51 | HR 75 | Resp 18 | Wt 162.1 lb

## 2023-04-24 DIAGNOSIS — Z741 Need for assistance with personal care: Secondary | ICD-10-CM | POA: Insufficient documentation

## 2023-04-24 DIAGNOSIS — L89311 Pressure ulcer of right buttock, stage 1: Secondary | ICD-10-CM

## 2023-04-24 DIAGNOSIS — H524 Presbyopia: Secondary | ICD-10-CM | POA: Insufficient documentation

## 2023-04-24 DIAGNOSIS — J449 Chronic obstructive pulmonary disease, unspecified: Secondary | ICD-10-CM | POA: Insufficient documentation

## 2023-04-24 DIAGNOSIS — I2589 Other forms of chronic ischemic heart disease: Secondary | ICD-10-CM | POA: Insufficient documentation

## 2023-04-24 MED ORDER — MUPIROCIN 2 % EX OINT: TOPICAL_OINTMENT | CUTANEOUS | 3 refills | Status: DC

## 2023-04-24 MED ORDER — ALBUTEROL SULFATE HFA 108 (90 BASE) MCG/ACT IN AERS: 2.0000 | INHALATION_SPRAY | Freq: Four times a day (QID) | RESPIRATORY_TRACT | 0 refills | Status: DC | PRN

## 2023-04-24 MED ORDER — LISINOPRIL 40 MG PO TABS: 40.0000 mg | ORAL_TABLET | Freq: Every day | ORAL | 2 refills | Status: DC

## 2023-04-24 NOTE — Progress Notes (Signed)
Established patient visit   Patient: Christopher Armstrong   DOB: 1940/11/10   82 y.o. Male  MRN: [id number] Visit Date: 04/24/2023  Today's healthcare provider: Charlton Amor, DO   Chief Complaint  Patient presents with   Pressure Ulcer    Left cheek right above tailbone x2-3 wks    SUBJECTIVE    Chief Complaint  Patient presents with   Pressure Ulcer    Left cheek right above tailbone x2-3 wks   HPI Pt presents for concerns of pressure ulcer for the past 2-3 weeks. He has been using A&D cream and a cushion for support.   Review of Systems  Constitutional:  Negative for activity change, fatigue and fever.  Respiratory:  Negative for cough and shortness of breath.   Cardiovascular:  Negative for chest pain.  Gastrointestinal:  Negative for abdominal pain.  Genitourinary:  Negative for difficulty urinating.       Current Meds  Medication Sig   albuterol (VENTOLIN HFA) 108 (90 Base) MCG/ACT inhaler Inhale 1-2 puffs into the lungs every 6 (six) hours as needed for wheezing or shortness of breath.   albuterol (VENTOLIN HFA) 108 (90 Base) MCG/ACT inhaler Inhale 2 puffs into the lungs every 6 (six) hours as needed for wheezing or shortness of breath.   AMBULATORY NON FORMULARY MEDICATION Motorized wheelchair per patient preference and insurance coverage. Patient suffers from copd and atrial fibrillation and debility d/t recent hospitalization which impairs their ability to perform daily activities like bathing, dressing, grooming, and toileting in the home.  A cane, crutch,walker or manual wheelchair will not resolve issue with performing activities of daily living. A  power wheelchair will allow patient to safely perform daily activities. Patient unable to safely propel the wheelchair and does not have a caregiver who can provide assistance. Length of need Lifetime.  Fax [phone]   amLODipine (NORVASC) 5 MG tablet Take 1 tablet by mouth daily.   apixaban (ELIQUIS) 5 MG TABS  tablet Take 1 tablet (5 mg total) by mouth 2 (two) times daily. (Patient taking differently: Take 2.5 mg by mouth 2 (two) times daily.)   aspirin EC 81 MG tablet Take 1 tablet (81 mg total) by mouth daily.   Budeson-Glycopyrrol-Formoterol (BREZTRI AEROSPHERE) 160-9-4.8 MCG/ACT AERO Inhale 2 puffs into the lungs 2 (two) times daily.   budesonide-formoterol (SYMBICORT) 160-4.5 MCG/ACT inhaler Inhale into the lungs.   furosemide (LASIX) 40 MG tablet TAKE 1/2 TO 1 (ONE-HALF TO ONE) TABLET BY MOUTH ONCE DAILY   hydrochlorothiazide (HYDRODIURIL) 12.5 MG tablet Take 12.5 mg by mouth daily.   ipratropium-albuterol (DUONEB) 0.5-2.5 (3) MG/3ML SOLN Take 3 mLs by nebulization every 4 (four) hours as needed.   magnesium hydroxide (MILK OF MAGNESIA) 400 MG/5ML suspension Take 30 mLs by mouth as needed.   mupirocin ointment (BACTROBAN) 2 % Apply to affected area TID for 7 days.   nitroGLYCERIN (NITROSTAT) 0.4 MG SL tablet Place 1 tablet (0.4 mg total) under the tongue every 5 (five) minutes as needed for chest pain.   OXYGEN Inhale into the lungs daily. 2L   Respiratory Therapy Supplies MISC Nebulizer tubing   [DISCONTINUED] lisinopril (ZESTRIL) 40 MG tablet Take 1 tablet (40 mg total) by mouth daily.    OBJECTIVE    BP (!) 148/51 (BP Location: Left Arm, Patient Position: Sitting, Cuff Size: Normal)   Pulse 75   Resp 18   Wt 162 lb 2 oz (73.5 kg)   SpO2 100% Comment: pt on 2L of  oxygen in office  BMI 23.94 kg/m   Physical Exam Vitals and nursing note reviewed.  Constitutional:      General: He is not in acute distress.    Appearance: Normal appearance.  HENT:     Head: Normocephalic and atraumatic.     Right Ear: External ear normal.     Left Ear: External ear normal.     Nose: Nose normal.  Eyes:     Conjunctiva/sclera: Conjunctivae normal.  Cardiovascular:     Rate and Rhythm: Normal rate and regular rhythm.  Pulmonary:     Effort: Pulmonary effort is normal.     Breath sounds: Normal  breath sounds.  Skin:    Comments: No skin breakdown noted no erythema  Neurological:     General: No focal deficit present.     Mental Status: He is alert and oriented to person, place, and time.  Psychiatric:        Mood and Affect: Mood normal.        Behavior: Behavior normal.        Thought Content: Thought content normal.        Judgment: Judgment normal.          ASSESSMENT & PLAN    Problem List Items Addressed This Visit       Other   Pressure injury of right buttock, stage 1 - Primary    Very pleasant 82 year old male presents with concern of pressure ulcer on buttock area.  He says he is unable to see it but has been feeling it and it feels sore.  He has been applying A&E ointment to the area.  On exam area it looks like there is no skin breakdown some erythema on the surface.  Gave him some mupirocin ointment to use until him to continue to use A&E ointment as well as a pressure bandage as his sister bought.       Return in about 3 weeks (around 05/15/2023).  Patient to make sure pressure injury has not gotten worse     Meds ordered this encounter  Medications   albuterol (VENTOLIN HFA) 108 (90 Base) MCG/ACT inhaler    Sig: Inhale 2 puffs into the lungs every 6 (six) hours as needed for wheezing or shortness of breath.    Dispense:  8 g    Refill:  0   lisinopril (ZESTRIL) 40 MG tablet    Sig: Take 1 tablet (40 mg total) by mouth daily.    Dispense:  90 tablet    Refill:  2   mupirocin ointment (BACTROBAN) 2 %    Sig: Apply to affected area TID for 7 days.    Dispense:  30 g    Refill:  3    No orders of the defined types were placed in this encounter.    Charlton Amor, DO  [facility] at [facility] [phone] (phone) [phone] (fax)  [facility]

## 2023-04-24 NOTE — Assessment & Plan Note (Signed)
Very pleasant 82 year old male presents with concern of pressure ulcer on buttock area.  He says he is unable to see it but has been feeling it and it feels sore.  He has been applying A&E ointment to the area.  On exam area it looks like there is no skin breakdown some erythema on the surface.  Gave him some mupirocin ointment to use until him to continue to use A&E ointment as well as a pressure bandage as his sister bought.

## 2023-05-15 ENCOUNTER — Ambulatory Visit: Payer: Medicare (Managed Care) | Admitting: Family Medicine

## 2023-05-15 DIAGNOSIS — J4489 Other specified chronic obstructive pulmonary disease: Secondary | ICD-10-CM

## 2023-05-15 DIAGNOSIS — I5032 Chronic diastolic (congestive) heart failure: Secondary | ICD-10-CM

## 2023-05-17 ENCOUNTER — Other Ambulatory Visit: Payer: Medicare (Managed Care)

## 2023-05-17 ENCOUNTER — Other Ambulatory Visit: Payer: Self-pay

## 2023-05-17 ENCOUNTER — Telehealth: Payer: Medicare (Managed Care)

## 2023-05-17 DIAGNOSIS — I2111 ST elevation (STEMI) myocardial infarction involving right coronary artery: Secondary | ICD-10-CM | POA: Diagnosis not present

## 2023-05-17 DIAGNOSIS — R1310 Dysphagia, unspecified: Secondary | ICD-10-CM | POA: Diagnosis not present

## 2023-05-17 DIAGNOSIS — R600 Localized edema: Secondary | ICD-10-CM | POA: Diagnosis not present

## 2023-05-17 DIAGNOSIS — K222 Esophageal obstruction: Secondary | ICD-10-CM | POA: Diagnosis not present

## 2023-05-17 DIAGNOSIS — I5032 Chronic diastolic (congestive) heart failure: Secondary | ICD-10-CM | POA: Diagnosis not present

## 2023-05-17 DIAGNOSIS — I9581 Postprocedural hypotension: Secondary | ICD-10-CM | POA: Diagnosis not present

## 2023-05-17 DIAGNOSIS — J9601 Acute respiratory failure with hypoxia: Secondary | ICD-10-CM | POA: Diagnosis not present

## 2023-05-17 DIAGNOSIS — I252 Old myocardial infarction: Secondary | ICD-10-CM | POA: Diagnosis not present

## 2023-05-17 DIAGNOSIS — I959 Hypotension, unspecified: Secondary | ICD-10-CM | POA: Diagnosis not present

## 2023-05-17 DIAGNOSIS — I739 Peripheral vascular disease, unspecified: Secondary | ICD-10-CM | POA: Diagnosis not present

## 2023-05-17 DIAGNOSIS — R933 Abnormal findings on diagnostic imaging of other parts of digestive tract: Secondary | ICD-10-CM | POA: Diagnosis not present

## 2023-05-17 DIAGNOSIS — R0989 Other specified symptoms and signs involving the circulatory and respiratory systems: Secondary | ICD-10-CM | POA: Diagnosis not present

## 2023-05-17 DIAGNOSIS — D696 Thrombocytopenia, unspecified: Secondary | ICD-10-CM | POA: Diagnosis not present

## 2023-05-17 DIAGNOSIS — K921 Melena: Secondary | ICD-10-CM | POA: Diagnosis not present

## 2023-05-17 DIAGNOSIS — K5790 Diverticulosis of intestine, part unspecified, without perforation or abscess without bleeding: Secondary | ICD-10-CM | POA: Diagnosis not present

## 2023-05-17 DIAGNOSIS — I255 Ischemic cardiomyopathy: Secondary | ICD-10-CM | POA: Diagnosis not present

## 2023-05-17 DIAGNOSIS — R140 Abdominal distension (gaseous): Secondary | ICD-10-CM | POA: Diagnosis not present

## 2023-05-17 DIAGNOSIS — N179 Acute kidney failure, unspecified: Secondary | ICD-10-CM | POA: Diagnosis not present

## 2023-05-17 DIAGNOSIS — R0602 Shortness of breath: Secondary | ICD-10-CM | POA: Diagnosis not present

## 2023-05-17 DIAGNOSIS — R5381 Other malaise: Secondary | ICD-10-CM | POA: Diagnosis not present

## 2023-05-17 DIAGNOSIS — D500 Iron deficiency anemia secondary to blood loss (chronic): Secondary | ICD-10-CM | POA: Diagnosis not present

## 2023-05-17 DIAGNOSIS — R4182 Altered mental status, unspecified: Secondary | ICD-10-CM | POA: Diagnosis not present

## 2023-05-17 DIAGNOSIS — J449 Chronic obstructive pulmonary disease, unspecified: Secondary | ICD-10-CM | POA: Diagnosis not present

## 2023-05-17 DIAGNOSIS — J9611 Chronic respiratory failure with hypoxia: Secondary | ICD-10-CM | POA: Diagnosis not present

## 2023-05-17 DIAGNOSIS — R918 Other nonspecific abnormal finding of lung field: Secondary | ICD-10-CM | POA: Diagnosis not present

## 2023-05-17 DIAGNOSIS — R9082 White matter disease, unspecified: Secondary | ICD-10-CM | POA: Diagnosis not present

## 2023-05-17 DIAGNOSIS — R1314 Dysphagia, pharyngoesophageal phase: Secondary | ICD-10-CM | POA: Diagnosis not present

## 2023-05-17 DIAGNOSIS — E785 Hyperlipidemia, unspecified: Secondary | ICD-10-CM | POA: Diagnosis not present

## 2023-05-17 DIAGNOSIS — A419 Sepsis, unspecified organism: Secondary | ICD-10-CM | POA: Diagnosis not present

## 2023-05-17 DIAGNOSIS — K5900 Constipation, unspecified: Secondary | ICD-10-CM | POA: Diagnosis not present

## 2023-05-17 DIAGNOSIS — K625 Hemorrhage of anus and rectum: Secondary | ICD-10-CM | POA: Diagnosis not present

## 2023-05-17 DIAGNOSIS — F419 Anxiety disorder, unspecified: Secondary | ICD-10-CM | POA: Diagnosis not present

## 2023-05-17 DIAGNOSIS — Z955 Presence of coronary angioplasty implant and graft: Secondary | ICD-10-CM | POA: Diagnosis not present

## 2023-05-17 DIAGNOSIS — R1032 Left lower quadrant pain: Secondary | ICD-10-CM | POA: Diagnosis not present

## 2023-05-17 DIAGNOSIS — I708 Atherosclerosis of other arteries: Secondary | ICD-10-CM | POA: Diagnosis not present

## 2023-05-17 DIAGNOSIS — E782 Mixed hyperlipidemia: Secondary | ICD-10-CM | POA: Diagnosis not present

## 2023-05-17 DIAGNOSIS — J849 Interstitial pulmonary disease, unspecified: Secondary | ICD-10-CM | POA: Diagnosis not present

## 2023-05-17 DIAGNOSIS — J439 Emphysema, unspecified: Secondary | ICD-10-CM | POA: Diagnosis not present

## 2023-05-17 DIAGNOSIS — I10 Essential (primary) hypertension: Secondary | ICD-10-CM | POA: Diagnosis not present

## 2023-05-17 DIAGNOSIS — K264 Chronic or unspecified duodenal ulcer with hemorrhage: Secondary | ICD-10-CM | POA: Diagnosis not present

## 2023-05-17 DIAGNOSIS — K259 Gastric ulcer, unspecified as acute or chronic, without hemorrhage or perforation: Secondary | ICD-10-CM | POA: Diagnosis not present

## 2023-05-17 DIAGNOSIS — D7289 Other specified disorders of white blood cells: Secondary | ICD-10-CM | POA: Diagnosis not present

## 2023-05-17 DIAGNOSIS — Z9981 Dependence on supplemental oxygen: Secondary | ICD-10-CM | POA: Diagnosis not present

## 2023-05-17 DIAGNOSIS — K269 Duodenal ulcer, unspecified as acute or chronic, without hemorrhage or perforation: Secondary | ICD-10-CM | POA: Diagnosis not present

## 2023-05-17 DIAGNOSIS — R0789 Other chest pain: Secondary | ICD-10-CM | POA: Diagnosis not present

## 2023-05-17 DIAGNOSIS — K254 Chronic or unspecified gastric ulcer with hemorrhage: Secondary | ICD-10-CM | POA: Diagnosis not present

## 2023-05-17 DIAGNOSIS — K6389 Other specified diseases of intestine: Secondary | ICD-10-CM | POA: Diagnosis not present

## 2023-05-17 DIAGNOSIS — K922 Gastrointestinal hemorrhage, unspecified: Secondary | ICD-10-CM | POA: Diagnosis not present

## 2023-05-17 DIAGNOSIS — G4733 Obstructive sleep apnea (adult) (pediatric): Secondary | ICD-10-CM | POA: Diagnosis not present

## 2023-05-17 DIAGNOSIS — Z7902 Long term (current) use of antithrombotics/antiplatelets: Secondary | ICD-10-CM | POA: Diagnosis not present

## 2023-05-17 DIAGNOSIS — G319 Degenerative disease of nervous system, unspecified: Secondary | ICD-10-CM | POA: Diagnosis not present

## 2023-05-17 DIAGNOSIS — K2980 Duodenitis without bleeding: Secondary | ICD-10-CM | POA: Diagnosis not present

## 2023-05-17 DIAGNOSIS — Z515 Encounter for palliative care: Secondary | ICD-10-CM | POA: Diagnosis not present

## 2023-05-17 DIAGNOSIS — I213 ST elevation (STEMI) myocardial infarction of unspecified site: Secondary | ICD-10-CM | POA: Diagnosis not present

## 2023-05-17 DIAGNOSIS — J9602 Acute respiratory failure with hypercapnia: Secondary | ICD-10-CM | POA: Diagnosis not present

## 2023-05-17 DIAGNOSIS — K219 Gastro-esophageal reflux disease without esophagitis: Secondary | ICD-10-CM | POA: Diagnosis not present

## 2023-05-17 DIAGNOSIS — K257 Chronic gastric ulcer without hemorrhage or perforation: Secondary | ICD-10-CM | POA: Diagnosis not present

## 2023-05-17 DIAGNOSIS — D6832 Hemorrhagic disorder due to extrinsic circulating anticoagulants: Secondary | ICD-10-CM | POA: Diagnosis not present

## 2023-05-17 DIAGNOSIS — R197 Diarrhea, unspecified: Secondary | ICD-10-CM | POA: Diagnosis not present

## 2023-05-17 DIAGNOSIS — Z7901 Long term (current) use of anticoagulants: Secondary | ICD-10-CM | POA: Diagnosis not present

## 2023-05-17 DIAGNOSIS — T82855A Stenosis of coronary artery stent, initial encounter: Secondary | ICD-10-CM | POA: Diagnosis not present

## 2023-05-17 DIAGNOSIS — K5731 Diverticulosis of large intestine without perforation or abscess with bleeding: Secondary | ICD-10-CM | POA: Diagnosis not present

## 2023-05-17 DIAGNOSIS — I130 Hypertensive heart and chronic kidney disease with heart failure and stage 1 through stage 4 chronic kidney disease, or unspecified chronic kidney disease: Secondary | ICD-10-CM | POA: Diagnosis not present

## 2023-05-17 DIAGNOSIS — J90 Pleural effusion, not elsewhere classified: Secondary | ICD-10-CM | POA: Diagnosis not present

## 2023-05-17 DIAGNOSIS — I480 Paroxysmal atrial fibrillation: Secondary | ICD-10-CM | POA: Diagnosis not present

## 2023-05-17 DIAGNOSIS — I059 Rheumatic mitral valve disease, unspecified: Secondary | ICD-10-CM | POA: Diagnosis not present

## 2023-05-17 DIAGNOSIS — R0600 Dyspnea, unspecified: Secondary | ICD-10-CM | POA: Diagnosis not present

## 2023-05-17 DIAGNOSIS — J4489 Other specified chronic obstructive pulmonary disease: Secondary | ICD-10-CM | POA: Diagnosis not present

## 2023-05-17 DIAGNOSIS — I499 Cardiac arrhythmia, unspecified: Secondary | ICD-10-CM | POA: Diagnosis not present

## 2023-05-17 DIAGNOSIS — G8929 Other chronic pain: Secondary | ICD-10-CM | POA: Diagnosis not present

## 2023-05-17 DIAGNOSIS — D62 Acute posthemorrhagic anemia: Secondary | ICD-10-CM | POA: Diagnosis not present

## 2023-05-17 DIAGNOSIS — I2510 Atherosclerotic heart disease of native coronary artery without angina pectoris: Secondary | ICD-10-CM | POA: Diagnosis not present

## 2023-05-17 DIAGNOSIS — R579 Shock, unspecified: Secondary | ICD-10-CM | POA: Diagnosis not present

## 2023-05-17 DIAGNOSIS — R110 Nausea: Secondary | ICD-10-CM | POA: Diagnosis not present

## 2023-05-17 DIAGNOSIS — Z66 Do not resuscitate: Secondary | ICD-10-CM | POA: Diagnosis not present

## 2023-05-17 DIAGNOSIS — I479 Paroxysmal tachycardia, unspecified: Secondary | ICD-10-CM | POA: Diagnosis not present

## 2023-05-17 DIAGNOSIS — I2119 ST elevation (STEMI) myocardial infarction involving other coronary artery of inferior wall: Secondary | ICD-10-CM | POA: Diagnosis not present

## 2023-05-17 DIAGNOSIS — K769 Liver disease, unspecified: Secondary | ICD-10-CM | POA: Diagnosis not present

## 2023-05-17 DIAGNOSIS — I517 Cardiomegaly: Secondary | ICD-10-CM | POA: Diagnosis not present

## 2023-05-17 DIAGNOSIS — B3781 Candidal esophagitis: Secondary | ICD-10-CM | POA: Diagnosis not present

## 2023-05-17 DIAGNOSIS — R079 Chest pain, unspecified: Secondary | ICD-10-CM | POA: Diagnosis not present

## 2023-05-17 DIAGNOSIS — I4891 Unspecified atrial fibrillation: Secondary | ICD-10-CM | POA: Diagnosis not present

## 2023-05-17 DIAGNOSIS — Z7189 Other specified counseling: Secondary | ICD-10-CM | POA: Diagnosis not present

## 2023-05-17 DIAGNOSIS — I358 Other nonrheumatic aortic valve disorders: Secondary | ICD-10-CM | POA: Diagnosis not present

## 2023-05-17 DIAGNOSIS — N400 Enlarged prostate without lower urinary tract symptoms: Secondary | ICD-10-CM | POA: Diagnosis not present

## 2023-05-17 DIAGNOSIS — D649 Anemia, unspecified: Secondary | ICD-10-CM | POA: Diagnosis not present

## 2023-05-17 DIAGNOSIS — D72828 Other elevated white blood cell count: Secondary | ICD-10-CM | POA: Diagnosis not present

## 2023-05-17 DIAGNOSIS — F17210 Nicotine dependence, cigarettes, uncomplicated: Secondary | ICD-10-CM | POA: Diagnosis not present

## 2023-05-17 DIAGNOSIS — R0603 Acute respiratory distress: Secondary | ICD-10-CM | POA: Diagnosis not present

## 2023-05-17 DIAGNOSIS — I5031 Acute diastolic (congestive) heart failure: Secondary | ICD-10-CM | POA: Diagnosis not present

## 2023-05-17 DIAGNOSIS — F17200 Nicotine dependence, unspecified, uncomplicated: Secondary | ICD-10-CM | POA: Diagnosis not present

## 2023-05-17 DIAGNOSIS — R1031 Right lower quadrant pain: Secondary | ICD-10-CM | POA: Diagnosis not present

## 2023-05-17 DIAGNOSIS — R059 Cough, unspecified: Secondary | ICD-10-CM | POA: Diagnosis not present

## 2023-05-17 NOTE — Patient Outreach (Signed)
Care Management   Visit Note  05/17/2023 Name: ARIA PICKRELL MRN: [id number] DOB: May 15, 1941  Subjective: ARCADIO COPE is a 82 y.o. year old male who is a primary care patient of Everrett Coombe, DO. The Care Management team was consulted for assistance.      Engaged with patient Engaged with patient by telephone.   Assessment:   Goals Addressed             This Visit's Progress    RNCM Care Management Expected Outcome:  Monitor, Self-Manage and Reduce Symptoms of Heart Failure       Current Barriers:  Knowledge Deficits related to the changes that can take place with heart failure and the sx and sx of heart failure Care Coordination needs related to utilization of resources and finding help in the  home in a patient with heart failure Chronic Disease Management support and education needs related to effective management of heart failure Lacks caregiver support.     Wt Readings from Last 3 Encounters:  04/24/23 162 lb 2 oz (73.5 kg)  11/26/22 179 lb 9.6 oz (81.5 kg)  09/19/22 179 lb 9.6 oz (81.5 kg)   Patients weight is stable.  Planned Interventions: Basic overview and discussion of pathophysiology of Heart Failure reviewed. Discussed changes in heart health and his blood pressures have been elevated in the evenings. Encouraged the patient to talk to the pharm D about taking one of his medications for blood pressures at night. Continues to work with the pharm D on a regular basis. The patient states his fluid level is doing good. He was started on a medication at the [place], hydralazine, and he stopped taking it as he did not like the way it made him feel. Education on monitoring for fluid overload. Denies any acute shifts in his water weight. Education provided on monitoring for excess fluid.  Provided education on low sodium diet. Education on monitoring for sodium in diet and not using extra salt in foods. The patient does cook for self some and the patient receives MOW. The  patient is mindful of sodium in his diet. The patient states that he is eating and he is watching his sodium. The food is bland, especially from MOW and he only has 3 teeth that he can chew with so he is having to eat softer food. Is monitoring his dietary intake.  Reviewed Heart Failure Action Plan in depth. The patient states that in the past when his weight goes up he has taken an extra fluid pill as directed by the provider.  Review of monitoring for +2/3 in one day or +5 in one week and to call the provider for changes or new onset of heart failure sx and sx. Education and support given. The patient denies any new changes in his heart failure. Sees the cardiologist at the [facility] on a regular basis.  Assessed need for readable accurate scales in home. The patient has a scale and the patient states that he can weigh daily but he does not. Education on the benefits of daily weights in monitoring for exacerbation of heart failure. Since the last outreach the patient has been weighing consistently. His weight is stable and staying around 162 currently at the doctor office. The provider office wants him to start drinking Ensure. Education provided. Will follow up with the CMA at the pcp office for follow up on [facility] and Ensure supply. Provided education about placing scale on hard, flat surface Advised patient to  weigh each morning after emptying bladder Discussed importance of daily weight and advised patient to weigh and record daily Reviewed role of diuretics in prevention of fluid overload and management of heart failure. The patient states that he does take his Lasix as prescribed and is at the 40 mg dose. He states he will need a refill on his Lasix soon. Review and the patient has 3 refills available on his Lasix. Instructed the patient to call the pharmacy for refills when needed.  Discussed the importance of keeping all appointments with provider. The patient states that he sees the heart  specialist every 6 months. The patient saw the pcp recently. Continues to have help in the home three days a week, he is thankful for this. Provided patient with education about the role of exercise in the management of heart failure. The patient is limited in his ability to do exercise due to shortness of breath.  Review of elevation of feet and legs when sitting to help with fluid, the patient has swelling in his feet and legs, the patient also uses compression socks. He says when he is sitting he is normally in his recliner and has his feet elevated. Education and support given. The patient is keeping his feet elevated when sitting down. The patient states he is monitoring for extra fluid. He states it is moving up his leg more now. Advised patient to discuss changes in sx and sx of heart failure with provider Screening for signs and symptoms of depression related to chronic disease state  Assessed social determinant of health barriers The patient is not very active. He called RNCM today because he has a pressure ulcer area above  his sacrum. He has ordered a cushion but it is hard plastic and does not work well. He has been putting a cream on it that was recommended by the pharmacist but he does not think that is working well. The patient was seen in the office by the pcp and area evaluated. The area did not have any broken areas. Mupirocin cream given and the cream that the patient used was alternated. The patient got a cushion to help with pressure to the area. He says the soreness has gone. Encouraged the patient to move frequently in his chair. The patient states that the cushion is helping a lot. He said they filled out a form for him to get Ensure from [facility] and his sister took the form by the office. The patient has not heard from them yet. Will contact the clinical staff for updates.  Symptom Management: Take medications as prescribed   Attend all scheduled provider appointments Call  provider office for new concerns or questions  call the Suicide and Crisis Lifeline: 988 call the [place] National Suicide Prevention Lifeline: 716 631 7223 or TTY: 478-145-5498 TTY 615-221-1061) to talk to a trained counselor call 1-800-273-TALK (toll free, 24 hour hotline) if experiencing a Mental Health or Behavioral Health Crisis  call office if I gain more than 2 pounds in one day or 5 pounds in one week keep legs up while sitting use salt in moderation watch for swelling in feet, ankles and legs every day weigh myself daily develop a rescue plan follow rescue plan if symptoms flare-up track symptoms and what helps feel better or worse dress right for the weather, hot or cold  Follow Up Plan: Telephone follow up appointment with care management team member scheduled for: 07-05-2023 at 0900 am       [facility] Care  Management:  Maintain, Monitor and Self-Manage Symptoms of COPD       Current Barriers:  Knowledge Deficits related to smoking with the use of oxygen, safety concerns Care Coordination needs related to physical needs and help in the home and how to utilize resources in a patient with COPD Chronic Disease Management support and education needs related to effective management of COPD Lacks caregiver support.  Medication changes and needs for pharm D support with cost effective medications to treat his COPD effectively  Planned Interventions: Provided patient with basic written and verbal COPD education on self care/management/and exacerbation prevention. The patient states he is using the [place], and is helping more than he thought. He is using the rescue inhaler a little more. The patient states that he is staying in and doesn't go out unless he has to do so. Has had follow ups with his pulmonary provider, and cardiologist at the [facility].  Advised patient to track and manage COPD triggers. Review of triggers and how to avoid things that trigger exacerbation. Discussed triggers that  could exacerbate his condition. Review of watching for acute changes in weather, smells that could exacerbate his condition, and monitoring others that may have respiratory or contagious illness. The patient is mindful of triggers is staying in his home and does not leave unless he has to do so. The current weather and high temps outside are impacting his breathing in a negative way. Reflective listening and support given. Provided verbal instructions on pursed lip breathing and utilized returned demonstration as teach back. The patient uses oxygen 24/7 at 2.5 liters, has to pace activity. Was having issues with a water leak and will work on that today. The bottle was not getting a good seal on his concentrator.  Provided instruction about proper use of medications used for management of COPD including inhalers. The patient is compliant with medications. Uses oxygen at 2.5 liters via [place]. Discussed safety with the use of oxygen and smoking. The patient states he is safe and mindful of oxygen use when smoking. He works with the pharm D on a regular basis.   Advised patient to self assesses COPD action plan zone and make appointment with provider if in the yellow zone for 48 hours without improvement. Discussed getting involved with the pulmonary provider at the [facility] to help with medication and disease management. He is receiving help in his home three days a week for 6 hours and he is very thankful for this. Reflective listening and support given.  Advised patient to engage in light exercise as tolerated 3-5 days a week to aid in the the management of COPD. Patient limited to the amount of activity he does due to getting short of breath. The patient states that he walks in his apartment and when he goes out he has a power chair he can use if needed. Education on doing leg exercises when sitting to make sure not to loose muscle mass and strength.  Provided education about and advised patient to utilize infection  prevention strategies to reduce risk of respiratory infection. Review of high risk of infection with his condition. Discussed the importance of adequate rest and management of fatigue with COPD. The patient tries to get most of his activity done in the am because by afternoon he is really tired. He has help in the home three days a week and is thankful for this. Education and support given. Screening for signs and symptoms of depression related to chronic disease state  Assessed social determinant of health barriers The patient saw the pcp at the [place] yesterday, was also able to get in to see the eye doctor and get new glasses ordered. This was good for the patient as he needed new glasses. The patient states that his new glasses have been ordered. He states today he has his new glasses and one of the lens is not correct so he is going to have to take them back to the [place].  The SW came in and got him set up for 6 hours of in home care a week and the nurse has called and is coming this afternoon for evaluation of his apartment. The patient is thankful for this as he has been concerned about safety in the shower. Will continue to monitor.  Denies any issues with his breathing.  The patient sleeps in a recliner and is in a recliner most of the day due to his limited ability to move without getting extremely short of breath.   Symptom Management: Take medications as prescribed   Attend all scheduled provider appointments Call provider office for new concerns or questions  call the Suicide and Crisis Lifeline: 988 call the [place] National Suicide Prevention Lifeline: 435-737-5485 or TTY: (620) 600-9439 TTY 915 361 5211) to talk to a trained counselor call 1-800-273-TALK (toll free, 24 hour hotline) if experiencing a Mental Health or Behavioral Health Crisis  identify and avoid work-related triggers identify and remove indoor air pollutants limit outdoor activity during cold weather listen for public air  quality announcements every day do breathing exercises every day develop a rescue plan eliminate symptom triggers at home follow rescue plan if symptoms flare-up  Follow Up Plan: Telephone follow up appointment with care management team member scheduled for: 07-05-2023 at 0900 am            Consent to Services:  Patient was given information about care management services, agreed to services, and gave verbal consent to participate.   Plan: Telephone follow up appointment with care management team member scheduled for: 07-05-2023 at 0900 am  Alto Denver RN, MSN, CCM RN Care Manager  [facility]  Ambulatory Care Management  Direct Number: [phone]

## 2023-05-17 NOTE — Patient Instructions (Signed)
Visit Information  Thank you for taking time to visit with me today. Please don't hesitate to contact me if I can be of assistance to you before our next scheduled telephone appointment.  Following are the goals we discussed today:   Goals Addressed             This Visit's Progress    RNCM Care Management Expected Outcome:  Monitor, Self-Manage and Reduce Symptoms of Heart Failure       Current Barriers:  Knowledge Deficits related to the changes that can take place with heart failure and the sx and sx of heart failure Care Coordination needs related to utilization of resources and finding help in the  home in a patient with heart failure Chronic Disease Management support and education needs related to effective management of heart failure Lacks caregiver support.     Wt Readings from Last 3 Encounters:  04/24/23 162 lb 2 oz (73.5 kg)  11/26/22 179 lb 9.6 oz (81.5 kg)  09/19/22 179 lb 9.6 oz (81.5 kg)   Patients weight is stable.  Planned Interventions: Basic overview and discussion of pathophysiology of Heart Failure reviewed. Discussed changes in heart health and his blood pressures have been elevated in the evenings. Encouraged the patient to talk to the pharm D about taking one of his medications for blood pressures at night. Continues to work with the pharm D on a regular basis. The patient states his fluid level is doing good. He was started on a medication at the [place], hydralazine, and he stopped taking it as he did not like the way it made him feel. Education on monitoring for fluid overload. Denies any acute shifts in his water weight. Education provided on monitoring for excess fluid.  Provided education on low sodium diet. Education on monitoring for sodium in diet and not using extra salt in foods. The patient does cook for self some and the patient receives MOW. The patient is mindful of sodium in his diet. The patient states that he is eating and he is watching his sodium.  The food is bland, especially from MOW and he only has 3 teeth that he can chew with so he is having to eat softer food. Is monitoring his dietary intake.  Reviewed Heart Failure Action Plan in depth. The patient states that in the past when his weight goes up he has taken an extra fluid pill as directed by the provider.  Review of monitoring for +2/3 in one day or +5 in one week and to call the provider for changes or new onset of heart failure sx and sx. Education and support given. The patient denies any new changes in his heart failure. Sees the cardiologist at the [facility] on a regular basis.  Assessed need for readable accurate scales in home. The patient has a scale and the patient states that he can weigh daily but he does not. Education on the benefits of daily weights in monitoring for exacerbation of heart failure. Since the last outreach the patient has been weighing consistently. His weight is stable and staying around 162 currently at the doctor office. The provider office wants him to start drinking Ensure. Education provided. Will follow up with the CMA at the pcp office for follow up on [facility] and Ensure supply. Provided education about placing scale on hard, flat surface Advised patient to weigh each morning after emptying bladder Discussed importance of daily weight and advised patient to weigh and record daily Reviewed role  of diuretics in prevention of fluid overload and management of heart failure. The patient states that he does take his Lasix as prescribed and is at the 40 mg dose. He states he will need a refill on his Lasix soon. Review and the patient has 3 refills available on his Lasix. Instructed the patient to call the pharmacy for refills when needed.  Discussed the importance of keeping all appointments with provider. The patient states that he sees the heart specialist every 6 months. The patient saw the pcp recently. Continues to have help in the home three days a week, he  is thankful for this. Provided patient with education about the role of exercise in the management of heart failure. The patient is limited in his ability to do exercise due to shortness of breath.  Review of elevation of feet and legs when sitting to help with fluid, the patient has swelling in his feet and legs, the patient also uses compression socks. He says when he is sitting he is normally in his recliner and has his feet elevated. Education and support given. The patient is keeping his feet elevated when sitting down. The patient states he is monitoring for extra fluid. He states it is moving up his leg more now. Advised patient to discuss changes in sx and sx of heart failure with provider Screening for signs and symptoms of depression related to chronic disease state  Assessed social determinant of health barriers The patient is not very active. He called RNCM today because he has a pressure ulcer area above  his sacrum. He has ordered a cushion but it is hard plastic and does not work well. He has been putting a cream on it that was recommended by the pharmacist but he does not think that is working well. The patient was seen in the office by the pcp and area evaluated. The area did not have any broken areas. Mupirocin cream given and the cream that the patient used was alternated. The patient got a cushion to help with pressure to the area. He says the soreness has gone. Encouraged the patient to move frequently in his chair. The patient states that the cushion is helping a lot. He said they filled out a form for him to get Ensure from [facility] and his sister took the form by the office. The patient has not heard from them yet. Will contact the clinical staff for updates.  Symptom Management: Take medications as prescribed   Attend all scheduled provider appointments Call provider office for new concerns or questions  call the Suicide and Crisis Lifeline: 988 call the [place] National Suicide  Prevention Lifeline: 757-201-3867 or TTY: (973) 506-1562 TTY 7072182099) to talk to a trained counselor call 1-800-273-TALK (toll free, 24 hour hotline) if experiencing a Mental Health or Behavioral Health Crisis  call office if I gain more than 2 pounds in one day or 5 pounds in one week keep legs up while sitting use salt in moderation watch for swelling in feet, ankles and legs every day weigh myself daily develop a rescue plan follow rescue plan if symptoms flare-up track symptoms and what helps feel better or worse dress right for the weather, hot or cold  Follow Up Plan: Telephone follow up appointment with care management team member scheduled for: 07-05-2023 at 0900 am       RNCM Care Management:  Maintain, Monitor and Self-Manage Symptoms of COPD       Current Barriers:  Knowledge Deficits related  to smoking with the use of oxygen, safety concerns Care Coordination needs related to physical needs and help in the home and how to utilize resources in a patient with COPD Chronic Disease Management support and education needs related to effective management of COPD Lacks caregiver support.  Medication changes and needs for pharm D support with cost effective medications to treat his COPD effectively  Planned Interventions: Provided patient with basic written and verbal COPD education on self care/management/and exacerbation prevention. The patient states he is using the [place], and is helping more than he thought. He is using the rescue inhaler a little more. The patient states that he is staying in and doesn't go out unless he has to do so. Has had follow ups with his pulmonary provider, and cardiologist at the [facility].  Advised patient to track and manage COPD triggers. Review of triggers and how to avoid things that trigger exacerbation. Discussed triggers that could exacerbate his condition. Review of watching for acute changes in weather, smells that could exacerbate his condition,  and monitoring others that may have respiratory or contagious illness. The patient is mindful of triggers is staying in his home and does not leave unless he has to do so. The current weather and high temps outside are impacting his breathing in a negative way. Reflective listening and support given. Provided verbal instructions on pursed lip breathing and utilized returned demonstration as teach back. The patient uses oxygen 24/7 at 2.5 liters, has to pace activity. Was having issues with a water leak and will work on that today. The bottle was not getting a good seal on his concentrator.  Provided instruction about proper use of medications used for management of COPD including inhalers. The patient is compliant with medications. Uses oxygen at 2.5 liters via [place]. Discussed safety with the use of oxygen and smoking. The patient states he is safe and mindful of oxygen use when smoking. He works with the pharm D on a regular basis.   Advised patient to self assesses COPD action plan zone and make appointment with provider if in the yellow zone for 48 hours without improvement. Discussed getting involved with the pulmonary provider at the [facility] to help with medication and disease management. He is receiving help in his home three days a week for 6 hours and he is very thankful for this. Reflective listening and support given.  Advised patient to engage in light exercise as tolerated 3-5 days a week to aid in the the management of COPD. Patient limited to the amount of activity he does due to getting short of breath. The patient states that he walks in his apartment and when he goes out he has a power chair he can use if needed. Education on doing leg exercises when sitting to make sure not to loose muscle mass and strength.  Provided education about and advised patient to utilize infection prevention strategies to reduce risk of respiratory infection. Review of high risk of infection with his condition. Discussed  the importance of adequate rest and management of fatigue with COPD. The patient tries to get most of his activity done in the am because by afternoon he is really tired. He has help in the home three days a week and is thankful for this. Education and support given. Screening for signs and symptoms of depression related to chronic disease state  Assessed social determinant of health barriers The patient saw the pcp at the [place] yesterday, was also able to get in  to see the eye doctor and get new glasses ordered. This was good for the patient as he needed new glasses. The patient states that his new glasses have been ordered. He states today he has his new glasses and one of the lens is not correct so he is going to have to take them back to the [place].  The SW came in and got him set up for 6 hours of in home care a week and the nurse has called and is coming this afternoon for evaluation of his apartment. The patient is thankful for this as he has been concerned about safety in the shower. Will continue to monitor.  Denies any issues with his breathing.  The patient sleeps in a recliner and is in a recliner most of the day due to his limited ability to move without getting extremely short of breath.   Symptom Management: Take medications as prescribed   Attend all scheduled provider appointments Call provider office for new concerns or questions  call the Suicide and Crisis Lifeline: 988 call the [place] National Suicide Prevention Lifeline: 484-228-0289 or TTY: 954-710-7735 TTY (571)392-7673) to talk to a trained counselor call 1-800-273-TALK (toll free, 24 hour hotline) if experiencing a Mental Health or Behavioral Health Crisis  identify and avoid work-related triggers identify and remove indoor air pollutants limit outdoor activity during cold weather listen for public air quality announcements every day do breathing exercises every day develop a rescue plan eliminate symptom triggers at  home follow rescue plan if symptoms flare-up  Follow Up Plan: Telephone follow up appointment with care management team member scheduled for: 07-05-2023 at 0900 am           Our next appointment is by telephone on 07-05-2023  at 9 am  Please call the care guide team at [phone] if you need to cancel or reschedule your appointment.   If you are experiencing a Mental Health or Behavioral Health Crisis or need someone to talk to, please call the Suicide and Crisis Lifeline: 988 call the [place] National Suicide Prevention Lifeline: 918 367 6999 or TTY: 862-651-2711 TTY 872-878-5655) to talk to a trained counselor call 1-800-273-TALK (toll free, 24 hour hotline) go to [facility] [address] [place] [phone])   The patient verbalized understanding of instructions, educational materials, and care plan provided today and DECLINED offer to receive copy of patient instructions, educational materials, and care plan.     Alto Denver RN, MSN, CCM RN Care Manager  [facility]  Ambulatory Care Management  Direct Number: [phone]

## 2023-05-18 DIAGNOSIS — R933 Abnormal findings on diagnostic imaging of other parts of digestive tract: Secondary | ICD-10-CM | POA: Diagnosis not present

## 2023-05-18 DIAGNOSIS — K921 Melena: Secondary | ICD-10-CM | POA: Diagnosis not present

## 2023-05-18 DIAGNOSIS — R1031 Right lower quadrant pain: Secondary | ICD-10-CM | POA: Diagnosis not present

## 2023-05-18 DIAGNOSIS — R4182 Altered mental status, unspecified: Secondary | ICD-10-CM | POA: Diagnosis not present

## 2023-05-18 DIAGNOSIS — D72828 Other elevated white blood cell count: Secondary | ICD-10-CM | POA: Diagnosis not present

## 2023-05-18 DIAGNOSIS — I10 Essential (primary) hypertension: Secondary | ICD-10-CM | POA: Diagnosis not present

## 2023-05-18 DIAGNOSIS — D62 Acute posthemorrhagic anemia: Secondary | ICD-10-CM | POA: Diagnosis not present

## 2023-05-18 DIAGNOSIS — K259 Gastric ulcer, unspecified as acute or chronic, without hemorrhage or perforation: Secondary | ICD-10-CM | POA: Diagnosis not present

## 2023-05-18 DIAGNOSIS — F17200 Nicotine dependence, unspecified, uncomplicated: Secondary | ICD-10-CM | POA: Diagnosis not present

## 2023-05-18 DIAGNOSIS — D649 Anemia, unspecified: Secondary | ICD-10-CM | POA: Diagnosis not present

## 2023-05-18 DIAGNOSIS — R1032 Left lower quadrant pain: Secondary | ICD-10-CM | POA: Diagnosis not present

## 2023-05-18 DIAGNOSIS — J449 Chronic obstructive pulmonary disease, unspecified: Secondary | ICD-10-CM | POA: Diagnosis not present

## 2023-05-18 DIAGNOSIS — I9581 Postprocedural hypotension: Secondary | ICD-10-CM | POA: Diagnosis not present

## 2023-05-18 DIAGNOSIS — J4489 Other specified chronic obstructive pulmonary disease: Secondary | ICD-10-CM | POA: Diagnosis not present

## 2023-05-18 DIAGNOSIS — R110 Nausea: Secondary | ICD-10-CM | POA: Diagnosis not present

## 2023-05-18 DIAGNOSIS — R0602 Shortness of breath: Secondary | ICD-10-CM | POA: Diagnosis not present

## 2023-05-18 DIAGNOSIS — G319 Degenerative disease of nervous system, unspecified: Secondary | ICD-10-CM | POA: Diagnosis not present

## 2023-05-18 DIAGNOSIS — R0989 Other specified symptoms and signs involving the circulatory and respiratory systems: Secondary | ICD-10-CM | POA: Diagnosis not present

## 2023-05-18 DIAGNOSIS — R579 Shock, unspecified: Secondary | ICD-10-CM | POA: Diagnosis not present

## 2023-05-18 DIAGNOSIS — I5031 Acute diastolic (congestive) heart failure: Secondary | ICD-10-CM | POA: Diagnosis not present

## 2023-05-18 DIAGNOSIS — I2510 Atherosclerotic heart disease of native coronary artery without angina pectoris: Secondary | ICD-10-CM | POA: Diagnosis not present

## 2023-05-18 DIAGNOSIS — N179 Acute kidney failure, unspecified: Secondary | ICD-10-CM | POA: Diagnosis not present

## 2023-05-18 DIAGNOSIS — R9082 White matter disease, unspecified: Secondary | ICD-10-CM | POA: Diagnosis not present

## 2023-05-18 DIAGNOSIS — Z955 Presence of coronary angioplasty implant and graft: Secondary | ICD-10-CM | POA: Diagnosis not present

## 2023-05-18 DIAGNOSIS — R918 Other nonspecific abnormal finding of lung field: Secondary | ICD-10-CM | POA: Diagnosis not present

## 2023-05-18 DIAGNOSIS — I708 Atherosclerosis of other arteries: Secondary | ICD-10-CM | POA: Diagnosis not present

## 2023-05-18 DIAGNOSIS — I4891 Unspecified atrial fibrillation: Secondary | ICD-10-CM | POA: Diagnosis not present

## 2023-05-18 DIAGNOSIS — I2111 ST elevation (STEMI) myocardial infarction involving right coronary artery: Secondary | ICD-10-CM | POA: Diagnosis not present

## 2023-05-18 DIAGNOSIS — I480 Paroxysmal atrial fibrillation: Secondary | ICD-10-CM | POA: Diagnosis not present

## 2023-05-18 DIAGNOSIS — K5731 Diverticulosis of large intestine without perforation or abscess with bleeding: Secondary | ICD-10-CM | POA: Diagnosis not present

## 2023-05-18 DIAGNOSIS — Z7901 Long term (current) use of anticoagulants: Secondary | ICD-10-CM | POA: Diagnosis not present

## 2023-05-18 DIAGNOSIS — K6389 Other specified diseases of intestine: Secondary | ICD-10-CM | POA: Diagnosis not present

## 2023-05-19 DIAGNOSIS — D62 Acute posthemorrhagic anemia: Secondary | ICD-10-CM | POA: Diagnosis not present

## 2023-05-19 DIAGNOSIS — I517 Cardiomegaly: Secondary | ICD-10-CM | POA: Diagnosis not present

## 2023-05-19 DIAGNOSIS — R1032 Left lower quadrant pain: Secondary | ICD-10-CM | POA: Diagnosis not present

## 2023-05-19 DIAGNOSIS — Z7902 Long term (current) use of antithrombotics/antiplatelets: Secondary | ICD-10-CM | POA: Diagnosis not present

## 2023-05-19 DIAGNOSIS — K625 Hemorrhage of anus and rectum: Secondary | ICD-10-CM | POA: Diagnosis not present

## 2023-05-19 DIAGNOSIS — I2510 Atherosclerotic heart disease of native coronary artery without angina pectoris: Secondary | ICD-10-CM | POA: Diagnosis not present

## 2023-05-19 DIAGNOSIS — N179 Acute kidney failure, unspecified: Secondary | ICD-10-CM | POA: Diagnosis not present

## 2023-05-19 DIAGNOSIS — R110 Nausea: Secondary | ICD-10-CM | POA: Diagnosis not present

## 2023-05-19 DIAGNOSIS — I739 Peripheral vascular disease, unspecified: Secondary | ICD-10-CM | POA: Diagnosis not present

## 2023-05-19 DIAGNOSIS — R579 Shock, unspecified: Secondary | ICD-10-CM | POA: Diagnosis not present

## 2023-05-20 ENCOUNTER — Telehealth: Payer: Self-pay

## 2023-05-20 DIAGNOSIS — Z7902 Long term (current) use of antithrombotics/antiplatelets: Secondary | ICD-10-CM | POA: Diagnosis not present

## 2023-05-20 DIAGNOSIS — R110 Nausea: Secondary | ICD-10-CM | POA: Diagnosis not present

## 2023-05-20 DIAGNOSIS — N179 Acute kidney failure, unspecified: Secondary | ICD-10-CM | POA: Diagnosis not present

## 2023-05-20 DIAGNOSIS — D62 Acute posthemorrhagic anemia: Secondary | ICD-10-CM | POA: Diagnosis not present

## 2023-05-20 DIAGNOSIS — K625 Hemorrhage of anus and rectum: Secondary | ICD-10-CM | POA: Diagnosis not present

## 2023-05-20 DIAGNOSIS — R579 Shock, unspecified: Secondary | ICD-10-CM | POA: Diagnosis not present

## 2023-05-20 DIAGNOSIS — I2510 Atherosclerotic heart disease of native coronary artery without angina pectoris: Secondary | ICD-10-CM | POA: Diagnosis not present

## 2023-05-20 DIAGNOSIS — R1032 Left lower quadrant pain: Secondary | ICD-10-CM | POA: Diagnosis not present

## 2023-05-20 NOTE — Telephone Encounter (Signed)
Opened in error Christopher Armstrong, CMA

## 2023-05-21 ENCOUNTER — Telehealth: Payer: Self-pay | Admitting: Family Medicine

## 2023-05-21 DIAGNOSIS — R1032 Left lower quadrant pain: Secondary | ICD-10-CM | POA: Diagnosis not present

## 2023-05-21 DIAGNOSIS — R579 Shock, unspecified: Secondary | ICD-10-CM | POA: Diagnosis not present

## 2023-05-21 DIAGNOSIS — N179 Acute kidney failure, unspecified: Secondary | ICD-10-CM | POA: Diagnosis not present

## 2023-05-21 DIAGNOSIS — Z7902 Long term (current) use of antithrombotics/antiplatelets: Secondary | ICD-10-CM | POA: Diagnosis not present

## 2023-05-21 DIAGNOSIS — I2510 Atherosclerotic heart disease of native coronary artery without angina pectoris: Secondary | ICD-10-CM | POA: Diagnosis not present

## 2023-05-21 DIAGNOSIS — D62 Acute posthemorrhagic anemia: Secondary | ICD-10-CM | POA: Diagnosis not present

## 2023-05-21 NOTE — Telephone Encounter (Signed)
Called in requesting an update on Ensure application through [facility]. Please advise

## 2023-05-22 DIAGNOSIS — N179 Acute kidney failure, unspecified: Secondary | ICD-10-CM | POA: Diagnosis not present

## 2023-05-22 DIAGNOSIS — I5031 Acute diastolic (congestive) heart failure: Secondary | ICD-10-CM | POA: Diagnosis not present

## 2023-05-22 DIAGNOSIS — D62 Acute posthemorrhagic anemia: Secondary | ICD-10-CM | POA: Diagnosis not present

## 2023-05-22 DIAGNOSIS — J9602 Acute respiratory failure with hypercapnia: Secondary | ICD-10-CM | POA: Diagnosis not present

## 2023-05-22 DIAGNOSIS — I479 Paroxysmal tachycardia, unspecified: Secondary | ICD-10-CM | POA: Diagnosis not present

## 2023-05-22 DIAGNOSIS — Z9981 Dependence on supplemental oxygen: Secondary | ICD-10-CM | POA: Diagnosis not present

## 2023-05-22 DIAGNOSIS — I059 Rheumatic mitral valve disease, unspecified: Secondary | ICD-10-CM | POA: Diagnosis not present

## 2023-05-22 DIAGNOSIS — R579 Shock, unspecified: Secondary | ICD-10-CM | POA: Diagnosis not present

## 2023-05-22 DIAGNOSIS — R918 Other nonspecific abnormal finding of lung field: Secondary | ICD-10-CM | POA: Diagnosis not present

## 2023-05-23 DIAGNOSIS — J9602 Acute respiratory failure with hypercapnia: Secondary | ICD-10-CM | POA: Diagnosis not present

## 2023-05-23 DIAGNOSIS — Z9981 Dependence on supplemental oxygen: Secondary | ICD-10-CM | POA: Diagnosis not present

## 2023-05-23 DIAGNOSIS — I479 Paroxysmal tachycardia, unspecified: Secondary | ICD-10-CM | POA: Diagnosis not present

## 2023-05-23 DIAGNOSIS — I2510 Atherosclerotic heart disease of native coronary artery without angina pectoris: Secondary | ICD-10-CM | POA: Diagnosis not present

## 2023-05-23 DIAGNOSIS — I5031 Acute diastolic (congestive) heart failure: Secondary | ICD-10-CM | POA: Diagnosis not present

## 2023-05-23 DIAGNOSIS — D62 Acute posthemorrhagic anemia: Secondary | ICD-10-CM | POA: Diagnosis not present

## 2023-05-24 DIAGNOSIS — Z9981 Dependence on supplemental oxygen: Secondary | ICD-10-CM | POA: Diagnosis not present

## 2023-05-24 DIAGNOSIS — Z7902 Long term (current) use of antithrombotics/antiplatelets: Secondary | ICD-10-CM | POA: Diagnosis not present

## 2023-05-24 DIAGNOSIS — I479 Paroxysmal tachycardia, unspecified: Secondary | ICD-10-CM | POA: Diagnosis not present

## 2023-05-24 DIAGNOSIS — I2111 ST elevation (STEMI) myocardial infarction involving right coronary artery: Secondary | ICD-10-CM | POA: Diagnosis not present

## 2023-05-24 DIAGNOSIS — D62 Acute posthemorrhagic anemia: Secondary | ICD-10-CM | POA: Diagnosis not present

## 2023-05-24 DIAGNOSIS — I2510 Atherosclerotic heart disease of native coronary artery without angina pectoris: Secondary | ICD-10-CM | POA: Diagnosis not present

## 2023-05-25 DIAGNOSIS — R059 Cough, unspecified: Secondary | ICD-10-CM | POA: Diagnosis not present

## 2023-05-25 DIAGNOSIS — R918 Other nonspecific abnormal finding of lung field: Secondary | ICD-10-CM | POA: Diagnosis not present

## 2023-05-25 DIAGNOSIS — I2111 ST elevation (STEMI) myocardial infarction involving right coronary artery: Secondary | ICD-10-CM | POA: Diagnosis not present

## 2023-05-25 DIAGNOSIS — R0602 Shortness of breath: Secondary | ICD-10-CM | POA: Diagnosis not present

## 2023-05-26 DIAGNOSIS — I2111 ST elevation (STEMI) myocardial infarction involving right coronary artery: Secondary | ICD-10-CM | POA: Diagnosis not present

## 2023-05-27 DIAGNOSIS — I2111 ST elevation (STEMI) myocardial infarction involving right coronary artery: Secondary | ICD-10-CM | POA: Diagnosis not present

## 2023-05-27 DIAGNOSIS — R0602 Shortness of breath: Secondary | ICD-10-CM | POA: Diagnosis not present

## 2023-05-28 DIAGNOSIS — I2510 Atherosclerotic heart disease of native coronary artery without angina pectoris: Secondary | ICD-10-CM | POA: Diagnosis not present

## 2023-05-28 DIAGNOSIS — R059 Cough, unspecified: Secondary | ICD-10-CM | POA: Diagnosis not present

## 2023-05-29 DIAGNOSIS — I2111 ST elevation (STEMI) myocardial infarction involving right coronary artery: Secondary | ICD-10-CM | POA: Diagnosis not present

## 2023-05-29 NOTE — Telephone Encounter (Signed)
Christopher Armstrong was seen in office. Contacted DME concerning Ensure

## 2023-05-30 DIAGNOSIS — I2111 ST elevation (STEMI) myocardial infarction involving right coronary artery: Secondary | ICD-10-CM | POA: Diagnosis not present

## 2023-06-01 DIAGNOSIS — J90 Pleural effusion, not elsewhere classified: Secondary | ICD-10-CM | POA: Diagnosis not present

## 2023-06-01 DIAGNOSIS — R918 Other nonspecific abnormal finding of lung field: Secondary | ICD-10-CM | POA: Diagnosis not present

## 2023-06-03 DIAGNOSIS — I2111 ST elevation (STEMI) myocardial infarction involving right coronary artery: Secondary | ICD-10-CM | POA: Diagnosis not present

## 2023-06-04 DIAGNOSIS — I10 Essential (primary) hypertension: Secondary | ICD-10-CM | POA: Diagnosis not present

## 2023-06-04 DIAGNOSIS — J9611 Chronic respiratory failure with hypoxia: Secondary | ICD-10-CM | POA: Diagnosis not present

## 2023-06-04 DIAGNOSIS — I2111 ST elevation (STEMI) myocardial infarction involving right coronary artery: Secondary | ICD-10-CM | POA: Diagnosis not present

## 2023-06-04 DIAGNOSIS — I2510 Atherosclerotic heart disease of native coronary artery without angina pectoris: Secondary | ICD-10-CM | POA: Diagnosis not present

## 2023-06-04 DIAGNOSIS — I480 Paroxysmal atrial fibrillation: Secondary | ICD-10-CM | POA: Diagnosis not present

## 2023-06-04 DIAGNOSIS — J9601 Acute respiratory failure with hypoxia: Secondary | ICD-10-CM | POA: Diagnosis not present

## 2023-06-05 DIAGNOSIS — J9611 Chronic respiratory failure with hypoxia: Secondary | ICD-10-CM | POA: Diagnosis not present

## 2023-06-05 DIAGNOSIS — I10 Essential (primary) hypertension: Secondary | ICD-10-CM | POA: Diagnosis not present

## 2023-06-05 DIAGNOSIS — J9601 Acute respiratory failure with hypoxia: Secondary | ICD-10-CM | POA: Diagnosis not present

## 2023-06-05 DIAGNOSIS — I480 Paroxysmal atrial fibrillation: Secondary | ICD-10-CM | POA: Diagnosis not present

## 2023-06-06 DIAGNOSIS — J9601 Acute respiratory failure with hypoxia: Secondary | ICD-10-CM | POA: Diagnosis not present

## 2023-06-06 DIAGNOSIS — R0602 Shortness of breath: Secondary | ICD-10-CM | POA: Diagnosis not present

## 2023-06-06 DIAGNOSIS — I10 Essential (primary) hypertension: Secondary | ICD-10-CM | POA: Diagnosis not present

## 2023-06-06 DIAGNOSIS — J9611 Chronic respiratory failure with hypoxia: Secondary | ICD-10-CM | POA: Diagnosis not present

## 2023-06-06 DIAGNOSIS — I480 Paroxysmal atrial fibrillation: Secondary | ICD-10-CM | POA: Diagnosis not present

## 2023-06-07 DIAGNOSIS — K254 Chronic or unspecified gastric ulcer with hemorrhage: Secondary | ICD-10-CM | POA: Diagnosis not present

## 2023-06-07 DIAGNOSIS — I10 Essential (primary) hypertension: Secondary | ICD-10-CM | POA: Diagnosis not present

## 2023-06-07 DIAGNOSIS — J9611 Chronic respiratory failure with hypoxia: Secondary | ICD-10-CM | POA: Diagnosis not present

## 2023-06-07 DIAGNOSIS — J9601 Acute respiratory failure with hypoxia: Secondary | ICD-10-CM | POA: Diagnosis not present

## 2023-06-07 DIAGNOSIS — I2510 Atherosclerotic heart disease of native coronary artery without angina pectoris: Secondary | ICD-10-CM | POA: Diagnosis not present

## 2023-06-07 DIAGNOSIS — D62 Acute posthemorrhagic anemia: Secondary | ICD-10-CM | POA: Diagnosis not present

## 2023-06-07 DIAGNOSIS — I480 Paroxysmal atrial fibrillation: Secondary | ICD-10-CM | POA: Diagnosis not present

## 2023-06-08 DIAGNOSIS — D62 Acute posthemorrhagic anemia: Secondary | ICD-10-CM | POA: Diagnosis not present

## 2023-06-08 DIAGNOSIS — I2510 Atherosclerotic heart disease of native coronary artery without angina pectoris: Secondary | ICD-10-CM | POA: Diagnosis not present

## 2023-06-08 DIAGNOSIS — I10 Essential (primary) hypertension: Secondary | ICD-10-CM | POA: Diagnosis not present

## 2023-06-08 DIAGNOSIS — K625 Hemorrhage of anus and rectum: Secondary | ICD-10-CM | POA: Diagnosis not present

## 2023-06-08 DIAGNOSIS — K254 Chronic or unspecified gastric ulcer with hemorrhage: Secondary | ICD-10-CM | POA: Diagnosis not present

## 2023-06-08 DIAGNOSIS — J9601 Acute respiratory failure with hypoxia: Secondary | ICD-10-CM | POA: Diagnosis not present

## 2023-06-08 DIAGNOSIS — B3781 Candidal esophagitis: Secondary | ICD-10-CM | POA: Diagnosis not present

## 2023-06-08 DIAGNOSIS — J9611 Chronic respiratory failure with hypoxia: Secondary | ICD-10-CM | POA: Diagnosis not present

## 2023-06-08 DIAGNOSIS — I480 Paroxysmal atrial fibrillation: Secondary | ICD-10-CM | POA: Diagnosis not present

## 2023-06-08 DIAGNOSIS — K222 Esophageal obstruction: Secondary | ICD-10-CM | POA: Diagnosis not present

## 2023-06-08 DIAGNOSIS — K257 Chronic gastric ulcer without hemorrhage or perforation: Secondary | ICD-10-CM | POA: Diagnosis not present

## 2023-06-09 DIAGNOSIS — K625 Hemorrhage of anus and rectum: Secondary | ICD-10-CM | POA: Diagnosis not present

## 2023-06-09 DIAGNOSIS — J9611 Chronic respiratory failure with hypoxia: Secondary | ICD-10-CM | POA: Diagnosis not present

## 2023-06-09 DIAGNOSIS — B3781 Candidal esophagitis: Secondary | ICD-10-CM | POA: Diagnosis not present

## 2023-06-09 DIAGNOSIS — I2510 Atherosclerotic heart disease of native coronary artery without angina pectoris: Secondary | ICD-10-CM | POA: Diagnosis not present

## 2023-06-09 DIAGNOSIS — D62 Acute posthemorrhagic anemia: Secondary | ICD-10-CM | POA: Diagnosis not present

## 2023-06-09 DIAGNOSIS — J9601 Acute respiratory failure with hypoxia: Secondary | ICD-10-CM | POA: Diagnosis not present

## 2023-06-09 DIAGNOSIS — K222 Esophageal obstruction: Secondary | ICD-10-CM | POA: Diagnosis not present

## 2023-06-09 DIAGNOSIS — I10 Essential (primary) hypertension: Secondary | ICD-10-CM | POA: Diagnosis not present

## 2023-06-09 DIAGNOSIS — K257 Chronic gastric ulcer without hemorrhage or perforation: Secondary | ICD-10-CM | POA: Diagnosis not present

## 2023-06-09 DIAGNOSIS — K254 Chronic or unspecified gastric ulcer with hemorrhage: Secondary | ICD-10-CM | POA: Diagnosis not present

## 2023-06-10 DIAGNOSIS — I252 Old myocardial infarction: Secondary | ICD-10-CM | POA: Diagnosis not present

## 2023-06-10 DIAGNOSIS — K257 Chronic gastric ulcer without hemorrhage or perforation: Secondary | ICD-10-CM | POA: Diagnosis not present

## 2023-06-10 DIAGNOSIS — I480 Paroxysmal atrial fibrillation: Secondary | ICD-10-CM | POA: Diagnosis not present

## 2023-06-10 DIAGNOSIS — D62 Acute posthemorrhagic anemia: Secondary | ICD-10-CM | POA: Diagnosis not present

## 2023-06-10 DIAGNOSIS — J449 Chronic obstructive pulmonary disease, unspecified: Secondary | ICD-10-CM | POA: Diagnosis not present

## 2023-06-10 DIAGNOSIS — K219 Gastro-esophageal reflux disease without esophagitis: Secondary | ICD-10-CM | POA: Diagnosis not present

## 2023-06-10 DIAGNOSIS — K625 Hemorrhage of anus and rectum: Secondary | ICD-10-CM | POA: Diagnosis not present

## 2023-06-10 DIAGNOSIS — K222 Esophageal obstruction: Secondary | ICD-10-CM | POA: Diagnosis not present

## 2023-06-10 DIAGNOSIS — B3781 Candidal esophagitis: Secondary | ICD-10-CM | POA: Diagnosis not present

## 2023-06-10 DIAGNOSIS — J9601 Acute respiratory failure with hypoxia: Secondary | ICD-10-CM | POA: Diagnosis not present

## 2023-06-10 DIAGNOSIS — K254 Chronic or unspecified gastric ulcer with hemorrhage: Secondary | ICD-10-CM | POA: Diagnosis not present

## 2023-06-10 DIAGNOSIS — I2510 Atherosclerotic heart disease of native coronary artery without angina pectoris: Secondary | ICD-10-CM | POA: Diagnosis not present

## 2023-06-10 DIAGNOSIS — I10 Essential (primary) hypertension: Secondary | ICD-10-CM | POA: Diagnosis not present

## 2023-06-11 DIAGNOSIS — B3781 Candidal esophagitis: Secondary | ICD-10-CM | POA: Diagnosis not present

## 2023-06-11 DIAGNOSIS — K222 Esophageal obstruction: Secondary | ICD-10-CM | POA: Diagnosis not present

## 2023-06-11 DIAGNOSIS — I2111 ST elevation (STEMI) myocardial infarction involving right coronary artery: Secondary | ICD-10-CM | POA: Diagnosis not present

## 2023-06-11 DIAGNOSIS — D62 Acute posthemorrhagic anemia: Secondary | ICD-10-CM | POA: Diagnosis not present

## 2023-06-11 DIAGNOSIS — K625 Hemorrhage of anus and rectum: Secondary | ICD-10-CM | POA: Diagnosis not present

## 2023-06-11 DIAGNOSIS — K257 Chronic gastric ulcer without hemorrhage or perforation: Secondary | ICD-10-CM | POA: Diagnosis not present

## 2023-06-12 DIAGNOSIS — R0602 Shortness of breath: Secondary | ICD-10-CM | POA: Diagnosis not present

## 2023-06-12 DIAGNOSIS — K222 Esophageal obstruction: Secondary | ICD-10-CM | POA: Diagnosis not present

## 2023-06-12 DIAGNOSIS — R918 Other nonspecific abnormal finding of lung field: Secondary | ICD-10-CM | POA: Diagnosis not present

## 2023-06-12 DIAGNOSIS — F419 Anxiety disorder, unspecified: Secondary | ICD-10-CM | POA: Diagnosis not present

## 2023-06-12 DIAGNOSIS — R5381 Other malaise: Secondary | ICD-10-CM | POA: Diagnosis not present

## 2023-06-12 DIAGNOSIS — I2111 ST elevation (STEMI) myocardial infarction involving right coronary artery: Secondary | ICD-10-CM | POA: Diagnosis not present

## 2023-06-12 DIAGNOSIS — Z515 Encounter for palliative care: Secondary | ICD-10-CM | POA: Diagnosis not present

## 2023-06-12 DIAGNOSIS — D62 Acute posthemorrhagic anemia: Secondary | ICD-10-CM | POA: Diagnosis not present

## 2023-06-12 DIAGNOSIS — K257 Chronic gastric ulcer without hemorrhage or perforation: Secondary | ICD-10-CM | POA: Diagnosis not present

## 2023-06-12 DIAGNOSIS — B3781 Candidal esophagitis: Secondary | ICD-10-CM | POA: Diagnosis not present

## 2023-06-12 DIAGNOSIS — R0600 Dyspnea, unspecified: Secondary | ICD-10-CM | POA: Diagnosis not present

## 2023-06-12 DIAGNOSIS — R197 Diarrhea, unspecified: Secondary | ICD-10-CM | POA: Diagnosis not present

## 2023-06-12 DIAGNOSIS — K625 Hemorrhage of anus and rectum: Secondary | ICD-10-CM | POA: Diagnosis not present

## 2023-06-12 DIAGNOSIS — I2510 Atherosclerotic heart disease of native coronary artery without angina pectoris: Secondary | ICD-10-CM | POA: Diagnosis not present

## 2023-06-13 DIAGNOSIS — I2111 ST elevation (STEMI) myocardial infarction involving right coronary artery: Secondary | ICD-10-CM | POA: Diagnosis not present

## 2023-06-13 DIAGNOSIS — J849 Interstitial pulmonary disease, unspecified: Secondary | ICD-10-CM | POA: Diagnosis not present

## 2023-06-13 DIAGNOSIS — J439 Emphysema, unspecified: Secondary | ICD-10-CM | POA: Diagnosis not present

## 2023-06-14 DIAGNOSIS — I2111 ST elevation (STEMI) myocardial infarction involving right coronary artery: Secondary | ICD-10-CM | POA: Diagnosis not present

## 2023-06-14 DIAGNOSIS — K769 Liver disease, unspecified: Secondary | ICD-10-CM | POA: Diagnosis not present

## 2023-06-15 DIAGNOSIS — K922 Gastrointestinal hemorrhage, unspecified: Secondary | ICD-10-CM | POA: Diagnosis not present

## 2023-06-15 DIAGNOSIS — R600 Localized edema: Secondary | ICD-10-CM | POA: Diagnosis not present

## 2023-06-15 DIAGNOSIS — D500 Iron deficiency anemia secondary to blood loss (chronic): Secondary | ICD-10-CM | POA: Diagnosis not present

## 2023-06-15 DIAGNOSIS — D696 Thrombocytopenia, unspecified: Secondary | ICD-10-CM | POA: Diagnosis not present

## 2023-06-15 DIAGNOSIS — I480 Paroxysmal atrial fibrillation: Secondary | ICD-10-CM | POA: Diagnosis not present

## 2023-06-15 DIAGNOSIS — Z7189 Other specified counseling: Secondary | ICD-10-CM | POA: Diagnosis not present

## 2023-06-15 DIAGNOSIS — Z515 Encounter for palliative care: Secondary | ICD-10-CM | POA: Diagnosis not present

## 2023-06-18 ENCOUNTER — Telehealth: Payer: Self-pay | Admitting: Pharmacist

## 2023-06-18 ENCOUNTER — Other Ambulatory Visit: Payer: Medicare (Managed Care) | Admitting: Pharmacist

## 2023-06-18 DIAGNOSIS — K254 Chronic or unspecified gastric ulcer with hemorrhage: Secondary | ICD-10-CM | POA: Diagnosis not present

## 2023-06-18 DIAGNOSIS — I2119 ST elevation (STEMI) myocardial infarction involving other coronary artery of inferior wall: Secondary | ICD-10-CM | POA: Diagnosis not present

## 2023-06-18 DIAGNOSIS — D649 Anemia, unspecified: Secondary | ICD-10-CM | POA: Diagnosis not present

## 2023-06-18 DIAGNOSIS — I2111 ST elevation (STEMI) myocardial infarction involving right coronary artery: Secondary | ICD-10-CM | POA: Diagnosis not present

## 2023-06-18 DIAGNOSIS — D7289 Other specified disorders of white blood cells: Secondary | ICD-10-CM | POA: Diagnosis not present

## 2023-06-18 DIAGNOSIS — D696 Thrombocytopenia, unspecified: Secondary | ICD-10-CM | POA: Diagnosis not present

## 2023-06-18 DIAGNOSIS — A419 Sepsis, unspecified organism: Secondary | ICD-10-CM | POA: Diagnosis not present

## 2023-06-18 DIAGNOSIS — K264 Chronic or unspecified duodenal ulcer with hemorrhage: Secondary | ICD-10-CM | POA: Diagnosis not present

## 2023-06-18 NOTE — Progress Notes (Unsigned)
   06/18/2023 Name: Christopher Armstrong MRN: [id number] DOB: 12-Dec-1940  No chief complaint on file.   Christopher Armstrong is a 82 y.o. year old male who presented for a telephone visit.   They were referred to the pharmacist by their Case Management Team  for assistance in managing medication access.    Subjective:  Care Team: Primary Care Provider: Everrett Coombe, DO ;  Medication Access/Adherence  Current Pharmacy:  [facility] [address], [place] - [id number] [facility] [address] [place] [address] Phone: [phone] Fax: [phone]  [facility] - [place], [place] - [address] [facility] [address] [employer] [place] [address] Phone: [phone] Fax: [phone]  [id number] - [place], [place] - [address] [facility] [address] [place] [address] Phone: [phone] Fax: [phone]  [facility] - [place], [place] - [address] [place] [address] Phone: [phone] Fax: [phone]   Patient reports affordability concerns with their medications: Yes   COPD:  Current medications: Breztri via patient assistance, going well    Current medication access support: Breztri via [employer]   Objective:  No results found for: "HGBA1C"  Lab Results  Component Value Date   CREATININE 0.90 05/09/2020   BUN 14 05/09/2020   NA 140 05/09/2020   K 4.0 05/09/2020   CL 102 05/09/2020   CO2 28 05/09/2020     Medications Reviewed Today   Medications were not reviewed in this encounter       Assessment/Plan:   COPD: - Currently controlled, all is going well at this time - Recommend continue current regimen - [name] is approved through manufacturer patient assistance for 2024 and will be ongoing.  Follow Up Plan: 3-4 months  Lynnda Shields, PharmD Clinical  Pharmacist [facility] [phone]

## 2023-06-18 NOTE — Progress Notes (Signed)
Attempted to contact patient for scheduled appointment for medication management. Contacted sister, Elnita Maxwell. Patient is in hospital and pending SNF placement.  Did not reschedule any follow up at this time.   Lynnda Shields, PharmD, BCPS Clinical Pharmacist [facility]

## 2023-06-19 DIAGNOSIS — I2111 ST elevation (STEMI) myocardial infarction involving right coronary artery: Secondary | ICD-10-CM | POA: Diagnosis not present

## 2023-06-20 DIAGNOSIS — I2111 ST elevation (STEMI) myocardial infarction involving right coronary artery: Secondary | ICD-10-CM | POA: Diagnosis not present

## 2023-06-20 DIAGNOSIS — K6389 Other specified diseases of intestine: Secondary | ICD-10-CM | POA: Diagnosis not present

## 2023-06-20 DIAGNOSIS — K5900 Constipation, unspecified: Secondary | ICD-10-CM | POA: Diagnosis not present

## 2023-06-21 DIAGNOSIS — N400 Enlarged prostate without lower urinary tract symptoms: Secondary | ICD-10-CM | POA: Diagnosis not present

## 2023-06-21 DIAGNOSIS — Z7901 Long term (current) use of anticoagulants: Secondary | ICD-10-CM | POA: Diagnosis not present

## 2023-06-21 DIAGNOSIS — Z515 Encounter for palliative care: Secondary | ICD-10-CM | POA: Diagnosis not present

## 2023-06-21 DIAGNOSIS — G8929 Other chronic pain: Secondary | ICD-10-CM | POA: Diagnosis not present

## 2023-06-21 DIAGNOSIS — F03A0 Unspecified dementia, mild, without behavioral disturbance, psychotic disturbance, mood disturbance, and anxiety: Secondary | ICD-10-CM | POA: Diagnosis not present

## 2023-06-21 DIAGNOSIS — I2510 Atherosclerotic heart disease of native coronary artery without angina pectoris: Secondary | ICD-10-CM | POA: Diagnosis not present

## 2023-06-21 DIAGNOSIS — I2111 ST elevation (STEMI) myocardial infarction involving right coronary artery: Secondary | ICD-10-CM | POA: Diagnosis not present

## 2023-06-21 DIAGNOSIS — I5032 Chronic diastolic (congestive) heart failure: Secondary | ICD-10-CM | POA: Diagnosis not present

## 2023-06-21 DIAGNOSIS — I959 Hypotension, unspecified: Secondary | ICD-10-CM | POA: Diagnosis not present

## 2023-06-21 DIAGNOSIS — I480 Paroxysmal atrial fibrillation: Secondary | ICD-10-CM | POA: Diagnosis not present

## 2023-06-21 DIAGNOSIS — E785 Hyperlipidemia, unspecified: Secondary | ICD-10-CM | POA: Diagnosis not present

## 2023-06-21 DIAGNOSIS — Z955 Presence of coronary angioplasty implant and graft: Secondary | ICD-10-CM | POA: Diagnosis not present

## 2023-06-21 DIAGNOSIS — K5900 Constipation, unspecified: Secondary | ICD-10-CM | POA: Diagnosis not present

## 2023-06-21 DIAGNOSIS — F17210 Nicotine dependence, cigarettes, uncomplicated: Secondary | ICD-10-CM | POA: Diagnosis not present

## 2023-06-21 DIAGNOSIS — B351 Tinea unguium: Secondary | ICD-10-CM | POA: Diagnosis not present

## 2023-06-21 DIAGNOSIS — R609 Edema, unspecified: Secondary | ICD-10-CM | POA: Diagnosis not present

## 2023-06-21 DIAGNOSIS — R1314 Dysphagia, pharyngoesophageal phase: Secondary | ICD-10-CM | POA: Diagnosis not present

## 2023-06-21 DIAGNOSIS — L89152 Pressure ulcer of sacral region, stage 2: Secondary | ICD-10-CM | POA: Diagnosis not present

## 2023-06-21 DIAGNOSIS — J9611 Chronic respiratory failure with hypoxia: Secondary | ICD-10-CM | POA: Diagnosis not present

## 2023-06-21 DIAGNOSIS — I10 Essential (primary) hypertension: Secondary | ICD-10-CM | POA: Diagnosis not present

## 2023-06-21 DIAGNOSIS — I4891 Unspecified atrial fibrillation: Secondary | ICD-10-CM | POA: Diagnosis not present

## 2023-06-21 DIAGNOSIS — I7091 Generalized atherosclerosis: Secondary | ICD-10-CM | POA: Diagnosis not present

## 2023-06-21 DIAGNOSIS — J449 Chronic obstructive pulmonary disease, unspecified: Secondary | ICD-10-CM | POA: Diagnosis not present

## 2023-06-21 DIAGNOSIS — D649 Anemia, unspecified: Secondary | ICD-10-CM | POA: Diagnosis not present

## 2023-06-25 DIAGNOSIS — L89152 Pressure ulcer of sacral region, stage 2: Secondary | ICD-10-CM | POA: Diagnosis not present

## 2023-06-28 DIAGNOSIS — R609 Edema, unspecified: Secondary | ICD-10-CM | POA: Diagnosis not present

## 2023-06-28 DIAGNOSIS — I10 Essential (primary) hypertension: Secondary | ICD-10-CM | POA: Diagnosis not present

## 2023-06-28 DIAGNOSIS — J449 Chronic obstructive pulmonary disease, unspecified: Secondary | ICD-10-CM | POA: Diagnosis not present

## 2023-06-28 DIAGNOSIS — G8929 Other chronic pain: Secondary | ICD-10-CM | POA: Diagnosis not present

## 2023-06-28 DIAGNOSIS — Z515 Encounter for palliative care: Secondary | ICD-10-CM | POA: Diagnosis not present

## 2023-06-28 DIAGNOSIS — I4891 Unspecified atrial fibrillation: Secondary | ICD-10-CM | POA: Diagnosis not present

## 2023-06-30 DIAGNOSIS — J42 Unspecified chronic bronchitis: Secondary | ICD-10-CM | POA: Diagnosis not present

## 2023-06-30 DIAGNOSIS — J449 Chronic obstructive pulmonary disease, unspecified: Secondary | ICD-10-CM | POA: Diagnosis not present

## 2023-07-01 ENCOUNTER — Other Ambulatory Visit: Payer: Self-pay | Admitting: Family Medicine

## 2023-07-01 DIAGNOSIS — K5900 Constipation, unspecified: Secondary | ICD-10-CM | POA: Diagnosis not present

## 2023-07-01 DIAGNOSIS — D649 Anemia, unspecified: Secondary | ICD-10-CM | POA: Diagnosis not present

## 2023-07-01 DIAGNOSIS — F17210 Nicotine dependence, cigarettes, uncomplicated: Secondary | ICD-10-CM | POA: Diagnosis not present

## 2023-07-01 DIAGNOSIS — J4489 Other specified chronic obstructive pulmonary disease: Secondary | ICD-10-CM

## 2023-07-01 DIAGNOSIS — I959 Hypotension, unspecified: Secondary | ICD-10-CM | POA: Diagnosis not present

## 2023-07-02 DIAGNOSIS — B351 Tinea unguium: Secondary | ICD-10-CM | POA: Diagnosis not present

## 2023-07-02 DIAGNOSIS — I7091 Generalized atherosclerosis: Secondary | ICD-10-CM | POA: Diagnosis not present

## 2023-07-03 DIAGNOSIS — G8929 Other chronic pain: Secondary | ICD-10-CM | POA: Diagnosis not present

## 2023-07-03 DIAGNOSIS — J449 Chronic obstructive pulmonary disease, unspecified: Secondary | ICD-10-CM | POA: Diagnosis not present

## 2023-07-03 DIAGNOSIS — I959 Hypotension, unspecified: Secondary | ICD-10-CM | POA: Diagnosis not present

## 2023-07-03 DIAGNOSIS — F03A0 Unspecified dementia, mild, without behavioral disturbance, psychotic disturbance, mood disturbance, and anxiety: Secondary | ICD-10-CM | POA: Diagnosis not present

## 2023-07-05 ENCOUNTER — Telehealth: Payer: Self-pay

## 2023-07-05 ENCOUNTER — Other Ambulatory Visit: Payer: Self-pay

## 2023-07-05 ENCOUNTER — Other Ambulatory Visit: Payer: Medicare (Managed Care)

## 2023-07-05 NOTE — Patient Outreach (Signed)
Care Management   Visit Note  07/05/2023 Name: Christopher Armstrong MRN: [id number] DOB: Jan 07, 1941  Subjective: Christopher Armstrong is a 82 y.o. year old male who is a primary care patient of Everrett Coombe, DO. The Care Management team was consulted for assistance.      Engaged with patient spoke with the family member (POA, [place], [place]). Spoke to the patients sister. The patient is in Hospice Care. Care Management program closed.   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 The patient went to rehab facility but did not do well. The patient has transitioned to [facility] in [place]. Spoke to his sister briefly today.  Care plans completed and care management services are cancelled at this time.     Consent to Services:  No further outreaches at this time. The patient has transitioned to [facility].   Plan: No further follow up required: the patient has transferred to [facility]  Alto Denver RN, MSN, CCM RN Care Manager  [facility]  Ambulatory Care Management  Direct Number: [phone]

## 2023-07-05 NOTE — Telephone Encounter (Signed)
Patient approved and enrolled until 10/15/23.

## 2023-07-05 NOTE — Telephone Encounter (Signed)
Panya-  Received message that patient has been moved to Hospice, can we confirm that he is still needing this rx?  Hospice may be taking over medications.    CM

## 2023-07-05 NOTE — Patient Instructions (Signed)
Visit Information  Thank you for taking time to visit with me today. Please don't hesitate to contact me if I can be of assistance to you before our next scheduled telephone appointment.  Following are the goals we discussed today:  Spoke briefly with the patients sister. The patient is now with Hospice Services in [place]     Please call the care guide team at [phone] if you need to cancel or reschedule your appointment.   If you are experiencing a Mental Health or Behavioral Health Crisis or need someone to talk to, please call the Suicide and Crisis Lifeline: 988 call the [place] National Suicide Prevention Lifeline: 8585571054 or TTY: 404-619-6284 TTY 605-337-6295) to talk to a trained counselor call 1-800-273-TALK (toll free, 24 hour hotline) go to [facility] [address] [place] [phone])   The patient verbalized understanding of instructions, educational materials, and care plan provided today and DECLINED offer to receive copy of patient instructions, educational materials, and care plan.   No further follow up required: the patient is now with hospice services, his sister will call the RNCM if things change  Alto Denver RN, MSN, CCM RN Care Manager  [facility]  Ambulatory Care Management  Direct Number: [phone]

## 2023-07-05 NOTE — Telephone Encounter (Signed)
Patient enrolled with [employer] patient assistance thru 10/15/23 for Breztri.  Rec'd fax from [employer]. Refill request for [employer].  Could a 90 day supply of medication (w/ refills) be e-scribed to [facility]?

## 2023-07-12 ENCOUNTER — Telehealth: Payer: Self-pay | Admitting: Family Medicine

## 2023-07-12 NOTE — Telephone Encounter (Signed)
Sister returned phone call stating that patient has passed on 07-26-2023.

## 2023-07-12 NOTE — Telephone Encounter (Signed)
Attempted to contact the patient to determine if med refills are required since patient moved to Hospice. LVM for return call.

## 2023-07-16 DEATH — deceased
